# Patient Record
Sex: Male | Born: 1945 | Race: White | Hispanic: No | Marital: Married | Smoking: Former smoker
Health system: Southern US, Community
[De-identification: ages and names within clinical notes are randomized; demographics above are authoritative.]

## PROBLEM LIST (undated history)

## (undated) DIAGNOSIS — M1990 Unspecified osteoarthritis, unspecified site: Secondary | ICD-10-CM

## (undated) DIAGNOSIS — K589 Irritable bowel syndrome without diarrhea: Secondary | ICD-10-CM

## (undated) DIAGNOSIS — M419 Scoliosis, unspecified: Secondary | ICD-10-CM

## (undated) DIAGNOSIS — N400 Enlarged prostate without lower urinary tract symptoms: Secondary | ICD-10-CM

## (undated) DIAGNOSIS — M4800 Spinal stenosis, site unspecified: Secondary | ICD-10-CM

## (undated) DIAGNOSIS — I10 Essential (primary) hypertension: Secondary | ICD-10-CM

## (undated) DIAGNOSIS — C49A0 Gastrointestinal stromal tumor, unspecified site: Secondary | ICD-10-CM

## (undated) DIAGNOSIS — G709 Myoneural disorder, unspecified: Secondary | ICD-10-CM

## (undated) DIAGNOSIS — Z981 Arthrodesis status: Secondary | ICD-10-CM

## (undated) DIAGNOSIS — C49A4 Gastrointestinal stromal tumor of large intestine: Secondary | ICD-10-CM

## (undated) DIAGNOSIS — R7303 Prediabetes: Secondary | ICD-10-CM

## (undated) DIAGNOSIS — K7689 Other specified diseases of liver: Secondary | ICD-10-CM

## (undated) DIAGNOSIS — Z8371 Family history of colonic polyps: Secondary | ICD-10-CM

## (undated) DIAGNOSIS — F32A Depression, unspecified: Secondary | ICD-10-CM

## (undated) DIAGNOSIS — F329 Major depressive disorder, single episode, unspecified: Secondary | ICD-10-CM

## (undated) DIAGNOSIS — Z8739 Personal history of other diseases of the musculoskeletal system and connective tissue: Secondary | ICD-10-CM

## (undated) DIAGNOSIS — M5136 Other intervertebral disc degeneration, lumbar region: Secondary | ICD-10-CM

## (undated) DIAGNOSIS — F419 Anxiety disorder, unspecified: Secondary | ICD-10-CM

## (undated) DIAGNOSIS — Z83719 Family history of colon polyps, unspecified: Secondary | ICD-10-CM

## (undated) DIAGNOSIS — K5792 Diverticulitis of intestine, part unspecified, without perforation or abscess without bleeding: Secondary | ICD-10-CM

## (undated) DIAGNOSIS — Q613 Polycystic kidney, unspecified: Secondary | ICD-10-CM

## (undated) DIAGNOSIS — M48061 Spinal stenosis, lumbar region without neurogenic claudication: Secondary | ICD-10-CM

## (undated) DIAGNOSIS — J301 Allergic rhinitis due to pollen: Secondary | ICD-10-CM

## (undated) DIAGNOSIS — M51369 Other intervertebral disc degeneration, lumbar region without mention of lumbar back pain or lower extremity pain: Secondary | ICD-10-CM

## (undated) HISTORY — DX: Benign prostatic hyperplasia without lower urinary tract symptoms: N40.0

## (undated) HISTORY — DX: Essential (primary) hypertension: I10

## (undated) HISTORY — PX: GASTROSCOPY: WVUENDOPRO49

## (undated) HISTORY — PX: HX UPPER ENDOSCOPY: 2100001144

## (undated) HISTORY — PX: COLONOSCOPY: WVUENDOPRO10

## (undated) HISTORY — DX: Scoliosis, unspecified: M41.9

## (undated) HISTORY — DX: Unspecified osteoarthritis, unspecified site: M19.90

## (undated) HISTORY — DX: Spinal stenosis, site unspecified: M48.00

## (undated) HISTORY — DX: Irritable bowel syndrome without diarrhea: K58.9

## (undated) HISTORY — PX: HX KNEE ARTHROSCOPY: SHX127

## (undated) HISTORY — PX: TUMOR REMOVAL: SHX12

## (undated) HISTORY — DX: Anxiety disorder, unspecified: F41.9

## (undated) HISTORY — DX: Arthrodesis status: Z98.1

## (undated) HISTORY — DX: Gastrointestinal stromal tumor of large intestine: C49.A4

## (undated) HISTORY — PX: KNEE ARTHROCENTESIS: SUR44

## (undated) HISTORY — DX: Family history of colonic polyps: Z83.71

## (undated) HISTORY — DX: Myoneural disorder, unspecified: G70.9

## (undated) HISTORY — DX: Personal history of other diseases of the musculoskeletal system and connective tissue: Z87.39

## (undated) HISTORY — DX: Prediabetes: R73.03

## (undated) HISTORY — DX: Diverticulitis of intestine, part unspecified, without perforation or abscess without bleeding: K57.92

## (undated) HISTORY — DX: Polycystic kidney, unspecified: Q61.3

## (undated) HISTORY — DX: Other intervertebral disc degeneration, lumbar region without mention of lumbar back pain or lower extremity pain: M51.369

## (undated) HISTORY — DX: Depression, unspecified: F32.A

## (undated) HISTORY — DX: Family history of colon polyps, unspecified: Z83.719

## (undated) HISTORY — DX: Major depressive disorder, single episode, unspecified: F32.9

## (undated) HISTORY — DX: Gastrointestinal stromal tumor, unspecified site: C49.A0

## (undated) HISTORY — DX: Other intervertebral disc degeneration, lumbar region: M51.36

## (undated) HISTORY — DX: Allergic rhinitis due to pollen: J30.1

## (undated) HISTORY — PX: PARTIAL GASTRECTOMY: SHX2172

## (undated) HISTORY — DX: Spinal stenosis, lumbar region without neurogenic claudication: M48.061

## (undated) HISTORY — DX: Other specified diseases of liver: K76.89

---

## 2011-09-10 ENCOUNTER — Encounter: Payer: Self-pay | Admitting: Rehabilitation

## 2011-09-21 ENCOUNTER — Encounter: Payer: Self-pay | Admitting: Rehabilitation

## 2013-10-21 HISTORY — PX: SPLENECTOMY: SUR1306

## 2013-10-21 HISTORY — PX: REMOVAL OF GASTROINTESTINAL STOMATIC  TUMOR OF STOMACH: SHX6339

## 2013-10-21 HISTORY — PX: OTHER SURGICAL HISTORY: SHX169

## 2013-10-21 HISTORY — PX: PANCREAS SURGERY: SHX731

## 2013-10-26 ENCOUNTER — Encounter (INDEPENDENT_AMBULATORY_CARE_PROVIDER_SITE_OTHER): Payer: Self-pay | Admitting: GENERAL SURGERY

## 2013-10-27 ENCOUNTER — Ambulatory Visit (INDEPENDENT_AMBULATORY_CARE_PROVIDER_SITE_OTHER): Payer: Medicare Other | Admitting: GENERAL SURGERY

## 2013-10-27 ENCOUNTER — Encounter (INDEPENDENT_AMBULATORY_CARE_PROVIDER_SITE_OTHER): Payer: Self-pay | Admitting: GENERAL SURGERY

## 2013-10-27 VITALS — BP 145/90 | HR 64 | Temp 97.8°F | Ht 74.0 in | Wt 201.2 lb

## 2013-10-27 DIAGNOSIS — D3A8 Other benign neuroendocrine tumors: Secondary | ICD-10-CM

## 2013-10-27 DIAGNOSIS — K8689 Other specified diseases of pancreas: Secondary | ICD-10-CM

## 2013-10-27 DIAGNOSIS — D3A098 Benign carcinoid tumors of other sites: Secondary | ICD-10-CM

## 2013-10-27 NOTE — Patient Instructions (Signed)
Call if needed

## 2013-10-27 NOTE — Progress Notes (Signed)
[facility]  Department of Surgery    Date:   10/27/2013  Name: Steve Hopkins  Age: 68 y.o.    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 Has not lost any wt. 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    Past Medical History  Past Medical History   Diagnosis Date    Hypertension     BPH (benign prostatic hypertrophy)     Osteoarthritis     Scoliosis     Spinal stenosis     IBS (irritable bowel syndrome)     Past Surgical History   Procedure Laterality Date    Colonoscopy      Gastroscopy      Hx upper endoscopy      Hx knee arthroscopy       Right.        Current Outpatient Prescriptions   Medication Sig    acetaminophen (TYLENOL) 325 mg Oral Tablet Take 325 mg by mouth Takes 2 tablets every six hours p.r.n.    busPIRone (BUSPAR) 5 mg Oral Tablet Take 5 mg by mouth Twice daily    clonazePAM (KLONOPIN) 1 mg Oral Tablet Take 1 mg by mouth Twice daily    dicyclomine (BENTYL) 10 mg Oral Capsule Take 10 mg by mouth Twice daily    Fish Oil-DHA-EPA 1,200-144-216 mg Oral Capsule Take by mouth Once a day    losartan (COZAAR) 100 mg Oral Tablet Take 100 mg by mouth Once a day    metoprolol succinate (TOPROL-XL) 50 mg Oral Tablet Sustained Release 24 hr Take 50 mg by mouth Once a day    omeprazole (PRILOSEC) 20 mg Oral Capsule, Delayed Release(E.C.) Take 20 mg by mouth Once a day     No Known Allergies    Family History  Family History   Problem Relation Age of Onset    Lymphoma Mother     Macular Degen Father     Arthritis-osteo Father      Social History  History   Substance Use Topics    Smoking status: Current Every Day Smoker -- 12 years     Types: Pipe    Smokeless tobacco: Not on file    Alcohol Use: Not on file      Review of Systems  Other than ROS in the HPI, all other systems were negative.    Examination:  BP 145/90   Pulse 64   Temp(Src) 36.6 C (97.8 F)   Ht 1.88 m (6\' 2" )   Wt 91.264 kg  (201 lb 3.2 oz)   BMI 25.82 kg/m2  General: appears in good health  Eyes: Conjunctiva clear.  HENT:Head atraumatic and normocephalic  Neck: No JVD or thyromegaly or lymphadenopathy  Lungs: Clear to auscultation bilaterally.   Cardiovascular: regular rate and rhythm  Abdomen: Soft, non-tender, Bowel sounds normal  Genito-urinary: Deferred  Extremities: No synovitis or joint effusions  Skin: Skin warm and dry  Neurologic: Grossly normal  Lymphatics: No lymphadenopathy  Psychiatric: Normal, Anxious    Data reviewed:    Assessment and Plan  1. Pancreatic mass    2. Neuroendocrine tumor of pancreas      Will plan laparoscopic distal pancreatectomy/splenectomy, poss open. Pt agreeable.    Return if symptoms worsen or fail to improve.    Starla Link, MD

## 2013-11-13 DIAGNOSIS — S3630XA Unspecified injury of stomach, initial encounter: Secondary | ICD-10-CM

## 2013-11-13 DIAGNOSIS — C252 Malignant neoplasm of tail of pancreas: Secondary | ICD-10-CM

## 2013-11-13 DIAGNOSIS — D214 Benign neoplasm of connective and other soft tissue of abdomen: Secondary | ICD-10-CM

## 2013-11-13 HISTORY — PX: HX OTHER: 2100001105

## 2013-11-13 HISTORY — PX: HX SPLENECTOMY: SHX152

## 2013-11-18 ENCOUNTER — Encounter (INDEPENDENT_AMBULATORY_CARE_PROVIDER_SITE_OTHER): Payer: Self-pay | Admitting: GENERAL SURGERY

## 2013-11-24 ENCOUNTER — Encounter (INDEPENDENT_AMBULATORY_CARE_PROVIDER_SITE_OTHER): Payer: Self-pay | Admitting: GENERAL SURGERY

## 2013-11-24 ENCOUNTER — Ambulatory Visit (INDEPENDENT_AMBULATORY_CARE_PROVIDER_SITE_OTHER): Payer: Medicare Other | Admitting: GENERAL SURGERY

## 2013-11-24 VITALS — BP 133/84 | HR 89 | Temp 97.0°F | Ht 74.0 in | Wt 194.8 lb

## 2013-11-24 DIAGNOSIS — D3A8 Other benign neuroendocrine tumors: Secondary | ICD-10-CM

## 2013-11-24 DIAGNOSIS — Z09 Encounter for follow-up examination after completed treatment for conditions other than malignant neoplasm: Secondary | ICD-10-CM

## 2013-11-24 DIAGNOSIS — C49A2 Gastrointestinal stromal tumor of stomach: Secondary | ICD-10-CM

## 2013-11-24 NOTE — Progress Notes (Signed)
[facility] of Surgery    Date:   11/24/2013  Name: Steve Hopkins  Age: 68 y.o.    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 He is eating a regular diet without difficulty. Bowel movement are Normal.  The patient is not having any pain..    Objective:  BP 133/84    Pulse 89    Temp(Src) 36.1 C (97 F)    Ht 1.88 m (6\' 2" )    Wt 88.361 kg (194 lb 12.8 oz)    BMI 25.00 kg/m2     General:  alert, cooperative, no distress, appears stated age  Abdomen: soft, bowel sounds active, non-tender, drain in place with some fistulous appearing drainage (40cc a day)  Incision:  healing well, no drainage, no erythema    Assessment/Plan:  Doing well postoperatively.  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 Path of both tumors was benign.  Activity: RTC 1 week to remove drain if possible.        Starla Link, MD

## 2013-11-24 NOTE — Patient Instructions (Signed)
Call if needed

## 2013-11-26 ENCOUNTER — Encounter (INDEPENDENT_AMBULATORY_CARE_PROVIDER_SITE_OTHER): Payer: Self-pay | Admitting: GENERAL SURGERY

## 2013-12-02 ENCOUNTER — Ambulatory Visit (INDEPENDENT_AMBULATORY_CARE_PROVIDER_SITE_OTHER): Payer: Medicare Other | Admitting: GENERAL SURGERY

## 2013-12-02 ENCOUNTER — Encounter (INDEPENDENT_AMBULATORY_CARE_PROVIDER_SITE_OTHER): Payer: Self-pay | Admitting: GENERAL SURGERY

## 2013-12-02 VITALS — BP 131/88 | HR 77 | Temp 97.7°F | Ht 74.0 in | Wt 195.2 lb

## 2013-12-02 DIAGNOSIS — Z09 Encounter for follow-up examination after completed treatment for conditions other than malignant neoplasm: Secondary | ICD-10-CM

## 2013-12-02 DIAGNOSIS — K8689 Other specified diseases of pancreas: Secondary | ICD-10-CM

## 2013-12-02 NOTE — Progress Notes (Signed)
[facility]  Department of Surgery    Date:   12/02/2013  Name: Steve Hopkins  Age: 68 y.o.    Subjective:  Steve Hopkins presents 2 weeks following a lap distal pancreatectomy w pancreatic fistula. He is eating a regular diet without difficulty. Bowel movement are Normal.  The patient is not having any pain. 20cc a day drainage.    Objective:  BP 131/88    Pulse 77    Temp(Src) 36.5 C (97.7 F)    Ht 1.88 m (6\' 2" )    Wt 88.542 kg (195 lb 3.2 oz)    BMI 25.05 kg/m2     General:  alert, cooperative, no distress, appears stated age  Abdomen: soft, bowel sounds active, non-tender, no hernias  Incision:  healing well, no drainage, no erythema    Assessment/Plan:  Doing well postoperatively.  Postoperative course complicated by fistula which appears to be closing  Activity: FU 2 weeks.        Starla Link, MD

## 2013-12-02 NOTE — Patient Instructions (Signed)
F/U 2 weeks

## 2013-12-16 ENCOUNTER — Encounter (INDEPENDENT_AMBULATORY_CARE_PROVIDER_SITE_OTHER): Payer: Medicare Other | Admitting: GENERAL SURGERY

## 2013-12-16 DIAGNOSIS — D3A8 Other benign neuroendocrine tumors: Secondary | ICD-10-CM | POA: Insufficient documentation

## 2013-12-16 DIAGNOSIS — K8689 Other specified diseases of pancreas: Secondary | ICD-10-CM | POA: Insufficient documentation

## 2013-12-16 DIAGNOSIS — C49A0 Gastrointestinal stromal tumor, unspecified site: Secondary | ICD-10-CM | POA: Insufficient documentation

## 2014-01-06 ENCOUNTER — Encounter (INDEPENDENT_AMBULATORY_CARE_PROVIDER_SITE_OTHER): Payer: Self-pay | Admitting: GENERAL SURGERY

## 2014-01-06 ENCOUNTER — Ambulatory Visit (INDEPENDENT_AMBULATORY_CARE_PROVIDER_SITE_OTHER): Payer: Medicare Other | Admitting: GENERAL SURGERY

## 2014-01-06 VITALS — BP 144/96 | HR 76 | Temp 97.1°F | Ht 72.0 in | Wt 195.8 lb

## 2014-01-06 DIAGNOSIS — Z09 Encounter for follow-up examination after completed treatment for conditions other than malignant neoplasm: Secondary | ICD-10-CM

## 2014-01-06 DIAGNOSIS — K8689 Other specified diseases of pancreas: Secondary | ICD-10-CM

## 2014-01-06 NOTE — Patient Instructions (Signed)
Call if needed

## 2014-01-06 NOTE — Progress Notes (Signed)
[name]    Date:   01/06/2014  Name: Steve Hopkins  Age: 68 y.o.    Subjective:  Steve Hopkins presents 7 weeks following a distal pancreatectomy. He is eating a regular diet without difficulty. Bowel movement are Normal.  The patient is not having any pain. Drain output 5cc over 30 hr    Objective:  BP 144/96    Pulse 76    Temp(Src) 36.2 C (97.1 F)    Ht 1.829 m (6')    Wt 88.814 kg (195 lb 12.8 oz)    BMI 26.55 kg/m2     General:  alert, cooperative, no distress, appears stated age  Abdomen: soft, bowel sounds active, non-tender  Incision:  healing well, no drainage, no erythema    Assessment/Plan:  Doing well postoperatively.  Activity: Drain removed   Return if symptoms worsen or fail to improve.     Starla Link, MD

## 2014-03-12 ENCOUNTER — Encounter (INDEPENDENT_AMBULATORY_CARE_PROVIDER_SITE_OTHER): Payer: Self-pay | Admitting: GENERAL SURGERY

## 2014-03-12 ENCOUNTER — Ambulatory Visit (INDEPENDENT_AMBULATORY_CARE_PROVIDER_SITE_OTHER): Payer: Medicare Other | Admitting: GENERAL SURGERY

## 2014-03-12 VITALS — BP 126/79 | HR 68 | Temp 98.6°F | Ht 73.0 in | Wt 201.8 lb

## 2014-03-12 DIAGNOSIS — R1900 Intra-abdominal and pelvic swelling, mass and lump, unspecified site: Principal | ICD-10-CM

## 2014-03-12 NOTE — Patient Instructions (Signed)
Call after CT

## 2014-03-12 NOTE — Progress Notes (Signed)
[facility]  Department of Surgery    Date:   03/12/2014  Name: Steve Hopkins  Age: 67 y.o.    Chief Complaint: Hernia    Subjective:  Pt returns 4 months after lap converted to open distal pancreatectomy and wedge resection of gastric tumor. C/O abdominal bulge and fears it is an incisional hernia  Objective:  BP 126/79 mmHg   Pulse 68   Temp(Src) 37 C (98.6 F)   Ht 1.854 m (6\' 1" )   Wt 91.536 kg (201 lb 12.8 oz)   BMI 26.63 kg/m2  HEENT- PERRLA, Throat Clear, Ears clear EOM intact, sclerae clear   Neck - Supple, no thyromegaly or carotid bruits  Lungs-Clear to auscultation and percussion, no wheezes or rhonchi  Heart- RR without murmur, no rub , no gallop s1s2 no s3 or s4  Abdomen- Soft, Non tender, positive bowel sounds no rebound or guarding, some eventration of incision noted, but no fascial defect noted.  Neuro- CN 2-12 grossly intact, DTR's 2/4 throughout upper and lower extremities, gait normal. Muscle strength 5/5 throughout Upper an lower extremities  Extremities-no cyanosis clubbing or edema  Skin- Clear, no unusual or changing nevi, no rash, no acne    Data reviewed:    Assessment and Plan  1. Abdominal wall bulge      Will get CT to clarify picture. Stable at this time. May be eventration due to transection of rectus/obliques.         Starla Link, MD

## 2014-03-26 ENCOUNTER — Telehealth (INDEPENDENT_AMBULATORY_CARE_PROVIDER_SITE_OTHER): Payer: Self-pay | Admitting: GENERAL SURGERY

## 2014-07-13 ENCOUNTER — Telehealth (INDEPENDENT_AMBULATORY_CARE_PROVIDER_SITE_OTHER): Payer: Self-pay | Admitting: GENERAL SURGERY

## 2014-07-13 NOTE — Telephone Encounter (Signed)
PT CALLED SAID HE SAW DR. Jodi Mourning AND THAT HE MOST DEF HAS A HERNIA. THAT WE COULDN'T SEE A HERNIA ON A CT BECAUSE HE WAS LAYING DOWN. I CALLED DR. RICHMOND, HE SAID FOR HIM TO HAVE A HERNIA HE WOULD HAVE TO HAVE A OPENING IN A THE ABDOMINAL WALL, BUT THE CT SHOWED LOOSE MUSCLE BECAUSE OF THE NERVES THAT WAS CUT DUE TO HAVING A SURGERY IN July. I CALLED THE PT BACK HE SAID THAT HE WANTED DR. RICHMOND AND DR. HARPER TOGETHER TO TALK ABOUT THIS ABDOMEN. I ASKED THE PT IF HE WANTED TO COME IN TO SEE [place] THE PT DECLINED HE WANTS DR. RICHMOND TO CALLED DR. HARPER.

## 2014-07-28 ENCOUNTER — Encounter (INDEPENDENT_AMBULATORY_CARE_PROVIDER_SITE_OTHER): Payer: Medicare Other | Admitting: GENERAL SURGERY

## 2014-08-12 ENCOUNTER — Encounter (INDEPENDENT_AMBULATORY_CARE_PROVIDER_SITE_OTHER): Payer: Medicare Other | Admitting: GENERAL SURGERY

## 2014-08-13 ENCOUNTER — Encounter (INDEPENDENT_AMBULATORY_CARE_PROVIDER_SITE_OTHER): Payer: Medicare Other | Admitting: GENERAL SURGERY

## 2014-08-23 ENCOUNTER — Encounter (INDEPENDENT_AMBULATORY_CARE_PROVIDER_SITE_OTHER): Payer: Self-pay | Admitting: GENERAL SURGERY

## 2014-08-23 ENCOUNTER — Ambulatory Visit (INDEPENDENT_AMBULATORY_CARE_PROVIDER_SITE_OTHER): Payer: Medicare Other | Admitting: GENERAL SURGERY

## 2014-08-23 VITALS — BP 122/83 | HR 66 | Temp 97.7°F | Ht 72.0 in | Wt 214.8 lb

## 2014-08-23 DIAGNOSIS — K432 Incisional hernia without obstruction or gangrene: Principal | ICD-10-CM

## 2014-08-23 NOTE — Patient Instructions (Signed)
Call if needed

## 2014-08-23 NOTE — Progress Notes (Signed)
[facility]  Department of Surgery    Date:   08/23/2014  Name: Steve Hopkins  Age: 69 y.o.    Chief Complaint: Abdominal Pain    Subjective:  Pt presents for re-evaluation. Concerned he has developed herina in meantime.  Objective:  BP 122/83 mmHg   Pulse 66   Temp(Src) 36.5 C (97.7 F)   Ht 1.829 m (6')   Wt 97.433 kg (214 lb 12.8 oz)   BMI 29.13 kg/m2  HEENT- PERRLA, Throat Clear, Ears clear EOM intact, sclerae clear   Neck - Supple, no thyromegaly or carotid bruits  Lungs-Clear to auscultation and percussion, no wheezes or rhonchi  Heart- RR without murmur, no rub , no gallop s1s2 no s3 or s4  Abdomen- Soft, Non tender, positive bowel sounds no rebound or guarding, 3x3cm reducible defect in LUQ incision  Neuro- CN 2-12 grossly intact, DTR's 2/4 throughout upper and lower extremities, gait normal. Muscle strength 5/5 throughout Upper an lower extremities  Extremities-no cyanosis clubbing or edema  Skin- Clear, no unusual or changing nevi, no rash, no acne      Data reviewed:    Assessment and Plan  1. Incisional hernia, without obstruction or gangrene      Pt agreeable tp repair, but would like to wait a while. He will let me know when he can proceed.         Starla Link, MD

## 2014-08-27 ENCOUNTER — Encounter (INDEPENDENT_AMBULATORY_CARE_PROVIDER_SITE_OTHER): Payer: Medicare Other | Admitting: GENERAL SURGERY

## 2015-10-12 ENCOUNTER — Ambulatory Visit (HOSPITAL_COMMUNITY): Payer: Self-pay | Admitting: Internal Medicine

## 2016-10-10 ENCOUNTER — Encounter: Payer: Self-pay | Admitting: Primary Care

## 2016-10-10 ENCOUNTER — Telehealth: Payer: Self-pay

## 2016-10-10 ENCOUNTER — Ambulatory Visit (INDEPENDENT_AMBULATORY_CARE_PROVIDER_SITE_OTHER): Payer: Medicare Other | Admitting: Primary Care

## 2016-10-10 ENCOUNTER — Other Ambulatory Visit: Payer: Self-pay

## 2016-10-10 VITALS — BP 110/80 | HR 69 | Temp 97.6°F | Ht 73.0 in | Wt 200.2 lb

## 2016-10-10 DIAGNOSIS — Z86010 Personal history of colonic polyps: Secondary | ICD-10-CM

## 2016-10-10 DIAGNOSIS — F411 Generalized anxiety disorder: Secondary | ICD-10-CM

## 2016-10-10 DIAGNOSIS — G8929 Other chronic pain: Secondary | ICD-10-CM | POA: Diagnosis not present

## 2016-10-10 DIAGNOSIS — Z1211 Encounter for screening for malignant neoplasm of colon: Secondary | ICD-10-CM

## 2016-10-10 DIAGNOSIS — M5440 Lumbago with sciatica, unspecified side: Secondary | ICD-10-CM

## 2016-10-10 DIAGNOSIS — I10 Essential (primary) hypertension: Secondary | ICD-10-CM | POA: Diagnosis not present

## 2016-10-10 MED ORDER — VALSARTAN 80 MG PO TABS: 80.0000 mg | ORAL_TABLET | Freq: Every day | ORAL | 0 refills | Status: DC

## 2016-10-10 MED ORDER — DICLOFENAC SODIUM 75 MG PO TBEC: 75.0000 mg | DELAYED_RELEASE_TABLET | Freq: Two times a day (BID) | ORAL | 1 refills | Status: DC

## 2016-10-10 NOTE — Progress Notes (Signed)
Subjective:    Patient ID: Bobby Little, male    DOB: [birth year], 71 y.o.   MRN: [id number]  HPI  Mr. Bobby Little is a 71 year old male who presents today to establish care and discuss the problems mentioned below. Will obtain old records. He is overdue for colonoscopy. Last colonoscopy was in 2011 with precancerous polyps. He was to return three years later but has not done so.  1) Essential Hypertension: Diagnosed in his 67's. Currently managed on Azilsartan 40 mg. He checks his BP at home and gets readings in the 120's-130's/70's-80's. He denies chest pain, dizziness. The medication is becoming too costly and would like a cheaper alternative.   2) GAD: Long history of anxiety and panic attacks. Currently managed on Clonazepam 1 mg twice daily for the past 12 years. He doesn't feel as though he really has anxiety now, but has just been taking it because he's always taken it. Previously managed on Valium, Ativan, Lexapro, Effexor, Zoloft in the past. The Lexapro caused him to feel horrible. He does have difficulty with sleeping. He will fall asleep within 10 minutes of getting in bed but will wake up at 2 am to 4 am and is unable to fall back asleep. This has been problematic for the past 1 year. Before retiring he would get up for work at 4:30 am. He's not recently tried Melatonin.  3) Chronic Low Pain: Located to the mid lower back that has been bothersome for years. He underwent MRI in [place]. Doctors in [place] told him that he had a "slipped notch" that may require surgery. His pain will intermittently radiate to his lower extremities and will also experience numbness. He is currently taking Ibuprofen without improvement. He would like to see a spine specialist and has his most recent MRI on a disc.  Review of Systems  Eyes: Negative for visual disturbance.  Respiratory: Negative for shortness of breath.   Cardiovascular: Negative for chest pain.  Musculoskeletal: Positive for arthralgias and  back pain.  Neurological: Negative for dizziness.  Psychiatric/Behavioral: Positive for sleep disturbance. The patient is not nervous/anxious.        Past Medical History:  Diagnosis Date  . Arthritis   . Depression   . Diverticulitis   . Family history of polyps in the colon   . Hay fever/allergies   . History of spinal fusion for scoliosis   . Hypertension      Social History   Social History  . Marital status: Married    Spouse name: N/A  . Number of children: N/A  . Years of education: N/A   Occupational History  . Not on file.   Social History Main Topics  . Smoking status: Current Every Day Smoker    Types: Cigars  . Smokeless tobacco: Never Used  . Alcohol use No  . Drug use: Unknown  . Sexual activity: Not on file   Other Topics Concern  . Not on file   Social History Narrative  . No narrative on file    Past Surgical History:  Procedure Laterality Date  . KNEE ARTHROCENTESIS Right   . TUMOR REMOVAL     stomach, slpeen, and pancreas    Family History  Problem Relation Age of Onset  . Hypertension Mother   . Lymphoma Mother   . Hypertension Father   . Valvular heart disease Father   . Lymphoma Maternal Grandmother   . Stroke Maternal Grandfather   . Prostate cancer Paternal Grandfather  Allergies not on file  No current outpatient prescriptions on file prior to visit.   No current facility-administered medications on file prior to visit.     BP 110/80   Pulse 69   Temp 97.6 F (36.4 C)   Ht 6\' 1"  (1.854 m)   Wt 200 lb 4 oz (90.8 kg)   SpO2 97%   BMI 26.42 kg/m    Objective:   Physical Exam  Constitutional: He is oriented to person, place, and time. He appears well-nourished.  Neck: Neck supple.  Cardiovascular: Normal rate and regular rhythm.   Pulmonary/Chest: Effort normal and breath sounds normal. He has no wheezes. He has no rales.  Musculoskeletal: Normal range of motion.  No difficulty with gait.  Neurological: He  is alert and oriented to person, place, and time.  Skin: Skin is warm and dry.  Psychiatric: He has a normal mood and affect.          Assessment & Plan:

## 2016-10-10 NOTE — Progress Notes (Signed)
Pre visit review using our clinic review tool, if applicable. No additional management support is needed unless otherwise documented below in the visit note. 

## 2016-10-10 NOTE — Assessment & Plan Note (Signed)
Long history. Also with spinal stenosis.  Will send to spine specialist for further evaluation. Will have him take his MRI scans to his appointment. Rx for Diclofenac sent to pharmacy to use PRN.

## 2016-10-10 NOTE — Assessment & Plan Note (Signed)
Stable today. Will switch to valsartan as this is likely more affordable. He will continue to monitor blood pressure and notify if readings are too low or above goal. Will obtain records for recent BMP.

## 2016-10-10 NOTE — Patient Instructions (Addendum)
Please wean off your Clonazepam tablets.  Start taking 1/2 tablet by mouth twice daily for 1 week, then take 1/2 tablet by mouth once daily for 1 week, then stop. Please update me if you have any problems on the reduced dose and/or weaning off.  I sent a medication called valsartan 80 mg to your pharmacy for high blood pressure. You may transition to this medication once you complete your current blood pressure medication. Keep track of your blood pressure on this new medication.  Stop by the front desk and speak with either Rosaria Ferries or Robin regarding your referral to the [facility].   I sent a prescription for Diclofenac tablets to your pharmacy for pain. Take 1 tablet by mouth twice daily as needed for pain.  You will be contacted regarding your referral to GI for the colonoscopy.  Please let us know if you have not heard back within one week.   It was a pleasure to meet you today! Please don't hesitate to call me with any questions. Welcome to [facility]!

## 2016-10-10 NOTE — Telephone Encounter (Signed)
Gastroenterology Pre-Procedure Review  Request Date: 10/23/16 Requesting Physician: Dr. Vicente Males  PATIENT REVIEW QUESTIONS: The patient responded to the following health history questions as indicated:    1. Are you having any GI issues? yes (IBS) 2. Do you have a personal history of Polyps? yes (removed) 3. Do you have a family history of Colon Cancer or Polyps? no 4. Diabetes Mellitus? no 5. Joint replacements in the past 12 months?no 6. Major health problems in the past 3 months?no 7. Any artificial heart valves, MVP, or defibrillator?no    MEDICATIONS & ALLERGIES:    Patient reports the following regarding taking any anticoagulation/antiplatelet therapy:   Plavix, Coumadin, Eliquis, Xarelto, Lovenox, Pradaxa, Brilinta, or Effient? no Aspirin? no  Patient confirms/reports the following medications:  Current Outpatient Prescriptions  Medication Sig Dispense Refill  . tadalafil (CIALIS) 5 MG tablet Take 5 mg by mouth.    Marland Kitchen acetaminophen (TYLENOL) 500 MG tablet Take 500 mg by mouth.    . diclofenac (VOLTAREN) 75 MG EC tablet Take 1 tablet (75 mg total) by mouth 2 (two) times daily. 60 tablet 1  . losartan (COZAAR) 100 MG tablet Take by mouth.    . metoprolol succinate (TOPROL-XL) 50 MG 24 hr tablet Take 50 mg by mouth.    Marland Kitchen omeprazole (PRILOSEC) 20 MG capsule Take 20 mg by mouth.    . Omeprazole Magnesium 2.5 MG PACK Take by mouth.    . valsartan (DIOVAN) 80 MG tablet Take 1 tablet (80 mg total) by mouth daily. 90 tablet 0   No current facility-administered medications for this visit.     Patient confirms/reports the following allergies:  Allergies  Allergen Reactions  . Beta Adrenergic Blockers     Dizziness, low heart rate  . Codeine Nausea And Vomiting    No orders of the defined types were placed in this encounter.   AUTHORIZATION INFORMATION Primary Insurance: 1D#: Group #:  Secondary Insurance: 1D#: Group #:  SCHEDULE INFORMATION: Date: 10/23/16 Time: Location:  [facility]

## 2016-10-10 NOTE — Assessment & Plan Note (Signed)
Managed on Clonazepam BID x 12 years. Doesn't seem to struggle with anxiety overall. Will have him wean off over the next 2-3 weeks. He declines other treatment as he'd like to see how he does off of this medication. Will closely monitor.

## 2016-10-15 ENCOUNTER — Telehealth: Payer: Self-pay

## 2016-10-15 NOTE — Telephone Encounter (Signed)
Pt request meds changed from [place] to [place]. Pt will contact [facility] to have meds transferred.pt voiced understanding.

## 2016-10-16 ENCOUNTER — Telehealth: Payer: Self-pay | Admitting: Gastroenterology

## 2016-10-16 NOTE — Telephone Encounter (Signed)
Patient has to go out of town and will need to cancel his colonoscopy and will call back later to r/s.

## 2016-10-22 ENCOUNTER — Encounter: Payer: Self-pay | Admitting: Primary Care

## 2016-10-23 ENCOUNTER — Ambulatory Visit: Admission: RE | Admit: 2016-10-23 | Payer: Medicare Other | Source: Ambulatory Visit | Admitting: Gastroenterology

## 2016-10-23 ENCOUNTER — Encounter: Admission: RE | Payer: Self-pay | Source: Ambulatory Visit

## 2016-10-23 SURGERY — COLONOSCOPY WITH PROPOFOL
Anesthesia: General

## 2016-10-29 ENCOUNTER — Telehealth: Payer: Self-pay

## 2016-10-29 NOTE — Telephone Encounter (Signed)
He was not seen for a sinus infection on 10/10/16. When did his previous doctor give him amoxicillin for a sinus infection? If recently provided then would not recommend any additional antibiotics. If he was speaking about a prior experience then he will need to be evaluated in our office for his symptoms.

## 2016-10-29 NOTE — Telephone Encounter (Signed)
Patient notified as instructed by telephone and verbalized understanding. Patient stated that his previous doctor gave him an antibiotic back in January. Patient stated that he does not know if he has a sinus infection or not, but he is having vertigo and something may be going on with his ears. Appointment scheduled with you tomorrow to evaluate patient.

## 2016-10-29 NOTE — Telephone Encounter (Signed)
Pt left v/m; pt last seen 10/10/16; recently moved from [place]; doctor in [place] gave amoxicillin for sinus infection. Pt still has some sinus symptoms and pt request abx to [place]. Pt has to return to [place] for 3-4 days on 10/31/16. Pt request cb.

## 2016-10-29 NOTE — Telephone Encounter (Signed)
Noted  

## 2016-10-30 ENCOUNTER — Ambulatory Visit (INDEPENDENT_AMBULATORY_CARE_PROVIDER_SITE_OTHER): Payer: Medicare Other | Admitting: Primary Care

## 2016-10-30 ENCOUNTER — Encounter: Payer: Self-pay | Admitting: Primary Care

## 2016-10-30 VITALS — BP 124/80 | HR 57 | Temp 98.0°F | Ht 73.0 in | Wt 204.8 lb

## 2016-10-30 DIAGNOSIS — K589 Irritable bowel syndrome without diarrhea: Secondary | ICD-10-CM

## 2016-10-30 DIAGNOSIS — G4700 Insomnia, unspecified: Secondary | ICD-10-CM | POA: Diagnosis not present

## 2016-10-30 MED ORDER — DICYCLOMINE HCL 20 MG PO TABS: 20.0000 mg | ORAL_TABLET | Freq: Three times a day (TID) | ORAL | 0 refills | Status: DC

## 2016-10-30 MED ORDER — TRAZODONE HCL 50 MG PO TABS: 50.0000 mg | ORAL_TABLET | Freq: Every evening | ORAL | 0 refills | Status: DC | PRN

## 2016-10-30 NOTE — Patient Instructions (Signed)
Nasal Congestion/Sinus Pressure: Try using Flonase (fluticasone) nasal spray. Instill 1 spray in each nostril twice daily.   Try taking Zyrtec at bedtime to help with sleep and allergy symptoms.  Try Trazodone tablets at bedtime as needed for difficulty sleeping. Start with one tablet, may increase to 2 tablets if needed. Please keep me updated regarding your sleep.  You may try Meclizine 25 mg tablets as needed for vertigo. This may be purchased over the counter.  It was a pleasure to see you today!

## 2016-10-30 NOTE — Progress Notes (Signed)
Pre visit review using our clinic review tool, if applicable. No additional management support is needed unless otherwise documented below in the visit note. 

## 2016-10-30 NOTE — Assessment & Plan Note (Signed)
Having difficulty weaning off Clonazepam and is not sleeping well. Discussed the addictive nature of this medication and potential long term side effects. Will have him try Trazodone HS PRN.  He will update in several weeks if no improvement.

## 2016-10-30 NOTE — Progress Notes (Signed)
Subjective:    Patient ID: Bobby Little, male    DOB: 22-Aug-1945, 71 y.o.   MRN: [id number]  HPI  Bobby Little is a 71 year old male who presents today with multiple complaints.  1) Dizziness: He also reports headaches that are located to the left temporal and frontal region, nasal congestion.  He was driving Saturday and noticed feeling as though everything was spinning around him. This is when his other symptoms began. He denies cough, fevers, sinus. He's been taking Zyrtec every morning without much improvement. Overall he's feeling much better as his dizziness has nearly resolved. He does have a history of chronic allergic rhinitis and has used Flonase, Nasacort, etc without much improvement.   2) Insomnia: He's still struggling with sleeping and doesn't think he will be able to wean off of the Clonazepam. He is still working on weaning of the Clonazepam but had to increase his dose last night as he couldn't fall asleep. He is interested in trying something else for insomnia as he does understand the addictive nature of Clonazepam.   Review of Systems  Constitutional: Negative for chills, fatigue and fever.  HENT: Positive for sinus pressure. Negative for congestion.   Neurological: Positive for dizziness.  Psychiatric/Behavioral: Positive for sleep disturbance.       Past Medical History:  Diagnosis Date  . Arthritis   . Depression   . Diverticulitis   . Family history of polyps in the colon   . Hay fever/allergies   . History of spinal fusion for scoliosis   . Hypertension   . Polycystic kidney      Social History   Social History  . Marital status: Married    Spouse name: N/A  . Number of children: N/A  . Years of education: N/A   Occupational History  . Not on file.   Social History Main Topics  . Smoking status: Current Every Day Smoker    Types: Cigars  . Smokeless tobacco: Never Used  . Alcohol use No  . Drug use: Unknown  . Sexual activity: Not on file     Other Topics Concern  . Not on file   Social History Narrative  . No narrative on file    Past Surgical History:  Procedure Laterality Date  . KNEE ARTHROCENTESIS Right   . TUMOR REMOVAL     stomach, slpeen, and pancreas    Family History  Problem Relation Age of Onset  . Hypertension Mother   . Lymphoma Mother   . Hypertension Father   . Valvular heart disease Father   . Lymphoma Maternal Grandmother   . Stroke Maternal Grandfather   . Prostate cancer Paternal Grandfather     Allergies  Allergen Reactions  . Beta Adrenergic Blockers     Dizziness, low heart rate  . Codeine Nausea And Vomiting    Current Outpatient Prescriptions on File Prior to Visit  Medication Sig Dispense Refill  . acetaminophen (TYLENOL) 500 MG tablet Take 500 mg by mouth.    . diclofenac (VOLTAREN) 75 MG EC tablet Take 1 tablet (75 mg total) by mouth 2 (two) times daily. 60 tablet 1  . metoprolol succinate (TOPROL-XL) 50 MG 24 hr tablet Take 50 mg by mouth.    . valsartan (DIOVAN) 80 MG tablet Take 1 tablet (80 mg total) by mouth daily. 90 tablet 0   No current facility-administered medications on file prior to visit.     BP 124/80   Pulse (!) 57  Temp 98 F (36.7 C) (Oral)   Ht 6\' 1"  (1.854 m)   Wt 204 lb 12.8 oz (92.9 kg)   SpO2 98%   BMI 27.02 kg/m    Objective:   Physical Exam  Constitutional: He appears well-nourished.  HENT:  Right Ear: Tympanic membrane and ear canal normal.  Left Ear: Tympanic membrane and ear canal normal.  Nose: No mucosal edema. Right sinus exhibits no maxillary sinus tenderness and no frontal sinus tenderness. Left sinus exhibits no maxillary sinus tenderness and no frontal sinus tenderness.  Mouth/Throat: Oropharynx is clear and moist.  Eyes: Conjunctivae and EOM are normal. Pupils are equal, round, and reactive to light.  Neck: Neck supple.  Cardiovascular: Normal rate and regular rhythm.   Pulmonary/Chest: Effort normal and breath sounds  normal. He has no wheezes. He has no rales.  Neurological: No cranial nerve deficit.  Skin: Skin is warm and dry.          Assessment & Plan:  Dizziness:  Also with headache and nasal congestion. Symptoms consistent with Vertigo, likely an environmental trigger. Exam today unremarkable. Discussed to move Zyrtec to bedtime rather than during the day. Encouraged Flonase PRN for congestion. There is no bacterial involvement to HENT. Discussed use of Meclizine PRN for vertigo.  Follow up PRN.  Sheral Flow, NP

## 2016-11-05 ENCOUNTER — Encounter: Payer: Self-pay | Admitting: Primary Care

## 2016-11-05 ENCOUNTER — Telehealth: Payer: Self-pay | Admitting: Gastroenterology

## 2016-11-05 DIAGNOSIS — F411 Generalized anxiety disorder: Secondary | ICD-10-CM

## 2016-11-05 NOTE — Telephone Encounter (Signed)
PATIENT left a voice message regarding a colonoscopy date. He said that 5/3 was fine. Please call

## 2016-11-05 NOTE — Telephone Encounter (Signed)
Gave patient available dates for procedure in May.  Patient to callback with appropriate date due to son having to facilitate transportation.   Advised patient that under no circumstances can he drive himself home.

## 2016-11-05 NOTE — Telephone Encounter (Signed)
Patient left a voice message that he needs to schedule a colonoscopy. He had to cancel the last one due to work.

## 2016-11-06 ENCOUNTER — Other Ambulatory Visit: Payer: Self-pay

## 2016-11-06 DIAGNOSIS — K589 Irritable bowel syndrome without diarrhea: Secondary | ICD-10-CM

## 2016-11-06 MED ORDER — BUSPIRONE HCL 7.5 MG PO TABS
ORAL_TABLET | ORAL | 0 refills | Status: DC
Start: 2016-11-06 — End: 2016-12-13

## 2016-11-06 NOTE — Telephone Encounter (Signed)
Gastroenterology Pre-Procedure Review  Request Date: 5/3 Requesting Physician: Dr. Vicente Males  PATIENT REVIEW QUESTIONS: The patient responded to the following health history questions as indicated:    1. Are you having any GI issues? yes (IBS) 2. Do you have a personal history of Polyps? yes (polyps) 3. Do you have a family history of Colon Cancer or Polyps? no 4. Diabetes Mellitus? no 5. Joint replacements in the past 12 months?no 6. Major health problems in the past 3 months?no 7. Any artificial heart valves, MVP, or defibrillator? no    MEDICATIONS & ALLERGIES:    Patient reports the following regarding taking any anticoagulation/antiplatelet therapy:   Plavix, Coumadin, Eliquis, Xarelto, Lovenox, Pradaxa, Brilinta, or Effient? no Aspirin? no  Patient confirms/reports the following medications:  Current Outpatient Prescriptions  Medication Sig Dispense Refill  . acetaminophen (TYLENOL) 500 MG tablet Take 500 mg by mouth.    . cetirizine (ZYRTEC) 10 MG tablet Take 10 mg by mouth daily.    . diclofenac (VOLTAREN) 75 MG EC tablet Take 1 tablet (75 mg total) by mouth 2 (two) times daily. 60 tablet 1  . dicyclomine (BENTYL) 20 MG tablet Take 1 tablet (20 mg total) by mouth 4 (four) times daily -  before meals and at bedtime. 90 tablet 0  . metoprolol succinate (TOPROL-XL) 50 MG 24 hr tablet Take 50 mg by mouth.    . traZODone (DESYREL) 50 MG tablet Take 1-2 tablets (50-100 mg total) by mouth at bedtime as needed for sleep. 60 tablet 0  . valsartan (DIOVAN) 80 MG tablet Take 1 tablet (80 mg total) by mouth daily. 90 tablet 0   No current facility-administered medications for this visit.     Patient confirms/reports the following allergies:  Allergies  Allergen Reactions  . Beta Adrenergic Blockers     Dizziness, low heart rate  . Codeine Nausea And Vomiting    No orders of the defined types were placed in this encounter.   AUTHORIZATION INFORMATION Primary  Insurance: 1D#: Group #:  Secondary Insurance: 1D#: Group #:  SCHEDULE INFORMATION: Date:  5/3 Time: Location: [place]

## 2016-11-16 ENCOUNTER — Other Ambulatory Visit: Payer: Self-pay | Admitting: Primary Care

## 2016-11-16 DIAGNOSIS — I10 Essential (primary) hypertension: Secondary | ICD-10-CM

## 2016-11-19 ENCOUNTER — Other Ambulatory Visit: Payer: Self-pay | Admitting: Primary Care

## 2016-11-19 DIAGNOSIS — K589 Irritable bowel syndrome without diarrhea: Secondary | ICD-10-CM

## 2016-11-19 NOTE — Telephone Encounter (Signed)
Does he need a refill of this medication? When does he see GI? I recommend he speak with GI regarding his IBS symptoms.

## 2016-11-19 NOTE — Telephone Encounter (Signed)
Ok to refill? Electronically refill request for dicyclomine (BENTYL) 20 MG tablet Take 1 tablet (20 mg total) by mouth 4 (four) times daily  before meals and at bedtime.  Last prescribed and seen on 10/30/2016

## 2016-11-22 ENCOUNTER — Ambulatory Visit: Payer: Medicare Other | Admitting: Certified Registered Nurse Anesthetist

## 2016-11-22 ENCOUNTER — Encounter: Payer: Self-pay | Admitting: Anesthesiology

## 2016-11-22 ENCOUNTER — Ambulatory Visit
Admission: RE | Admit: 2016-11-22 | Discharge: 2016-11-22 | Disposition: A | Discharge status: Home / Self Care | Payer: Medicare Other | Source: Ambulatory Visit | Attending: Gastroenterology | Admitting: Gastroenterology

## 2016-11-22 ENCOUNTER — Encounter
Admission: RE | Disposition: A | Discharge status: Home / Self Care | Payer: Self-pay | Source: Ambulatory Visit | Attending: Gastroenterology

## 2016-11-22 ENCOUNTER — Encounter: Payer: Self-pay | Admitting: Primary Care

## 2016-11-22 DIAGNOSIS — I10 Essential (primary) hypertension: Secondary | ICD-10-CM | POA: Diagnosis not present

## 2016-11-22 DIAGNOSIS — K5730 Diverticulosis of large intestine without perforation or abscess without bleeding: Secondary | ICD-10-CM

## 2016-11-22 DIAGNOSIS — Q613 Polycystic kidney, unspecified: Secondary | ICD-10-CM | POA: Diagnosis not present

## 2016-11-22 DIAGNOSIS — Z79899 Other long term (current) drug therapy: Secondary | ICD-10-CM | POA: Insufficient documentation

## 2016-11-22 DIAGNOSIS — Z86010 Personal history of colonic polyps: Secondary | ICD-10-CM

## 2016-11-22 DIAGNOSIS — F329 Major depressive disorder, single episode, unspecified: Secondary | ICD-10-CM | POA: Insufficient documentation

## 2016-11-22 DIAGNOSIS — M1990 Unspecified osteoarthritis, unspecified site: Secondary | ICD-10-CM | POA: Diagnosis not present

## 2016-11-22 DIAGNOSIS — K589 Irritable bowel syndrome without diarrhea: Secondary | ICD-10-CM

## 2016-11-22 DIAGNOSIS — N289 Disorder of kidney and ureter, unspecified: Secondary | ICD-10-CM | POA: Insufficient documentation

## 2016-11-22 DIAGNOSIS — Z8371 Family history of colonic polyps: Secondary | ICD-10-CM | POA: Insufficient documentation

## 2016-11-22 DIAGNOSIS — Z1211 Encounter for screening for malignant neoplasm of colon: Secondary | ICD-10-CM | POA: Insufficient documentation

## 2016-11-22 DIAGNOSIS — K640 First degree hemorrhoids: Secondary | ICD-10-CM | POA: Diagnosis not present

## 2016-11-22 DIAGNOSIS — F17200 Nicotine dependence, unspecified, uncomplicated: Secondary | ICD-10-CM | POA: Insufficient documentation

## 2016-11-22 HISTORY — PX: COLONOSCOPY WITH PROPOFOL: SHX5780

## 2016-11-22 SURGERY — COLONOSCOPY WITH PROPOFOL
Anesthesia: General

## 2016-11-22 MED ORDER — PROPOFOL 500 MG/50ML IV EMUL
INTRAVENOUS | Status: AC
  Filled 2016-11-22: qty 50

## 2016-11-22 MED ORDER — SODIUM CHLORIDE 0.9 % IV SOLN
INTRAVENOUS | Status: DC
  Administered 2016-11-22: 1000 mL via INTRAVENOUS

## 2016-11-22 MED ORDER — PROPOFOL 500 MG/50ML IV EMUL
INTRAVENOUS | Status: DC | PRN
  Administered 2016-11-22: 150 ug/kg/min via INTRAVENOUS

## 2016-11-22 MED ORDER — EPHEDRINE SULFATE 50 MG/ML IJ SOLN
INTRAMUSCULAR | Status: DC | PRN
  Administered 2016-11-22: 15 mg via INTRAVENOUS

## 2016-11-22 MED ORDER — PROPOFOL 10 MG/ML IV BOLUS
INTRAVENOUS | Status: AC
Start: 2016-11-22 — End: 2016-11-22
  Filled 2016-11-22: qty 20

## 2016-11-22 MED ORDER — EPHEDRINE SULFATE 50 MG/ML IJ SOLN
INTRAMUSCULAR | Status: AC
  Filled 2016-11-22: qty 1

## 2016-11-22 MED ORDER — PROPOFOL 10 MG/ML IV BOLUS
INTRAVENOUS | Status: DC | PRN
  Administered 2016-11-22: 60 mg via INTRAVENOUS
  Administered 2016-11-22: 30 mg via INTRAVENOUS

## 2016-11-22 NOTE — Telephone Encounter (Signed)
Message left for patient to return my call.  

## 2016-11-22 NOTE — Op Note (Signed)
[facility] Gastroenterology Patient Name: Bobby Little Procedure Date: 11/22/2016 10:06 AM MRN: [id number] Account #: [id number] Date of Birth: [birth year] Admit Type: Outpatient Age: 71 Room: [facility] ENDO ROOM 4 Gender: Male Note Status: Finalized Procedure:            Colonoscopy Indications:          High risk colon cancer surveillance: Personal history                        of colonic polyps Providers:            [name] MD, MD Referring MD:         Pleas Koch (Referring MD) Medicines:            Monitored Anesthesia Care Complications:        No immediate complications. Procedure:            Pre-Anesthesia Assessment:                       - Prior to the procedure, a History and Physical was                        performed, and patient medications, allergies and                        sensitivities were reviewed. The patient's tolerance of                        previous anesthesia was reviewed.                       - The risks and benefits of the procedure and the                        sedation options and risks were discussed with the                        patient. All questions were answered and informed                        consent was obtained.                       - ASA Grade Assessment: II - A patient with mild                        systemic disease.                       After obtaining informed consent, the colonoscope was                        passed under direct vision. Throughout the procedure,                        the patient's blood pressure, pulse, and oxygen                        saturations were monitored continuously. The                        Colonoscope  was introduced through the anus and                        advanced to the the cecum, identified by the                        appendiceal orifice, IC valve and transillumination.                        The colonoscopy was performed with ease. The patient              tolerated the procedure well. The quality of the bowel                        preparation was good. Findings:      The perianal and digital rectal examinations were normal.      Multiple small-mouthed diverticula were found in the sigmoid colon.      Internal hemorrhoids were found during retroflexion. The hemorrhoids       were small and Grade I (internal hemorrhoids that do not prolapse).      No additional abnormalities were found on retroflexion.      The exam was otherwise without abnormality on direct and retroflexion       views. Impression:           - Diverticulosis in the sigmoid colon.                       - Internal hemorrhoids.                       - The examination was otherwise normal on direct and                        retroflexion views.                       - No specimens collected. Recommendation:       - Discharge patient to home (with escort).                       - Resume previous diet.                       - Continue present medications.                       - Repeat colonoscopy in 5 years for surveillance. Procedure Code(s):    --- Professional ---                       X5056, Colorectal cancer screening; colonoscopy on                        individual at high risk Diagnosis Code(s):    --- Professional ---                       Z86.010, Personal history of colonic polyps                       K64.0, First degree hemorrhoids  K57.30, Diverticulosis of large intestine without                        perforation or abscess without bleeding CPT copyright 2016 American Medical Association. All rights reserved. The codes documented in this report are preliminary and upon coder review may  be revised to meet current compliance requirements. [name], MD [name] MD, MD 11/22/2016 10:29:36 AM This report has been signed electronically. Number of Addenda: 0 Note Initiated On: 11/22/2016 10:06 AM Scope Withdrawal Time: 0 hours  10 minutes 29 seconds  Total Procedure Duration: 0 hours 13 minutes 19 seconds       [facility]

## 2016-11-22 NOTE — Transfer of Care (Signed)
Immediate Anesthesia Transfer of Care Note  Patient: Bobby Little  Procedure(s) Performed: Procedure(s): COLONOSCOPY WITH PROPOFOL (N/A)  Patient Location: PACU  Anesthesia Type:General  Level of Consciousness: drowsy and patient cooperative  Airway & Oxygen Therapy: Patient Spontanous Breathing and Patient connected to nasal cannula oxygen  Post-op Assessment: Report given to RN and Post -op Vital signs reviewed and stable  Post vital signs: Reviewed and stable  Last Vitals:  Vitals:   11/22/16 0908  BP: (!) 149/82  Pulse: 63  Resp: 18  Temp: 36.2 C    Last Pain:  Vitals:   11/22/16 0908  TempSrc: Tympanic         Complications: No apparent anesthesia complications

## 2016-11-22 NOTE — Anesthesia Post-op Follow-up Note (Cosign Needed)
Anesthesia QCDR form completed.        

## 2016-11-22 NOTE — Anesthesia Preprocedure Evaluation (Signed)
Anesthesia Evaluation  Patient identified by MRN, date of birth, ID band Patient awake    Reviewed: Allergy & Precautions, H&P , NPO status , Patient's Chart, lab work & pertinent test results, reviewed documented beta blocker date and time   History of Anesthesia Complications Negative for: history of anesthetic complications  Airway Mallampati: II  TM Distance: >3 FB Neck ROM: full    Dental  (+) Caps, Partial Lower, Dental Advidsory Given, Teeth Intact   Pulmonary neg shortness of breath, neg sleep apnea, neg COPD, neg recent URI, Current Smoker,           Cardiovascular Exercise Tolerance: Good hypertension, (-) angina(-) CAD, (-) Past MI and (-) Cardiac Stents (-) dysrhythmias (-) Valvular Problems/Murmurs     Neuro/Psych PSYCHIATRIC DISORDERS (Depression) negative neurological ROS     GI/Hepatic negative GI ROS, Neg liver ROS,   Endo/Other  negative endocrine ROS  Renal/GU Renal disease  negative genitourinary   Musculoskeletal   Abdominal   Peds  Hematology negative hematology ROS (+)   Anesthesia Other Findings Past Medical History: No date: Arthritis No date: Depression No date: Diverticulitis No date: Family history of polyps in the colon No date: Hay fever/allergies No date: History of spinal fusion for scoliosis No date: Hypertension No date: Polycystic kidney   Reproductive/Obstetrics negative OB ROS                             Anesthesia Physical Anesthesia Plan  ASA: II  Anesthesia Plan: General   Post-op Pain Management:    Induction:   Airway Management Planned:   Additional Equipment:   Intra-op Plan:   Post-operative Plan:   Informed Consent: I have reviewed the patients History and Physical, chart, labs and discussed the procedure including the risks, benefits and alternatives for the proposed anesthesia with the patient or authorized representative  who has indicated his/her understanding and acceptance.   Dental Advisory Given  Plan Discussed with: Anesthesiologist, CRNA and Surgeon  Anesthesia Plan Comments:         Anesthesia Quick Evaluation

## 2016-11-22 NOTE — H&P (Signed)
[name] MD [address] [place], [place] [address] Phone: [phone] Fax : [phone]  Primary Care Physician:  Sheral Flow, NP Primary Gastroenterologist:  Dr. [name]   Pre-Procedure History & Physical: HPI:  Bobby Little is a 71 y.o. male is here for an colonoscopy.   Past Medical History:  Diagnosis Date  . Arthritis   . Depression   . Diverticulitis   . Family history of polyps in the colon   . Hay fever/allergies   . History of spinal fusion for scoliosis   . Hypertension   . Polycystic kidney     Past Surgical History:  Procedure Laterality Date  . KNEE ARTHROCENTESIS Right   . TUMOR REMOVAL     stomach, slpeen, and pancreas    Prior to Admission medications   Medication Sig Start Date End Date Taking? Authorizing Provider  acetaminophen (TYLENOL) 500 MG tablet Take 500 mg by mouth.   Yes Historical Provider, MD  busPIRone (BUSPAR) 7.5 MG tablet Take 1 tablet by mouth twice daily for anxiety. 11/06/16  Yes Pleas Koch, NP  cetirizine (ZYRTEC) 10 MG tablet Take 10 mg by mouth daily.   Yes Historical Provider, MD  diclofenac (VOLTAREN) 75 MG EC tablet Take 1 tablet (75 mg total) by mouth 2 (two) times daily. 10/10/16  Yes Pleas Koch, NP  dicyclomine (BENTYL) 20 MG tablet Take 1 tablet (20 mg total) by mouth 4 (four) times daily -  before meals and at bedtime. 10/30/16  Yes Pleas Koch, NP  traZODone (DESYREL) 50 MG tablet Take 1-2 tablets (50-100 mg total) by mouth at bedtime as needed for sleep. 10/30/16  Yes Pleas Koch, NP  valsartan (DIOVAN) 80 MG tablet TAKE 1 TABLET BY MOUTH EVERY DAY 11/19/16  Yes Pleas Koch, NP  metoprolol succinate (TOPROL-XL) 50 MG 24 hr tablet Take 50 mg by mouth.    Historical Provider, MD    Allergies as of 11/06/2016 - Review Complete 10/30/2016  Allergen Reaction Noted  . Beta adrenergic blockers  10/10/2016  . Codeine Nausea And Vomiting     Family History  Problem  Relation Age of Onset  . Hypertension Mother   . Lymphoma Mother   . Hypertension Father   . Valvular heart disease Father   . Lymphoma Maternal Grandmother   . Stroke Maternal Grandfather   . Prostate cancer Paternal Grandfather     Social History   Social History  . Marital status: Married    Spouse name: N/A  . Number of children: N/A  . Years of education: N/A   Occupational History  . Not on file.   Social History Main Topics  . Smoking status: Current Every Day Smoker    Types: Cigars  . Smokeless tobacco: Never Used  . Alcohol use No  . Drug use: Unknown  . Sexual activity: Not on file   Other Topics Concern  . Not on file   Social History Narrative  . No narrative on file    Review of Systems: See HPI, otherwise negative ROS  Physical Exam: BP (!) 149/82   Pulse 63   Temp 97.2 F (36.2 C) (Tympanic)   Resp 18   Ht 6' (1.829 m)   Wt 200 lb (90.7 kg)   SpO2 100%   BMI 27.12 kg/m  General:   Alert,  pleasant and cooperative in NAD Head:  Normocephalic and atraumatic. Neck:  Supple; no masses or thyromegaly. Lungs:  Clear throughout to  auscultation.    Heart:  Regular rate and rhythm. Abdomen:  Soft, nontender and nondistended. Normal bowel sounds, without guarding, and without rebound.   Neurologic:  Alert and  oriented x4;  grossly normal neurologically.  Impression/Plan: Bobby Little is here for an colonoscopy to be performed for surveillance due to prior history of colon polyps   Risks, benefits, limitations, and alternatives regarding  colonoscopy have been reviewed with the patient.  Questions have been answered.  All parties agreeable.   [name], MD  11/22/2016, 9:27 AM

## 2016-11-23 ENCOUNTER — Encounter: Payer: Self-pay | Admitting: Gastroenterology

## 2016-11-23 NOTE — Telephone Encounter (Signed)
Spoken to patient and he stated that he does not need a refill. Patient stated that he is not a GI doctor but if he gets worse he will let Anda Kraft know. Patient stated that he has IBS for 20 years now and this has been helping when it gets bad.

## 2016-11-23 NOTE — Anesthesia Postprocedure Evaluation (Signed)
Anesthesia Post Note  Patient: Nicholes Hibler  Procedure(s) Performed: Procedure(s) (LRB): COLONOSCOPY WITH PROPOFOL (N/A)  Patient location during evaluation: Endoscopy Anesthesia Type: General Level of consciousness: awake and alert Pain management: pain level controlled Vital Signs Assessment: post-procedure vital signs reviewed and stable Respiratory status: spontaneous breathing, nonlabored ventilation, respiratory function stable and patient connected to nasal cannula oxygen Cardiovascular status: blood pressure returned to baseline and stable Postop Assessment: no signs of nausea or vomiting Anesthetic complications: no     Last Vitals:  Vitals:   11/22/16 1051 11/22/16 1101  BP: 125/87 130/79  Pulse: 66 62  Resp: (!) 21 14  Temp:      Last Pain:  Vitals:   11/23/16 0741  TempSrc:   PainSc: 0-No pain                 Martha Clan

## 2016-11-25 ENCOUNTER — Encounter: Payer: Self-pay | Admitting: Primary Care

## 2016-11-25 DIAGNOSIS — R1900 Intra-abdominal and pelvic swelling, mass and lump, unspecified site: Secondary | ICD-10-CM

## 2016-11-26 ENCOUNTER — Encounter: Payer: Self-pay | Admitting: Primary Care

## 2016-11-26 ENCOUNTER — Other Ambulatory Visit: Payer: Self-pay | Admitting: Primary Care

## 2016-11-26 DIAGNOSIS — G4700 Insomnia, unspecified: Secondary | ICD-10-CM

## 2016-11-26 MED ORDER — CLONAZEPAM 1 MG PO TABS: ORAL_TABLET | ORAL | 0 refills | Status: DC

## 2016-11-26 NOTE — Telephone Encounter (Signed)
Called in medication to the pharmacy as instructed. 

## 2016-11-26 NOTE — Telephone Encounter (Signed)
Please call in clonazepam 1 mg tablets. Take one half to one tablet by mouth once daily as needed for anxiety. #30, no refills

## 2016-12-05 ENCOUNTER — Ambulatory Visit
Admission: RE | Admit: 2016-12-05 | Discharge: 2016-12-05 | Disposition: A | Discharge status: Home / Self Care | Payer: Medicare Other | Source: Ambulatory Visit | Attending: Primary Care | Admitting: Primary Care

## 2016-12-05 DIAGNOSIS — I517 Cardiomegaly: Secondary | ICD-10-CM | POA: Diagnosis not present

## 2016-12-05 DIAGNOSIS — N281 Cyst of kidney, acquired: Secondary | ICD-10-CM | POA: Insufficient documentation

## 2016-12-05 DIAGNOSIS — R1900 Intra-abdominal and pelvic swelling, mass and lump, unspecified site: Secondary | ICD-10-CM | POA: Diagnosis present

## 2016-12-05 DIAGNOSIS — I700 Atherosclerosis of aorta: Secondary | ICD-10-CM | POA: Diagnosis not present

## 2016-12-05 LAB — POCT I-STAT CREATININE: CREATININE: 1.1 mg/dL (ref 0.61–1.24)

## 2016-12-05 MED ORDER — IOPAMIDOL (ISOVUE-300) INJECTION 61%
100.0000 mL | Freq: Once | INTRAVENOUS | Status: AC | PRN
  Administered 2016-12-05: 100 mL via INTRAVENOUS

## 2016-12-06 ENCOUNTER — Encounter: Payer: Self-pay | Admitting: Primary Care

## 2016-12-06 ENCOUNTER — Telehealth: Payer: Self-pay | Admitting: *Deleted

## 2016-12-06 DIAGNOSIS — I10 Essential (primary) hypertension: Secondary | ICD-10-CM

## 2016-12-06 MED ORDER — VALSARTAN 80 MG PO TABS: 120.0000 mg | ORAL_TABLET | Freq: Every day | ORAL | Status: DC

## 2016-12-06 NOTE — Telephone Encounter (Signed)
Patient left a voicemail stating that Anda Kraft put him on Valsartan 80 mg about two weeks ago. Patient stated that he was feeling bad and took his BP and it was 147/107. Patient wants to know if he should take more medication or add something else since the blood pressure reading?

## 2016-12-06 NOTE — Telephone Encounter (Signed)
Would inc valsartan to 1.5 tabs a day, update [place] re: BP in about 1 week.  Thanks.  Med list adjusted.   Should be able to tolerate as Cr was okay recently.

## 2016-12-06 NOTE — Telephone Encounter (Signed)
Patient advised.  Patient says he has tried to wean off Klonopin in the past and it has made his BP elevate and that is what is happening now.  Patient advised that increasing the Valsartan to 1.5 tabs is Dr. Josefine Class advice.

## 2016-12-06 NOTE — Telephone Encounter (Signed)
Noted, will check on patient's BP in one week.

## 2016-12-07 ENCOUNTER — Encounter: Payer: Self-pay | Admitting: Primary Care

## 2016-12-07 ENCOUNTER — Other Ambulatory Visit: Payer: Self-pay | Admitting: Primary Care

## 2016-12-11 ENCOUNTER — Encounter: Payer: Self-pay | Admitting: Primary Care

## 2016-12-12 ENCOUNTER — Encounter: Payer: Self-pay | Admitting: Primary Care

## 2016-12-12 ENCOUNTER — Ambulatory Visit: Payer: Medicare Other | Admitting: Primary Care

## 2016-12-12 ENCOUNTER — Ambulatory Visit (INDEPENDENT_AMBULATORY_CARE_PROVIDER_SITE_OTHER): Payer: Medicare Other | Admitting: Primary Care

## 2016-12-12 DIAGNOSIS — I10 Essential (primary) hypertension: Secondary | ICD-10-CM

## 2016-12-12 DIAGNOSIS — F411 Generalized anxiety disorder: Secondary | ICD-10-CM

## 2016-12-12 MED ORDER — VALSARTAN 80 MG PO TABS: 80.0000 mg | ORAL_TABLET | Freq: Two times a day (BID) | ORAL | 1 refills | Status: DC

## 2016-12-12 NOTE — Assessment & Plan Note (Signed)
Experiencing increased anxiety and/or rebound anxiety from reduction and clonazepam dose. He has been on the reduced dose for 2 weeks now, we will continue the reduced dose of 0.5 mg twice daily with a goal of eventually weaning off.  Will treat blood pressure, continue to monitor as anxiety reduces. Will continue clonazepam at current dose for the next 4 weeks, consider reducing to 0.25 mg in the morning and 0.5 mg at night.  Discussed that I will not prescribe clonazepam solely, we'll not prescribe clonazepam if it is taken scheduled. Offered psychiatry referral for further evaluation, he declines. He has failed treatment on numerous other medications the past including recently with BuSpar.

## 2016-12-12 NOTE — Progress Notes (Signed)
Subjective:    Patient ID: Bobby Little, male    DOB: 1946-03-19, 71 y.o.   MRN: [id number]  HPI  Bobby Little is a 70 year old male who presents today for follow up of hypertension. He called into our phone line on 12/06/16 with reports of hypertension with BP reading 147/107. He is currently managed on Valsartan 80 mg which was increased to 120 mg per MD on call.   His BP in the office today is 172/104. He has been weaning down off of his Klonopin for which he's taken for years. He's currently taking valsartan 80 mg now. He did take 1.5-2 tablets and BP reduced down to 80 mg after BP readings of 115/60's. He denies chest pain, shortness of breath, dizziness.  Since weaning down off On Clonazepam to 0.5 mg twice daily his anxiety and blood pressure have increased. He's currently taking 0.5 mg of Clonazepam in the morning and 0.5 mg in the evening. Recently he's had a lot of personal and financial stress which has caused an increase in anxiety. He stopped taking the Buspar as it caused him to feel "fuzzy headed". He's not had to take the Trazodone as he's sleeping better since taking Clonazepam in the evening. GAD 7 score of 6 today.   Review of Systems  Constitutional: Negative for fatigue.  Eyes: Negative for visual disturbance.  Respiratory: Negative for shortness of breath.   Cardiovascular: Negative for chest pain.  Neurological: Negative for dizziness and headaches.  Psychiatric/Behavioral: The patient is nervous/anxious.        Past Medical History:  Diagnosis Date  . Arthritis   . Depression   . Diverticulitis   . Family history of polyps in the colon   . Hay fever/allergies   . History of spinal fusion for scoliosis   . Hypertension   . Polycystic kidney      Social History   Social History  . Marital status: Married    Spouse name: N/A  . Number of children: N/A  . Years of education: N/A   Occupational History  . Not on file.   Social History Main Topics  .  Smoking status: Current Every Day Smoker    Types: Cigars  . Smokeless tobacco: Never Used  . Alcohol use No  . Drug use: Unknown  . Sexual activity: Not on file   Other Topics Concern  . Not on file   Social History Narrative  . No narrative on file    Past Surgical History:  Procedure Laterality Date  . COLONOSCOPY WITH PROPOFOL N/A 11/22/2016   Procedure: COLONOSCOPY WITH PROPOFOL;  Surgeon: [name], MD;  Location: [facility] ENDOSCOPY;  Service: Endoscopy;  Laterality: N/A;  . KNEE ARTHROCENTESIS Right   . TUMOR REMOVAL     stomach, slpeen, and pancreas    Family History  Problem Relation Age of Onset  . Hypertension Mother   . Lymphoma Mother   . Hypertension Father   . Valvular heart disease Father   . Lymphoma Maternal Grandmother   . Stroke Maternal Grandfather   . Prostate cancer Paternal Grandfather     Allergies  Allergen Reactions  . Beta Adrenergic Blockers     Dizziness, low heart rate  . Codeine Nausea And Vomiting    Current Outpatient Prescriptions on File Prior to Visit  Medication Sig Dispense Refill  . acetaminophen (TYLENOL) 500 MG tablet Take 500 mg by mouth.    . busPIRone (BUSPAR) 7.5 MG tablet Take 1 tablet  by mouth twice daily for anxiety. 60 tablet 0  . busPIRone (BUSPAR) 7.5 MG tablet TAKE 1 TABLET BY MOUTH TWICE A DAY AS NEEDED ANXIETY 60 tablet 0  . cetirizine (ZYRTEC) 10 MG tablet Take 10 mg by mouth daily.    . clonazePAM (KLONOPIN) 1 MG tablet Take one half to one tablet by mouth once daily as needed for anxiety. (Patient taking differently: Take one tablet by mouth once daily as needed for anxiety.) 30 tablet 0  . diclofenac (VOLTAREN) 75 MG EC tablet Take 1 tablet (75 mg total) by mouth 2 (two) times daily. 60 tablet 1  . dicyclomine (BENTYL) 20 MG tablet Take 1 tablet (20 mg total) by mouth 4 (four) times daily -  before meals and at bedtime. 90 tablet 0  . traZODone (DESYREL) 50 MG tablet Take 1-2 tablets (50-100 mg total) by mouth at  bedtime as needed for sleep. 60 tablet 0   No current facility-administered medications on file prior to visit.     BP (!) 172/104   Pulse 68   Temp 98 F (36.7 C) (Oral)   Ht 6\' 1"  (1.854 m)   Wt 204 lb 6.4 oz (92.7 kg)   SpO2 97%   BMI 26.97 kg/m    Objective:   Physical Exam  Constitutional: He appears well-nourished.  Neck: Neck supple.  Cardiovascular: Normal rate and regular rhythm.   Pulmonary/Chest: Effort normal and breath sounds normal.  Skin: Skin is warm and dry.  Psychiatric: He has a normal mood and affect.          Assessment & Plan:

## 2016-12-12 NOTE — Telephone Encounter (Signed)
Bobby Little, is there anyway we can get the CT read now? I'll make a call or place another order if I have to. If you think this will be back sometime this week then okay to wait, but I want the results by Friday.

## 2016-12-12 NOTE — Patient Instructions (Signed)
Continue Clonazepam 0.5 mg in the morning and 0.5 mg in the evening as needed for anxiety. Take this only as needed for anxiety. Our goal is to wean you off.  We've increased your valsartan to 80 mg twice daily.  Continue to monitor your blood pressure at home and I will message you for readings in 2-3 weeks.  It was a pleasure to see you today!

## 2016-12-12 NOTE — Assessment & Plan Note (Signed)
Above goal in the office today, will increase valsartan to 80 mg twice daily. He will monitor his blood pressure for the next couple weeks, will call for readings at that time. He will call sooner if no decrease in blood pressure.  Suspect elevations in blood pressure related to anxiety and perhaps rebound anxiety from reduction in clonazepam dose as he's taken this for years.

## 2016-12-13 ENCOUNTER — Encounter: Payer: Self-pay | Admitting: Primary Care

## 2016-12-13 ENCOUNTER — Other Ambulatory Visit: Payer: Self-pay | Admitting: Primary Care

## 2016-12-13 DIAGNOSIS — I10 Essential (primary) hypertension: Secondary | ICD-10-CM

## 2016-12-13 MED ORDER — AZILSARTAN MEDOXOMIL 40 MG PO TABS: 1.0000 | ORAL_TABLET | Freq: Every day | ORAL | 0 refills | Status: DC

## 2016-12-31 ENCOUNTER — Other Ambulatory Visit: Payer: Self-pay | Admitting: Primary Care

## 2016-12-31 ENCOUNTER — Encounter: Payer: Self-pay | Admitting: Primary Care

## 2016-12-31 DIAGNOSIS — F411 Generalized anxiety disorder: Secondary | ICD-10-CM

## 2016-12-31 MED ORDER — CLONAZEPAM 0.5 MG PO TABS: ORAL_TABLET | ORAL | 0 refills | Status: DC

## 2016-12-31 NOTE — Telephone Encounter (Signed)
Called in medication to the pharmacy as instructed. 

## 2016-12-31 NOTE — Telephone Encounter (Signed)
Please phone in Clonazepam 0.5 mg tablets. Take 1 tablet by mouth once daily as needed for anxiety. #30, no refills.

## 2016-12-31 NOTE — Telephone Encounter (Signed)
Ok to refill? Electronically refill request for clonazePAM (KLONOPIN) 1 MG tablet. Last prescribed on 11/26/2016. Last seen on 12/12/2016.

## 2017-01-01 ENCOUNTER — Other Ambulatory Visit: Payer: Self-pay | Admitting: Primary Care

## 2017-01-01 ENCOUNTER — Ambulatory Visit
Admission: RE | Admit: 2017-01-01 | Discharge: 2017-01-01 | Disposition: A | Discharge status: Home / Self Care | Payer: Self-pay | Source: Ambulatory Visit | Attending: Primary Care | Admitting: Primary Care

## 2017-01-01 DIAGNOSIS — R52 Pain, unspecified: Secondary | ICD-10-CM

## 2017-01-03 ENCOUNTER — Encounter: Payer: Self-pay | Admitting: Primary Care

## 2017-01-03 DIAGNOSIS — Q613 Polycystic kidney, unspecified: Secondary | ICD-10-CM

## 2017-01-10 ENCOUNTER — Encounter: Payer: Self-pay | Admitting: Primary Care

## 2017-01-10 DIAGNOSIS — I10 Essential (primary) hypertension: Secondary | ICD-10-CM

## 2017-01-11 MED ORDER — AZILSARTAN MEDOXOMIL 40 MG PO TABS: 1.0000 | ORAL_TABLET | Freq: Every day | ORAL | 1 refills | Status: DC

## 2017-01-25 ENCOUNTER — Encounter: Payer: Self-pay | Admitting: Primary Care

## 2017-01-26 ENCOUNTER — Encounter: Payer: Self-pay | Admitting: Primary Care

## 2017-01-26 ENCOUNTER — Ambulatory Visit: Payer: Medicare Other | Admitting: Family Medicine

## 2017-01-28 ENCOUNTER — Telehealth: Payer: Self-pay

## 2017-01-28 ENCOUNTER — Ambulatory Visit: Payer: Medicare Other | Admitting: Primary Care

## 2017-01-28 NOTE — Telephone Encounter (Signed)
PLEASE NOTE: All timestamps contained within this report are represented as [place] Standard Time. CONFIDENTIALTY NOTICE: This fax transmission is intended only for the addressee. It contains information that is legally privileged, confidential or otherwise protected from use or disclosure. If you are not the intended recipient, you are strictly prohibited from reviewing, disclosing, copying using or disseminating any of this information or taking any action in reliance on or regarding this information. If you have received this fax in error, please notify us immediately by telephone so that we can arrange for its return to [place]. Phone: [phone], Toll-Free: [phone], Fax: [phone] Page: 1 of 3 Call Id: [id number] [place] Patient Name: Bobby Little Gender: Male DOB: 1946-03-18 Age: 71 Y 11 M 11 D Return Phone Number: [phone] (Primary) City/State/Zip: [place] Client [place] Night - Client Client Site [place] Physician Alma Friendly - NP Who Is Calling Patient / Member / Family / Caregiver Call Type Triage / Clinical Relationship To Patient Self Return Phone Number [phone] (Primary) Chief Complaint Headache Reason for Call Symptomatic / Request for Dale is having sinus pressure and a headache. Nurse Assessment Nurse: Martyn Ehrich, RN, Felicia Date/Time (Eastern Time): 01/25/2017 5:59:09 PM Confirm and document reason for call. If symptomatic, describe symptoms. ---PT is having sinus pain and pressure, HA (now it is level 5) for 3-4 d and not improving. Slight fever earlier. Face is swelled. Now after Tylenol temp is NL Does the PT have any chronic conditions? (i.e. diabetes, asthma, etc.) ---No Guidelines Guideline Title Affirmed Question Sinus Pain or Congestion [1] Redness or  swelling on the cheek, forehead or around the eye AND [2] no fever Disp. Time Eilene Ghazi Time) Disposition Final User 01/25/2017 6:22:51 PM See Physician within 4 Hours (or PCP triage) Yes Martyn Ehrich, RN, Felicia Referrals GO TO FACILITY REFUSED Care Advice Given Per Guideline SEE PHYSICIAN WITHIN 4 HOURS (or PCP triage): After Care Instructions Given Call Event Type User Date / Time Description Education document email Berta Minor 12/27/5991 5:70:17 PM [place] [facility] Instructions Comments User: Daphene Calamity, RN Date/Time Eilene Ghazi Time): 01/25/2017 6:17:56 PM he went to [facility] this am but he left before being seen User: Daphene Calamity, RN Date/Time (Eastern Time): 01/25/2017 6:19:31 PM PLEASE NOTE: All timestamps contained within this report are represented as [place] Standard Time. CONFIDENTIALTY NOTICE: This fax transmission is intended only for the addressee. It contains information that is legally privileged, confidential or otherwise protected from use or disclosure. If you are not the intended recipient, you are strictly prohibited from reviewing, disclosing, copying using or disseminating any of this information or taking any action in reliance on or regarding this information. If you have received this fax in error, please notify us immediately by telephone so that we can arrange for its return to [place]. 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 They dont have any Rx. 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 Then office offered appt and he said he did e visit and office said that should take care of it then without a visit. User: Daphene Calamity, RN Date/Time Eilene Ghazi Time): 01/25/2017 6:52:54 PM suggested he use simply saline use 4 x a day and gently blow - use warm shower with door closed and walk in there for a little while - humidifier next to bed. Drink plenty of fluids if not on fluid restriction. Suggested he only use guafenesin as only ingredient - bc it thins the mucous - but dont use dextromethorphan bc it suppresses the cough User: Daphene Calamity, RN Date/Time Eilene Ghazi Time): 01/25/2017 6:54:58 PM referenced cough triage guide User: Daphene Calamity, RN Date/Time Eilene Ghazi Time): 01/25/2017 6:55:33 PM Alma Friendly, NP - PLEASE BE AWARE PT PAID FOR TELEHEALTH VISIT [facility] DID NOT RECEIVE A RESPONSE OR TREATMENT Paging DoctorName Phone DateTime Action Result/Outcome Notes Renford Dills - MD [phone] 01/25/2017 6:34:58 PM Doctor Paged Called On Call Provider - Reached Renford Dills - MD 01/25/2017 6:35:44 PM Message Result Spoke with On Call - General he said ask caller what the telehealth said they would do. If you need to you can call back - MD said he doesnt want to do something against what telehealth told him Renford Dills - MD [phone] 01/25/2017 6:44:26 PM Doctor Paged Called On Call Provider - Sherryl Manges - MD 01/25/2017 6:54:43 PM Message Result Spoke with On Call - General send a message that he signed up for telehealth and it fell through and was not evaluated - if any appts at [facility] he can sign up for that 1st  thing in the am - if no fever and not sick other wise no wheezing and not Sob and no chest pain give him - cough and cold general measures Apoloigize that he didnt get cared for with telehealth. HE is not sure if the telehealth has a few hours to respond within a certain timeframe - called pt and he denies SOB and denies CHest pain denies wheezing - gave caller MD advice and told him call back if he has fever - gave him phone no. for [facility] and told him call at 9 am for an appt. That Dr. Damita Dunnings will be glad to see him tom - call for appt at [place] NOTE: All timestamps contained within this report are represented as [place] Standard Time. CONFIDENTIALTY NOTICE: This fax transmission is intended only for the addressee. It contains information that is legally privileged, confidential or otherwise protected from use or disclosure. If you are not the intended recipient, you are strictly prohibited from reviewing, disclosing, copying using or disseminating any of this information or taking any action in reliance on or regarding this information. If you have received this fax in error, please notify us immediately by telephone so that we can arrange for its return to [place]. Phone: [phone], Toll-Free: [phone], Fax: [phone] Page: 3 of 3 Call Id: [id number]

## 2017-01-28 NOTE — Telephone Encounter (Signed)
Pt seen [facility] on 01/26/17; pt has appt with Allie Bossier NP on 01/28/17 at 10:45.

## 2017-01-28 NOTE — Telephone Encounter (Signed)
Bobby Little, see My Chart message from an "e-visit" that didn't go through. Can you contact him? Refund?

## 2017-01-28 NOTE — Telephone Encounter (Signed)
No appointment today. Patient sent my chart message and is upset with the e-visit process. My chart message forwarded to the office manager, Barbera Setters.

## 2017-01-29 ENCOUNTER — Other Ambulatory Visit: Payer: Self-pay | Admitting: Primary Care

## 2017-01-29 ENCOUNTER — Encounter: Payer: Self-pay | Admitting: Primary Care

## 2017-01-29 DIAGNOSIS — F411 Generalized anxiety disorder: Secondary | ICD-10-CM

## 2017-01-29 MED ORDER — CLONAZEPAM 0.5 MG PO TABS: ORAL_TABLET | ORAL | 0 refills | Status: DC

## 2017-01-29 NOTE — Telephone Encounter (Signed)
Please call in a refill of his clonazepam 0.5 mg. Take 1/2 to 1 tablet by mouth once daily as needed for anxiety. Use sparingly. #30, no refills.

## 2017-01-29 NOTE — Telephone Encounter (Signed)
Called in medication to the pharmacy as instructed. 

## 2017-01-29 NOTE — Telephone Encounter (Signed)
Ok to refill? Electronically refill request for clonazePAM (KLONOPIN) 0.5 MG tablet.  Last prescribed on 12/31/2016. Last seen on 12/12/2016

## 2017-02-01 NOTE — Telephone Encounter (Signed)
Opened in error

## 2017-02-01 NOTE — Telephone Encounter (Signed)
Called and spoke to patient, refund has been issued.  Apologized that his e-visit did not proceed as it should have.

## 2017-02-08 ENCOUNTER — Encounter: Payer: Self-pay | Admitting: Primary Care

## 2017-02-18 ENCOUNTER — Telehealth: Payer: Self-pay

## 2017-02-18 NOTE — Telephone Encounter (Signed)
Noted  

## 2017-02-18 NOTE — Telephone Encounter (Signed)
Apparently this was some sort of follow up survey sent to the patient.  Refer to team health note:  Patient Name: Bobby Little Gender: Male DOB: 1946/02/05 Age: 71 Y 5 D Return Phone Number: [phone] (Primary) Translation: Best Time to Call: Return Phone Number: [phone] (Primary) Import Site: Visit Id: Disposition Name: Physicians Name: Is Void: Void Reason: Date of Discharge: Outbound Escalation Type: Outbound Escalation Type 2: User: Katrinka Blazing Date/Time: 02/18/2017 11:12:51 AM Triage Guideline: Satisfaction Survey - PAH "Hello, may I please speak with Mr/Mrs.Ms first and last name?" If not that person: Is there a better time or different number where I could reach ______? If it is that person: My name is [name]. I'm a patient coordinator and I am calling on behalf of [Client name]. You recently called after hours and I would like to ask about your experience with our after hours services and to make sure you were satisfied. Do you have a moment to answer a few brief questions? [Is there a better time to call back?] -----Yes For quality and training purposes this call will be recorded. Is this ok? -----Yes Was the advice given helpful and well explained? -----No Were you satisfied with the outcome of your call? -----No Regardless of the outcome of your call were you satisfied with the treatment you received by the nurse? -----Yes Is there anything we could have done to make our service better? -----Provide better service Disposition: Close Completed CONFIDENTIALTY

## 2017-03-06 ENCOUNTER — Encounter: Payer: Self-pay | Admitting: Primary Care

## 2017-03-06 DIAGNOSIS — F411 Generalized anxiety disorder: Secondary | ICD-10-CM

## 2017-03-07 MED ORDER — CLONAZEPAM 0.5 MG PO TABS: ORAL_TABLET | ORAL | 0 refills | Status: DC

## 2017-03-07 NOTE — Telephone Encounter (Signed)
Please call in clonazepam 0.5 mg. Take 1 tablet by mouth once daily as needed for anxiety. Use sparingly. #30, no refills.

## 2017-03-07 NOTE — Telephone Encounter (Signed)
Called in medication to the pharmacy as instructed. 

## 2017-03-13 ENCOUNTER — Encounter: Payer: Self-pay | Admitting: Primary Care

## 2017-03-17 ENCOUNTER — Encounter: Payer: Self-pay | Admitting: Primary Care

## 2017-03-20 ENCOUNTER — Encounter: Payer: Self-pay | Admitting: Primary Care

## 2017-04-02 ENCOUNTER — Ambulatory Visit (INDEPENDENT_AMBULATORY_CARE_PROVIDER_SITE_OTHER): Payer: Medicare Other | Admitting: Primary Care

## 2017-04-02 ENCOUNTER — Ambulatory Visit (INDEPENDENT_AMBULATORY_CARE_PROVIDER_SITE_OTHER)
Admission: RE | Admit: 2017-04-02 | Discharge: 2017-04-02 | Disposition: A | Discharge status: Home / Self Care | Payer: Medicare Other | Source: Ambulatory Visit | Attending: Primary Care | Admitting: Primary Care

## 2017-04-02 ENCOUNTER — Encounter: Payer: Self-pay | Admitting: Primary Care

## 2017-04-02 ENCOUNTER — Other Ambulatory Visit: Payer: Self-pay | Admitting: Primary Care

## 2017-04-02 VITALS — BP 120/76 | HR 67 | Temp 97.9°F | Wt 207.8 lb

## 2017-04-02 DIAGNOSIS — M25552 Pain in left hip: Secondary | ICD-10-CM | POA: Diagnosis not present

## 2017-04-02 DIAGNOSIS — Z23 Encounter for immunization: Secondary | ICD-10-CM

## 2017-04-02 DIAGNOSIS — G4700 Insomnia, unspecified: Secondary | ICD-10-CM

## 2017-04-02 DIAGNOSIS — Z1159 Encounter for screening for other viral diseases: Secondary | ICD-10-CM | POA: Diagnosis not present
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
## 2017-04-02 NOTE — Patient Instructions (Signed)
Complete xray(s) and labs prior to leaving today. I will notify you of your results once received.  You can take Naproxen twice daily with food as needed for pain.   Continue to stretch and apply ice as needed.  It was a pleasure to see you today!

## 2017-04-02 NOTE — Addendum Note (Signed)
Addended by: Jacqualin Combes on: 04/02/2017 05:09 PM   Modules accepted: Orders

## 2017-04-03 LAB — HEPATITIS C ANTIBODY
Hepatitis C Ab: NONREACTIVE
SIGNAL TO CUT-OFF: 0.03 (ref <1.00)

## 2017-04-04 ENCOUNTER — Encounter: Payer: Self-pay | Admitting: Primary Care

## 2017-04-04 DIAGNOSIS — G4700 Insomnia, unspecified: Secondary | ICD-10-CM

## 2017-04-04 DIAGNOSIS — F411 Generalized anxiety disorder: Secondary | ICD-10-CM

## 2017-04-08 MED ORDER — MIRTAZAPINE 15 MG PO TABS: 15.0000 mg | ORAL_TABLET | Freq: Every day | ORAL | 0 refills | Status: DC

## 2017-04-10 ENCOUNTER — Encounter: Payer: Self-pay | Admitting: Primary Care

## 2017-04-17 ENCOUNTER — Encounter: Payer: Self-pay | Admitting: Primary Care

## 2017-04-18 NOTE — Telephone Encounter (Signed)
Please call in clonazepam 0.5 mg. Take 1 tablet by mouth once daily as needed for anxiety/panic attacks. #30, no refills.

## 2017-04-19 ENCOUNTER — Encounter: Payer: Self-pay | Admitting: Primary Care

## 2017-04-19 NOTE — Telephone Encounter (Signed)
Per message from patient. There is no needed to call this Rx in due patient did not realized he had a refill at the pharmacy.

## 2017-04-23 ENCOUNTER — Encounter: Payer: Self-pay | Admitting: Primary Care

## 2017-05-02 ENCOUNTER — Encounter: Payer: Self-pay | Admitting: Primary Care

## 2017-05-06 ENCOUNTER — Encounter: Payer: Self-pay | Admitting: Primary Care

## 2017-05-08 ENCOUNTER — Encounter: Payer: Self-pay | Admitting: Primary Care

## 2017-05-15 ENCOUNTER — Other Ambulatory Visit: Payer: Self-pay | Admitting: Primary Care

## 2017-05-15 DIAGNOSIS — F411 Generalized anxiety disorder: Secondary | ICD-10-CM

## 2017-05-15 NOTE — Telephone Encounter (Signed)
Ok to refill? Electronically refill request for clonazePAM (KLONOPIN) 0.5 MG tablet  Last prescribed on 03/07/2017. Last seen on 04/02/2017

## 2017-05-15 NOTE — Telephone Encounter (Signed)
Please call in Clonazepam, #30, no refills. He will be seeing psychiatry in November and will need to get any additional refills from their office as I do not support daily use of this medication for daily anxiety symptoms.

## 2017-05-16 NOTE — Telephone Encounter (Signed)
Called in medication to the pharmacy as instructed. 

## 2017-06-11 ENCOUNTER — Other Ambulatory Visit: Payer: Self-pay | Admitting: Primary Care

## 2017-06-11 DIAGNOSIS — F411 Generalized anxiety disorder: Secondary | ICD-10-CM

## 2017-06-11 NOTE — Telephone Encounter (Signed)
Ok to refill? Electronically refill request for clonazePAM (KLONOPIN) 0.5 MG tablet   Last prescribed by 05/15/2017. Last seen on 04/02/2017

## 2017-06-11 NOTE — Telephone Encounter (Signed)
Patient now following with psychiatry. Will need to get refilled from their office.

## 2017-06-12 ENCOUNTER — Encounter: Payer: Self-pay | Admitting: Primary Care

## 2017-09-03 ENCOUNTER — Encounter: Payer: Self-pay | Admitting: Oncology

## 2017-09-03 ENCOUNTER — Other Ambulatory Visit: Payer: Self-pay

## 2017-09-03 ENCOUNTER — Inpatient Hospital Stay: Payer: Medicare Other

## 2017-09-03 ENCOUNTER — Inpatient Hospital Stay: Payer: Medicare Other | Attending: Oncology | Admitting: Oncology

## 2017-09-03 VITALS — BP 109/73 | HR 88 | Temp 95.9°F | Resp 18 | Ht 73.62 in | Wt 205.2 lb

## 2017-09-03 DIAGNOSIS — D3A8 Other benign neuroendocrine tumors: Secondary | ICD-10-CM

## 2017-09-03 DIAGNOSIS — Z720 Tobacco use: Secondary | ICD-10-CM | POA: Insufficient documentation

## 2017-09-03 DIAGNOSIS — D214 Benign neoplasm of connective and other soft tissue of abdomen: Secondary | ICD-10-CM

## 2017-09-03 DIAGNOSIS — C7A8 Other malignant neuroendocrine tumors: Secondary | ICD-10-CM

## 2017-09-03 LAB — CBC WITH DIFFERENTIAL/PLATELET
BASOS ABS: 0.2 10*3/uL — AB (ref 0–0.1)
BASOS PCT: 2 %
Eosinophils Absolute: 0.3 10*3/uL (ref 0–0.7)
Eosinophils Relative: 2 %
HCT: 47.4 % (ref 40.0–52.0)
HEMOGLOBIN: 15.9 g/dL (ref 13.0–18.0)
LYMPHS PCT: 32 %
Lymphs Abs: 3.6 10*3/uL (ref 1.0–3.6)
MCH: 30.5 pg (ref 26.0–34.0)
MCHC: 33.6 g/dL (ref 32.0–36.0)
MCV: 90.6 fL (ref 80.0–100.0)
MONO ABS: 0.9 10*3/uL (ref 0.2–1.0)
MONOS PCT: 8 %
NEUTROS ABS: 6.1 10*3/uL (ref 1.4–6.5)
NEUTROS PCT: 56 %
Platelets: 257 10*3/uL (ref 150–440)
RBC: 5.23 MIL/uL (ref 4.40–5.90)
RDW: 13.4 % (ref 11.5–14.5)
WBC: 11.2 10*3/uL — ABNORMAL HIGH (ref 3.8–10.6)

## 2017-09-03 NOTE — Progress Notes (Signed)
Release of information obtained to obtain records from [facility]. Oncology Nurse Navigator Documentation  Navigator Location: [facility]Med Onc (09/03/17 1300)   )Navigator Encounter Type: Initial MedOnc (09/03/17 1300)                                                    Time Spent with Patient: 15 (09/03/17 1300)

## 2017-09-03 NOTE — Progress Notes (Signed)
Here for new evaluation

## 2017-09-05 ENCOUNTER — Encounter: Payer: Self-pay | Admitting: Oncology

## 2017-09-05 NOTE — Progress Notes (Addendum)
Hematology/Oncology Consult note [facility] Telephone:([phone] Fax:([phone]) [phone]  Patient Care Team: Kirk Ruths, MD as PCP - General (Internal Medicine)   Name of the patient: Bobby Little  [id number]  09-22-1945    Reason for referral- 1. H/o gastric GIST 2. 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  This was in [place].  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  At the time of doing pancreatic to me patient was also found to have a pedunculated gist tumor of the anterior gastric wall which was resected.  He underwent a partial gastrectomy at the same time of distal pancreatectomy on 11/13/2013.  He has been in remission since.  Last CT abdomen and pelvis with contrast in May 2018 revealed postoperative changes in the pancreatic tail but the remainder of the pancreas was unremarkable.  10 cm benign cyst in the lower right kidney was noted.  No other concerning findings were noted.  Patient reports doing well.  Denies any symptoms of abdominal pain, constipation diarrhea or blood in his stools.  Denies any symptoms of dizziness or heart palpitations.  He does have a bulge in the lateral abdominal wall since his surgery.  Appetite is good and he denies any unintentional weight loss.  He does have chronic low back pain and pain in his knees for which he sees Dr. Ouida Sills   ECOG PS- 0  Pain scale- 0   Review of systems- Review of Systems  Constitutional: Negative for chills, fever, malaise/fatigue and weight loss.  HENT: Negative for congestion, ear discharge and nosebleeds.     Eyes: Negative for blurred vision.  Respiratory: Negative for cough, hemoptysis, sputum production, shortness of breath and wheezing.   Cardiovascular: Negative for chest pain, palpitations, orthopnea and claudication.  Gastrointestinal: Negative for abdominal pain, blood in stool, constipation, diarrhea, heartburn, melena, nausea and vomiting.  Genitourinary: Negative for dysuria, flank pain, frequency, hematuria and urgency.  Musculoskeletal: Positive for back pain and joint pain. Negative for myalgias.  Skin: Negative for rash.  Neurological: Negative for dizziness, tingling, focal weakness, seizures, weakness and headaches.  Endo/Heme/Allergies: Does not bruise/bleed easily.  Psychiatric/Behavioral: Negative for depression and suicidal ideas. The patient does not have insomnia.     Allergies  Allergen Reactions  . Beta Adrenergic Blockers     Dizziness, low heart rate  . Codeine Nausea And Vomiting  . Trazodone And Nefazodone Other (See Comments)    insomnia    Patient Active Problem List   Diagnosis Date Noted  . Insomnia 10/30/2016  . Essential hypertension 10/10/2016  . Chronic bilateral low back pain with sciatica 10/10/2016  . GAD (generalized anxiety disorder) 10/10/2016  . GIST, malignant ([place]) 12/16/2013  . 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 Primary pancreatic neuroendocrine tumor 12/16/2013     Past Medical History:  Diagnosis Date  . Anxiety    x 20 y per pt  . Arthritis   . Depression   . Diverticulitis   . Family history of polyps in the colon   . Hay fever/allergies   . History of spinal fusion for scoliosis   . Hypertension   . Polycystic kidney      Past  Surgical History:  Procedure Laterality Date  . COLONOSCOPY WITH PROPOFOL N/A 11/22/2016   Procedure: COLONOSCOPY WITH PROPOFOL;  Surgeon: [name], MD;  Location: [facility] ENDOSCOPY;  Service: Endoscopy;  Laterality: N/A;  . KNEE ARTHROCENTESIS Right   . PANCREAS SURGERY  10/2013  . REMOVAL OF  GASTROINTESTINAL STOMATIC  TUMOR OF STOMACH  10/2013  . spleen removal  10/2013  . TUMOR REMOVAL     stomach, slpeen, and pancreas    Social History   Socioeconomic History  . Marital status: Married    Spouse name: Not on file  . Number of children: Not on file  . Years of education: Not on file  . Highest education level: Not on file  Social Needs  . Financial resource strain: Not on file  . Food insecurity - worry: Not on file  . Food insecurity - inability: Not on file  . Transportation needs - medical: Not on file  . Transportation needs - non-medical: Not on file  Occupational History  . Not on file  Tobacco Use  . Smoking status: Current Every Day Smoker    Years: 10.00    Types: Cigars  . Smokeless tobacco: Never Used  Substance and Sexual Activity  . Alcohol use: No  . Drug use: No  . Sexual activity: Yes  Other Topics Concern  . Not on file  Social History Narrative  . Not on file     Family History  Problem Relation Age of Onset  . Hypertension Mother   . Lymphoma Mother   . Hypertension Father   . Valvular heart disease Father   . Dementia Father   . Stroke Maternal Grandfather   . Prostate cancer Paternal Grandfather   . Coronary artery disease Sister   . Coronary artery disease Brother      Current Outpatient Medications:  .  Azilsartan Medoxomil (EDARBI) 40 MG TABS, Take 1 tablet by mouth daily., Disp: 90 tablet, Rfl: 1 .  clonazePAM (KLONOPIN) 1 MG tablet, Take 1 mg by mouth 2 (two) times daily., Disp: , Rfl:  .  diclofenac (VOLTAREN) 75 MG EC tablet, Take 1 tablet (75 mg total) by mouth 2 (two) times daily., Disp: 60 tablet, Rfl: 1 .  acetaminophen (TYLENOL) 500 MG tablet, Take 500 mg by mouth., Disp: , Rfl:  .  cetirizine (ZYRTEC) 10 MG tablet, Take 10 mg by mouth daily., Disp: , Rfl:  .  dicyclomine (BENTYL) 20 MG tablet, Take 1 tablet (20 mg total) by mouth 4 (four) times daily -  before meals and at bedtime. (Patient not taking: Reported  on 09/03/2017), Disp: 90 tablet, Rfl: 0 .  omeprazole (PRILOSEC) 20 MG capsule, Take 20 mg by mouth., Disp: , Rfl:  .  tadalafil (CIALIS) 5 MG tablet, Take 5 mg by mouth., Disp: , Rfl:    Physical exam:  Vitals:   09/03/17 1114  BP: 109/73  Pulse: 88  Resp: 18  Temp: (!) 95.9 F (35.5 C)  TempSrc: Tympanic  Weight: 205 lb 3.2 oz (93.1 kg)  Height: 6' 1.62" (1.87 m)   Physical Exam  Constitutional: He is oriented to person, place, and time and well-developed, well-nourished, and in no distress.  HENT:  Head: Normocephalic and atraumatic.  Eyes: EOM are normal. Pupils are equal, round, and reactive to light.  Neck: Normal range of motion.  Cardiovascular: Normal rate, regular rhythm and normal heart sounds.  Pulmonary/Chest: Effort normal and breath sounds normal.  Abdominal: Soft. Bowel sounds are normal.  Prior surgical scar of pancreatic surgery seen  Neurological: He is alert and oriented to person, place, and time.  Skin: Skin is warm and dry.       CMP Latest Ref Rng & Units 12/05/2016  Creatinine 0.61 - 1.24 mg/dL 1.10   CBC Latest Ref Rng & Units 09/03/2017  WBC 3.8 - 10.6 K/uL 11.2(H)  Hemoglobin 13.0 - 18.0 g/dL 15.9  Hematocrit 40.0 - 52.0 % 47.4  Platelets 150 - 440 K/uL 257     Assessment and plan- Patient is a 72 y.o. male with well-differentiated neuroendocrine tumor of the pancreas and GIST tumor of the stomach (incidentally found at the time of pancreatectomy) status post resection  Clinically patient is doing well and there is no evidence of recurrence on today's exam.  Since he is still within 5 years of his diagnoses I will repeat another CT abdomen and pelvis with contrast at this time.  Depending on the CT findings I will see him back in 6 months time with a CT scan prior.  I will also look into getting prior pathology reports since we do not have that for our review   Thank you for this kind referral and the opportunity to participate in the care of  this patient   Visit Diagnosis 1. Primary pancreatic neuroendocrine tumor   2. Benign gastrointestinal stromal tumor (GIST)     Dr. Randa Evens, MD, MPH [facility] at [facility] Pager- [phone] 09/05/2017  11:10 AM

## 2017-09-06 NOTE — Progress Notes (Signed)
Spoke with Coralyn Mark at [facility], [phone]. Medical record request faxed to [phone] with confirmation of receipt. Oncology Nurse Navigator Documentation  Navigator Location: [facility] (09/06/17 1000)   )Navigator Encounter Type: Telephone;Letter/Fax/Email (09/06/17 1000)                                                    Time Spent with Patient: 15 (09/06/17 1000)

## 2017-09-09 ENCOUNTER — Ambulatory Visit: Payer: Medicare Other | Admitting: Hematology and Oncology

## 2017-09-10 ENCOUNTER — Telehealth: Payer: Self-pay | Admitting: *Deleted

## 2017-09-10 ENCOUNTER — Ambulatory Visit
Admission: RE | Admit: 2017-09-10 | Discharge: 2017-09-10 | Disposition: A | Discharge status: Home / Self Care | Payer: Medicare Other | Source: Ambulatory Visit | Attending: Oncology | Admitting: Oncology

## 2017-09-10 DIAGNOSIS — D3A8 Other benign neuroendocrine tumors: Secondary | ICD-10-CM

## 2017-09-10 DIAGNOSIS — I700 Atherosclerosis of aorta: Secondary | ICD-10-CM | POA: Insufficient documentation

## 2017-09-10 DIAGNOSIS — I2510 Atherosclerotic heart disease of native coronary artery without angina pectoris: Secondary | ICD-10-CM | POA: Insufficient documentation

## 2017-09-10 MED ORDER — IOPAMIDOL (ISOVUE-300) INJECTION 61%
100.0000 mL | Freq: Once | INTRAVENOUS | Status: AC | PRN
  Administered 2017-09-10: 100 mL via INTRAVENOUS

## 2017-09-10 NOTE — Telephone Encounter (Signed)
Left message with patient on his voicemail that his scan was good did not see any cancer.  Asked patient to call me back so we could speak about his scan in detail and make another appointment for him for the future. will await his phone call.

## 2017-09-10 NOTE — Telephone Encounter (Signed)
-----   Message from Luella Cook, RN sent at 09/10/2017  4:13 PM EST -----   ----- Message ----- From: Sindy Guadeloupe, MD Sent: 09/10/2017   3:40 PM To: Luella Cook, RN  Ct scan shows no cancer. Repeat ct abodmen pelvis in 6 months. Bmp and I will see him after that. Thanks, Astrid Divine

## 2017-09-11 ENCOUNTER — Telehealth: Payer: Self-pay | Admitting: *Deleted

## 2017-09-11 NOTE — Telephone Encounter (Signed)
-----   Message from Wilburn Cornelia sent at 09/11/2017  8:38 AM EST ----- Regarding: CT results Contact: [phone] Pt called this AM just confirming his results are OK-He stated he thinks you tried to call him.

## 2017-09-11 NOTE — Telephone Encounter (Signed)
I called pt back and got voicemail and left message that I did get a message from [place] and he knows his scan is normal. I also said that we would like to scan him again in 6 months with labs prior to scan. And wanted to know if that is ok. Will await call back

## 2017-09-25 ENCOUNTER — Other Ambulatory Visit: Payer: Self-pay | Admitting: *Deleted

## 2017-09-25 DIAGNOSIS — D3A8 Other benign neuroendocrine tumors: Secondary | ICD-10-CM

## 2017-09-30 ENCOUNTER — Telehealth: Payer: Self-pay | Admitting: *Deleted

## 2017-09-30 NOTE — Telephone Encounter (Signed)
I have contacted patient and left messages about his ct scan result and the need for a another scan in  6 months and before he sees Dr. Janese Banks. [place] and did call me back after I left first message.  When he called he got voicemail and left me a message that he got my message and his scan was good.  But he never said yes or mentioned the future scan that he needs.  I have mailed a letter explaining it all and the scan that I have ordered for him for 9/23 and hopefully he will call me back and let me know if he can do scan or not.

## 2017-10-21 ENCOUNTER — Encounter: Payer: Self-pay | Admitting: Primary Care

## 2017-10-23 ENCOUNTER — Other Ambulatory Visit: Payer: Self-pay | Admitting: Primary Care

## 2017-10-23 DIAGNOSIS — I10 Essential (primary) hypertension: Secondary | ICD-10-CM

## 2017-12-23 ENCOUNTER — Encounter: Payer: Self-pay | Admitting: Urology

## 2017-12-23 ENCOUNTER — Ambulatory Visit (INDEPENDENT_AMBULATORY_CARE_PROVIDER_SITE_OTHER): Payer: Medicare Other | Admitting: Urology

## 2017-12-23 VITALS — BP 134/83 | HR 66 | Ht 73.62 in | Wt 214.2 lb

## 2017-12-23 DIAGNOSIS — N5201 Erectile dysfunction due to arterial insufficiency: Secondary | ICD-10-CM | POA: Diagnosis not present

## 2017-12-23 MED ORDER — TADALAFIL 20 MG PO TABS: ORAL_TABLET | ORAL | 0 refills | Status: DC

## 2017-12-23 NOTE — Progress Notes (Signed)
12/23/2017 2:29 PM   Bobby Little 02-Jan-1946 [id number]  Referring provider: Kirk Ruths, MD [place] [facility] [place], [place] [address]  Chief Complaint  Patient presents with  . Erectile Dysfunction    HPI: 72 year old male presents for erectile dysfunction.  He recently moved to the area from [place].  He was seen a urologist for ED and states he has been on sildenafil, Levitra and Cialis.  He felt the Levitra and Cialis works best but has become cost prohibitive.  He tried generic sildenafil but states it bottomed out his blood pressure.  He is not on oral nitrates.  He has no bothersome lower urinary tract symptoms.  He had a PSA January 2019 which was 0.57.   PMH: Past Medical History:  Diagnosis Date  . Anxiety    x 20 y per pt  . Arthritis   . Depression   . Diverticulitis   . Family history of polyps in the colon   . Hay fever/allergies   . History of spinal fusion for scoliosis   . Hypertension   . Polycystic kidney     Surgical History: Past Surgical History:  Procedure Laterality Date  . COLONOSCOPY WITH PROPOFOL N/A 11/22/2016   Procedure: COLONOSCOPY WITH PROPOFOL;  Surgeon: [name], MD;  Location: [facility] ENDOSCOPY;  Service: Endoscopy;  Laterality: N/A;  . KNEE ARTHROCENTESIS Right   . PANCREAS SURGERY  10/2013  . REMOVAL OF GASTROINTESTINAL STOMATIC  TUMOR OF STOMACH  10/2013  . spleen removal  10/2013  . TUMOR REMOVAL     stomach, slpeen, and pancreas    Home Medications:  Allergies as of 12/23/2017      Reactions   Beta Adrenergic Blockers    Dizziness, low heart rate   Codeine Nausea And Vomiting   Trazodone And Nefazodone Other (See Comments)   insomnia      Medication List        Accurate as of 12/23/17  2:29 PM. Always use your most recent med list.          acetaminophen 500 MG tablet Commonly known as:  TYLENOL Take 500 mg by mouth.   aspirin EC 81 MG tablet Take by mouth.     Azilsartan Medoxomil 40 MG Tabs Commonly known as:  EDARBI Take 1 tablet by mouth daily.   cetirizine 10 MG tablet Commonly known as:  ZYRTEC Take 10 mg by mouth daily.   clonazePAM 1 MG tablet Commonly known as:  KLONOPIN Take 1 mg by mouth 2 (two) times daily.   diclofenac 75 MG EC tablet Commonly known as:  VOLTAREN Take 1 tablet (75 mg total) by mouth 2 (two) times daily.   dicyclomine 20 MG tablet Commonly known as:  BENTYL Take 1 tablet (20 mg total) by mouth 4 (four) times daily -  before meals and at bedtime.   losartan 100 MG tablet Commonly known as:  COZAAR Take by mouth.   metoprolol succinate 50 MG 24 hr tablet Commonly known as:  TOPROL-XL Take by mouth.   omeprazole 20 MG capsule Commonly known as:  PRILOSEC Take 20 mg by mouth.       Allergies:  Allergies  Allergen Reactions  . Beta Adrenergic Blockers     Dizziness, low heart rate  . Codeine Nausea And Vomiting  . Trazodone And Nefazodone Other (See Comments)    insomnia    Family History: Family History  Problem Relation Age of Onset  .  Hypertension Mother   . Lymphoma Mother   . Hypertension Father   . Valvular heart disease Father   . Dementia Father   . Stroke Maternal Grandfather   . Prostate cancer Paternal Grandfather   . Coronary artery disease Sister   . Coronary artery disease Brother     Social History:  reports that he has been smoking cigars.  He has smoked for the past 10.00 years. He has never used smokeless tobacco. He reports that he does not drink alcohol or use drugs.  ROS: UROLOGY Frequent Urination?: Yes Hard to postpone urination?: No Burning/pain with urination?: No Get up at night to urinate?: No Leakage of urine?: No Urine stream starts and stops?: No Trouble starting stream?: No Do you have to strain to urinate?: No Blood in urine?: No Urinary tract infection?: No Sexually transmitted disease?: No Injury to kidneys or bladder?: No Painful  intercourse?: No Weak stream?: No Erection problems?: Yes Penile pain?: No  Gastrointestinal Nausea?: No Vomiting?: No Indigestion/heartburn?: Yes Diarrhea?: No Constipation?: Yes  Constitutional Fever: No Night sweats?: No Weight loss?: No Fatigue?: Yes  Skin Skin rash/lesions?: Yes Itching?: No  Eyes Blurred vision?: No Double vision?: No  Ears/Nose/Throat Sore throat?: No Sinus problems?: Yes  Hematologic/Lymphatic Swollen glands?: No Easy bruising?: Yes  Cardiovascular Leg swelling?: No Chest pain?: No  Respiratory Cough?: Yes Shortness of breath?: No  Endocrine Excessive thirst?: No  Musculoskeletal Back pain?: Yes Joint pain?: Yes  Neurological Headaches?: No Dizziness?: No  Psychologic Depression?: No Anxiety?: Yes  Physical Exam: BP 134/83 (BP Location: Left Arm, Patient Position: Sitting, Cuff Size: Large)   Pulse 66   Ht 6' 1.62" (1.87 m)   Wt 214 lb 3.2 oz (97.2 kg)   SpO2 99%   BMI 27.79 kg/m   Constitutional:  Alert and oriented, No acute distress. HEENT: [place] AT, moist mucus membranes.  Trachea midline, no masses. Cardiovascular: No clubbing, cyanosis, or edema. Respiratory: Normal respiratory effort, no increased work of breathing. GI: Abdomen is soft, nontender, nondistended, no abdominal masses GU: No CVA tenderness Lymph: No cervical or inguinal lymphadenopathy. Skin: No rashes, bruises or suspicious lesions. Neurologic: Grossly intact, no focal deficits, moving all 4 extremities. Psychiatric: Normal mood and affect.   Assessment & Plan:   72 year old male with erectile dysfunction.  I did send an Rx for generic tadalafil to his pharmacy and he will check on the pricing.  We will also check on pricing of compounded tadalafil and let him know.  We did discuss the availability of intracavernosal injections however he is not interested in pursuing at present.  Follow-up annually or as needed.   Return in about 1 year  (around 12/24/2018) for Recheck.  Abbie Sons, [place] [address] [place], [place] [address] [phone]

## 2018-01-06 ENCOUNTER — Telehealth: Payer: Self-pay

## 2018-01-06 NOTE — Telephone Encounter (Signed)
-----   Message from Abbie Sons, MD sent at 01/06/2018  7:16 AM EDT ----- Please let patient know the best price I found on Cialis was generic Cialis from a mail order pharmacy in [place] which is $15 per tablet.  I can send a prescription electronically and they will ship to him if he desires.

## 2018-02-21 IMAGING — CT CT ABD-PELV W/ CM
1 of 3 series · 13 of 32 positions shown, 18 images · IV contrast (APPLIED)
Comparison: None.  Prior outside CTs have not been obtained.

ADDENDUM:
Comparison CT from [facility] [REDACTED] dated 03/24/2014 have been
obtained.

The large right renal cyst has minimally increased in size, now
measuring 10.5 cm in greatest diameter, previously 9.9 cm.
No other changes noted.
CLINICAL DATA: 70-year-old male with mid abdominal bulge and pain
since 0053 following removal of benign tumor in the pancreatic tail.
EXAM:
CT ABDOMEN AND PELVIS WITH CONTRAST
TECHNIQUE: Multidetector CT imaging of the abdomen and pelvis was performed
using the standard protocol following bolus administration of
intravenous contrast.
CONTRAST:  100mL KNPEPQ-MTT IOPAMIDOL (KNPEPQ-MTT) INJECTION 61%

[Series 2: axial st · axial · 0.80mm/px · z∈[-1057,-627]mm · 13 of 98 slices shown, 18 images]
[im 6/98  soft-tissue]
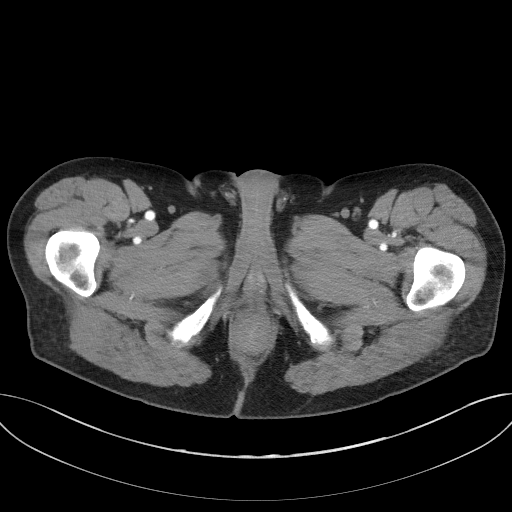
[im 6/98  bone]
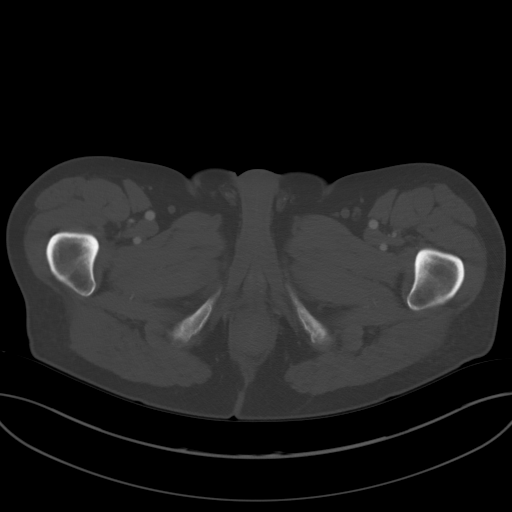
[im 17/98  soft-tissue]
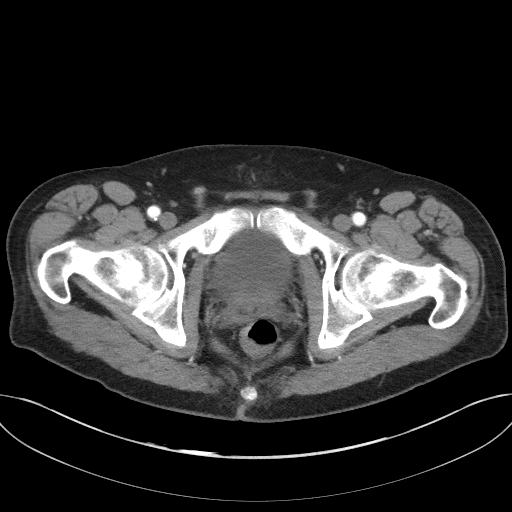
[im 22/98  soft-tissue]
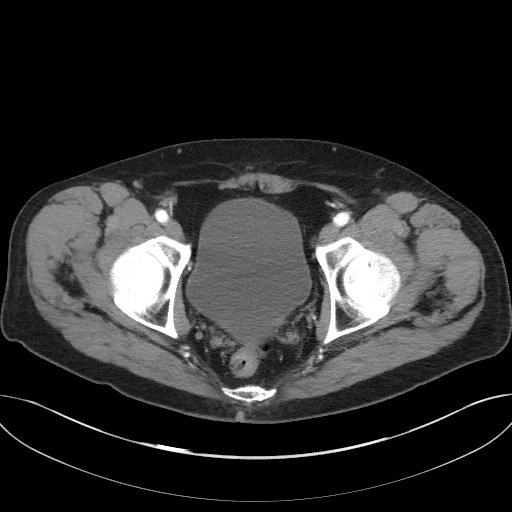
[im 27/98  soft-tissue]
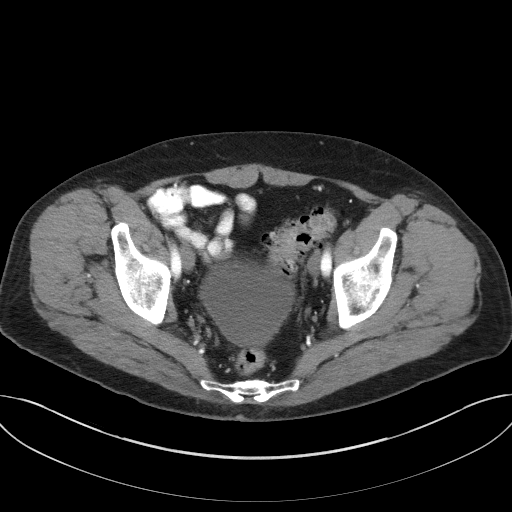
[im 38/98  soft-tissue]
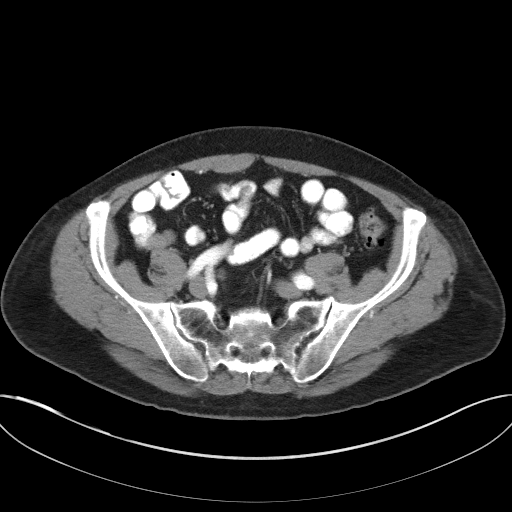
[im 44/98  soft-tissue]
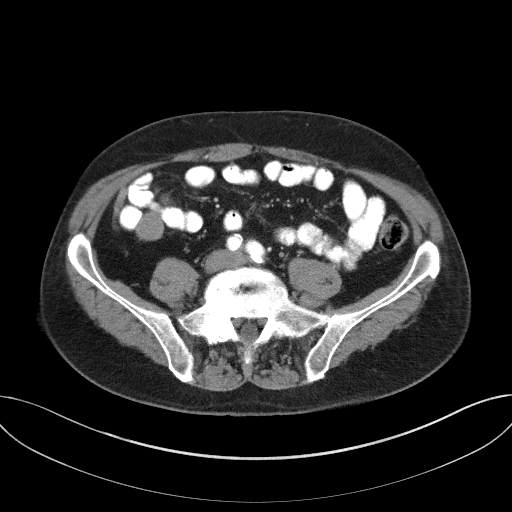
[im 54/98  soft-tissue]
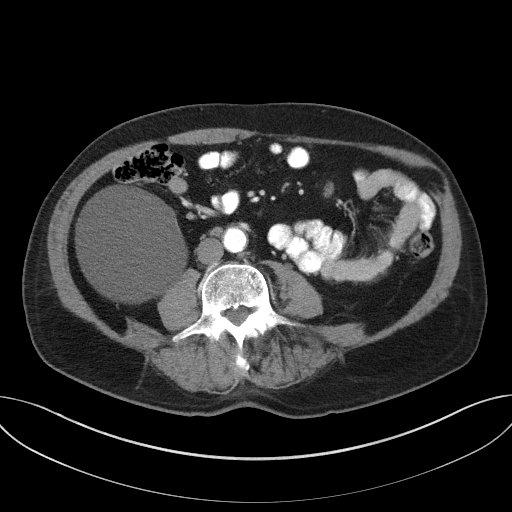
[im 60/98  soft-tissue]
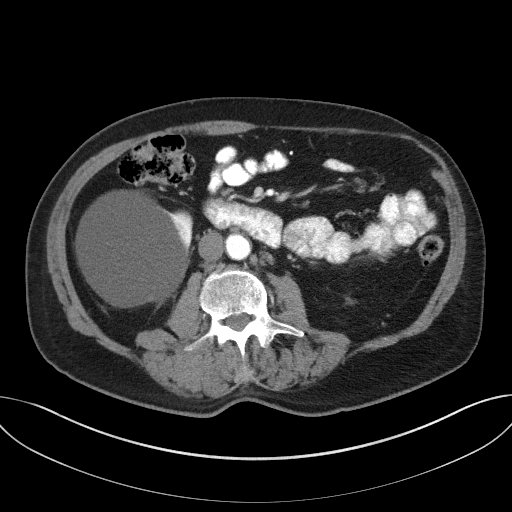
[im 71/98  soft-tissue]
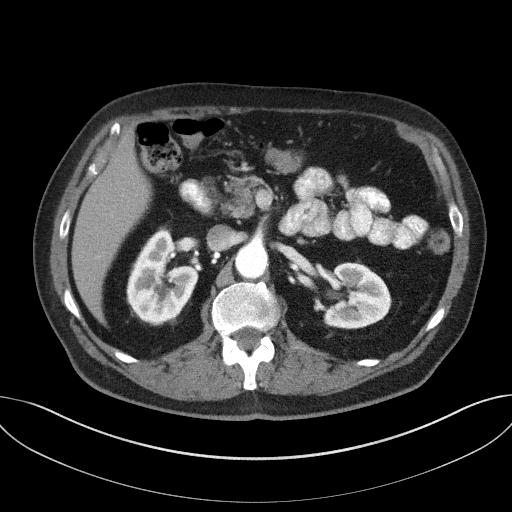
[im 71/98  bone]
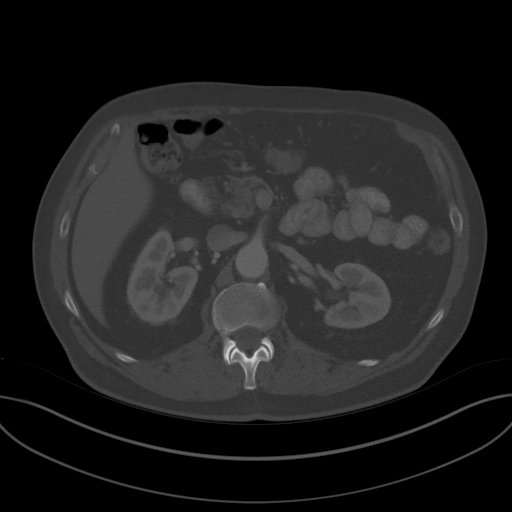
[im 76/98  soft-tissue]
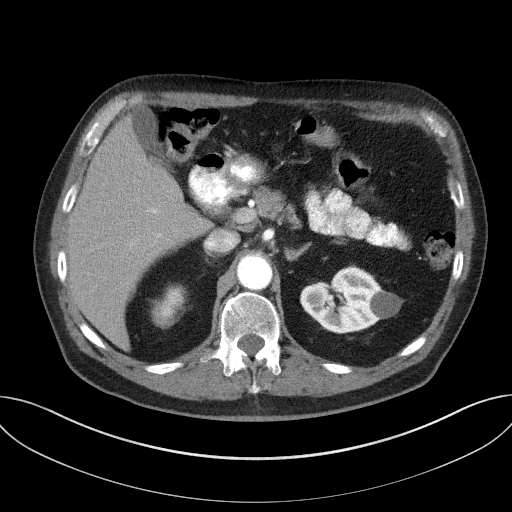
[im 76/98  lung]
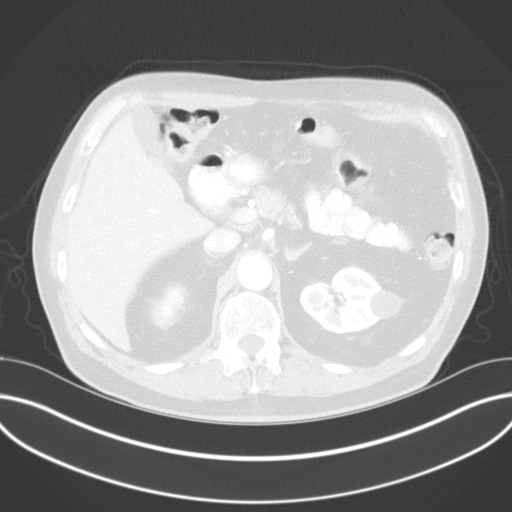
[im 81/98  soft-tissue]
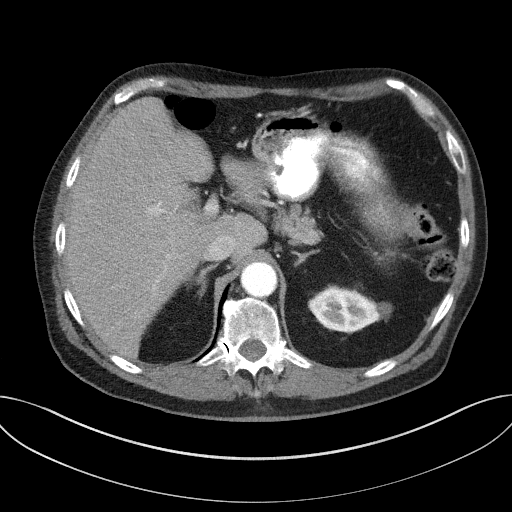
[im 81/98  lung]
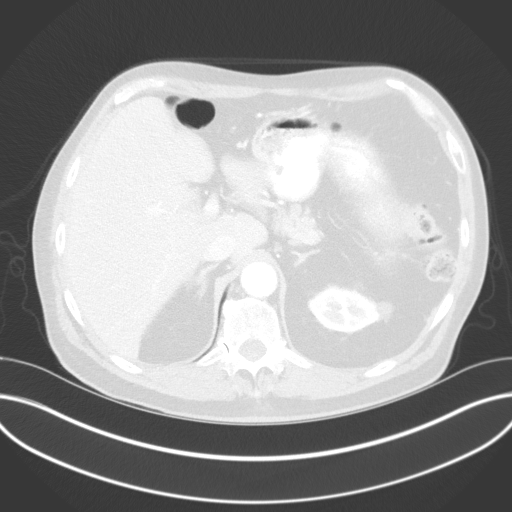
[im 87/98  lung]
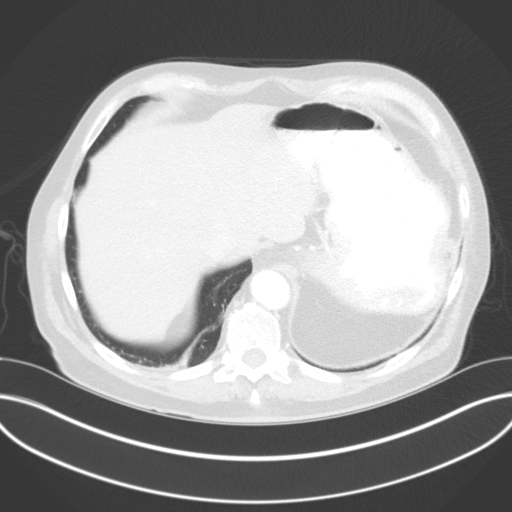
[im 92/98  soft-tissue]
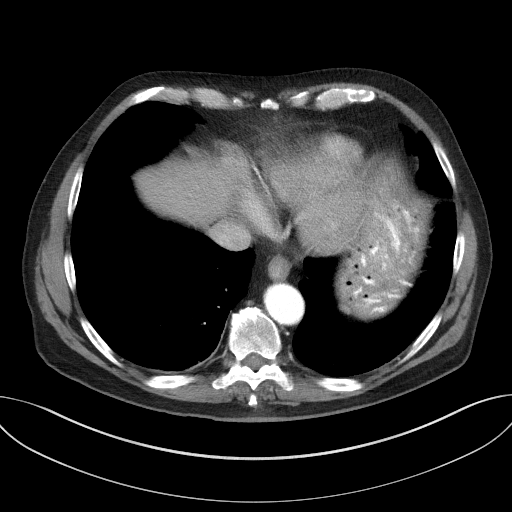
[im 92/98  lung]
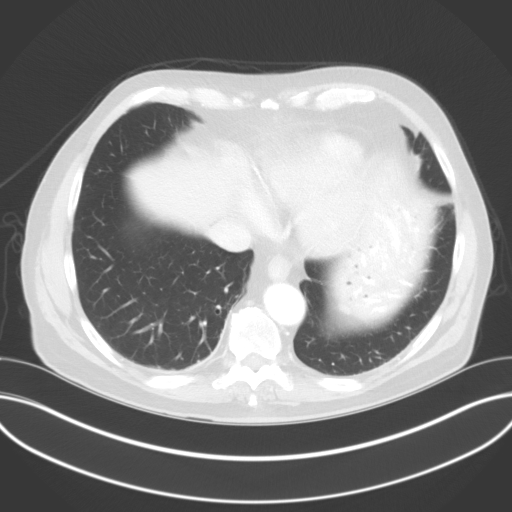

[13 of 32 positions shown; findings below may reference images not displayed]

FINDINGS: Lower chest: Cardiomegaly noted.  No acute abnormality.

Hepatobiliary: An anterior hepatic cyst is present. The liver and
gallbladder are otherwise unremarkable.

Pancreas: Postoperative changes along the pancreatic tail noted. The
remainder of the pancreas is unremarkable.

Spleen: Patient is status post splenectomy.

Adrenals/Urinary Tract: A 9 x 10.5 cm benign cyst extending off of
the lower right kidney is noted. Other smaller left renal cysts are
noted. There is no evidence of hydronephrosis or solid renal mass.
The adrenal glands and bladder are unremarkable.

Stomach/Bowel: Stomach is within normal limits. Appendix appears
normal. No evidence of bowel wall thickening, distention, or
inflammatory changes. Colonic diverticulosis noted without evidence
of diverticulitis.

Vascular/Lymphatic: Abdominal aortic atherosclerotic calcifications
noted without aneurysm. No enlarged lymph nodes identified.

Reproductive: Mild prostate enlargement noted.

Other: No abdominal wall hernia, ascites, abscess or
pneumoperitoneum. There is thinning of the left anterior abdominal
wall compatible with postoperative changes.

Musculoskeletal: No acute or suspicious abnormalities. Mild to
moderate degenerative disc disease in the lower lumbar spine noted.
IMPRESSION: 9 x 10.5 cm benign cyst extending off of the lower right kidney.
Correlate with location of patient's bulge.

No acute abnormalities or other abnormalities to suggest a cause for
this patient's symptoms.

Cardiomegaly.

Abdominal aortic atherosclerosis.

## 2018-03-26 ENCOUNTER — Telehealth: Payer: Self-pay | Admitting: *Deleted

## 2018-03-26 NOTE — Telephone Encounter (Signed)
Pt called to say that he came home early and got my message as well as the appt. Schedule and on 9/23 he has a MD appt so he would like me to change the appt for lab and ct scan to another day. He is acceptable about 9/20 for labs and scan so I had it changed to 9/20 9:15 for labs at cancer center and 10:15 arrival at [place] location for the scan. He was instructed to pick up prep at least one day ahead of time. And NPO for 4 hours prior to scan. I will also mail him the schedule. He is agreeable to above and had not additional questions

## 2018-04-11 ENCOUNTER — Inpatient Hospital Stay: Payer: Medicare Other | Attending: Oncology

## 2018-04-11 ENCOUNTER — Ambulatory Visit
Admission: RE | Admit: 2018-04-11 | Discharge: 2018-04-11 | Disposition: A | Discharge status: Home / Self Care | Payer: Medicare Other | Source: Ambulatory Visit | Attending: Oncology | Admitting: Oncology

## 2018-04-11 DIAGNOSIS — Z85028 Personal history of other malignant neoplasm of stomach: Secondary | ICD-10-CM | POA: Insufficient documentation

## 2018-04-11 DIAGNOSIS — Z90411 Acquired partial absence of pancreas: Secondary | ICD-10-CM | POA: Insufficient documentation

## 2018-04-11 DIAGNOSIS — Z8589 Personal history of malignant neoplasm of other organs and systems: Secondary | ICD-10-CM | POA: Insufficient documentation

## 2018-04-11 DIAGNOSIS — D3A8 Other benign neuroendocrine tumors: Secondary | ICD-10-CM

## 2018-04-11 DIAGNOSIS — Z9081 Acquired absence of spleen: Secondary | ICD-10-CM | POA: Insufficient documentation

## 2018-04-11 LAB — BASIC METABOLIC PANEL
Anion gap: 9 (ref 5–15)
BUN: 20 mg/dL (ref 8–23)
CALCIUM: 9 mg/dL (ref 8.9–10.3)
CO2: 25 mmol/L (ref 22–32)
CREATININE: 1.02 mg/dL (ref 0.61–1.24)
Chloride: 106 mmol/L (ref 98–111)
GFR calc Af Amer: >60 mL/min (ref >60)
GFR calc non Af Amer: >60 mL/min (ref >60)
GLUCOSE: 107 mg/dL — AB (ref 70–99)
Potassium: 4.7 mmol/L (ref 3.5–5.1)
Sodium: 140 mmol/L (ref 135–145)

## 2018-04-11 MED ORDER — IOPAMIDOL (ISOVUE-300) INJECTION 61%
100.0000 mL | Freq: Once | INTRAVENOUS | Status: AC | PRN
  Administered 2018-04-11: 100 mL via INTRAVENOUS

## 2018-04-14 ENCOUNTER — Other Ambulatory Visit: Payer: Medicare Other

## 2018-04-14 ENCOUNTER — Ambulatory Visit: Payer: Medicare Other

## 2018-04-15 ENCOUNTER — Encounter: Payer: Self-pay | Admitting: Oncology

## 2018-04-15 ENCOUNTER — Inpatient Hospital Stay (HOSPITAL_BASED_OUTPATIENT_CLINIC_OR_DEPARTMENT_OTHER): Payer: Medicare Other | Admitting: Oncology

## 2018-04-15 VITALS — BP 128/83 | HR 72 | Temp 97.9°F | Resp 18 | Ht 73.62 in | Wt 209.3 lb

## 2018-04-15 DIAGNOSIS — Z85028 Personal history of other malignant neoplasm of stomach: Secondary | ICD-10-CM | POA: Diagnosis not present

## 2018-04-15 DIAGNOSIS — M545 Low back pain: Secondary | ICD-10-CM | POA: Diagnosis not present

## 2018-04-15 DIAGNOSIS — Z8589 Personal history of malignant neoplasm of other organs and systems: Secondary | ICD-10-CM

## 2018-04-15 DIAGNOSIS — Z85048 Personal history of other malignant neoplasm of rectum, rectosigmoid junction, and anus: Secondary | ICD-10-CM

## 2018-04-15 DIAGNOSIS — G8929 Other chronic pain: Secondary | ICD-10-CM | POA: Diagnosis not present

## 2018-04-15 DIAGNOSIS — Z720 Tobacco use: Secondary | ICD-10-CM

## 2018-04-15 DIAGNOSIS — D3A8 Other benign neuroendocrine tumors: Secondary | ICD-10-CM

## 2018-04-15 NOTE — Progress Notes (Signed)
No new changes noted today 

## 2018-04-17 NOTE — Progress Notes (Signed)
Hematology/Oncology Consult note [facility]  Telephone:([phone] [phone]  Patient Care Team: Kirk Ruths, MD as PCP - General (Internal Medicine)   Name of the patient: Bobby Little  [id number]  04-02-1946   Date of visit: 04/17/18  Diagnosis- 1. H/o gastric GIST 2. 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  This was in [place].  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  At the time of doing pancreatic to me patient was also found to have a pedunculated gist tumor of the anterior gastric wall which was resected.  He underwent a partial gastrectomy at the same time of distal pancreatectomy on 11/13/2013.  He has been in remission since.  Last CT abdomen and pelvis with contrast in May 2018 revealed postoperative changes in the pancreatic tail but the remainder of the pancreas was unremarkable.  10 cm benign cyst in the lower right kidney was noted.  No other concerning findings were noted.   Interval history-his appetite is good and he denies any unintentional weight loss.  Denies any abdominal pain or diarrhea.  He does have chronic back pain which waxes and wanes and is currently bothering him.  He is also concerned about left abdominal wall swelling which she has had since the time of his surgery  ECOG PS- 1 Pain scale- 0 Opioid associated constipation- no  Review of systems- Review of Systems  Constitutional: Positive for malaise/fatigue. Negative for chills, fever and weight loss.  HENT: Negative for congestion, ear discharge and nosebleeds.     Eyes: Negative for blurred vision.  Respiratory: Negative for cough, hemoptysis, sputum production, shortness of breath and wheezing.   Cardiovascular: Negative for chest pain, palpitations, orthopnea and claudication.  Gastrointestinal: Negative for abdominal pain, blood in stool, constipation, diarrhea, heartburn, melena, nausea and vomiting.  Genitourinary: Negative for dysuria, flank pain, frequency, hematuria and urgency.  Musculoskeletal: Positive for back pain. Negative for joint pain and myalgias.  Skin: Negative for rash.  Neurological: Negative for dizziness, tingling, focal weakness, seizures, weakness and headaches.  Endo/Heme/Allergies: Does not bruise/bleed easily.  Psychiatric/Behavioral: Negative for depression and suicidal ideas. The patient does not have insomnia.       Allergies  Allergen Reactions  . Beta Adrenergic Blockers     Dizziness, low heart rate  . Codeine Nausea And Vomiting  . Trazodone And Nefazodone Other (See Comments)    insomnia     Past Medical History:  Diagnosis Date  . Anxiety    x 20 y per pt  . Arthritis   . Depression   . Diverticulitis   . Family history of polyps in the colon   . Hay fever/allergies   . History of spinal fusion for scoliosis   . Hypertension   . Polycystic kidney      Past Surgical History:  Procedure Laterality Date  . COLONOSCOPY WITH PROPOFOL N/A 11/22/2016   Procedure: COLONOSCOPY WITH PROPOFOL;  Surgeon: [name], MD;  Location: [facility] ENDOSCOPY;  Service: Endoscopy;  Laterality: N/A;  . KNEE ARTHROCENTESIS Right   . PANCREAS SURGERY  10/2013  . REMOVAL OF GASTROINTESTINAL STOMATIC  TUMOR OF STOMACH  10/2013  .  spleen removal  10/2013  . TUMOR REMOVAL     stomach, slpeen, and pancreas    Social History   Socioeconomic History  . Marital status: Married    Spouse name: Not on file  . Number of children: Not on file  . Years of education: Not on file  . Highest education level: Not on file   Occupational History  . Not on file  Social Needs  . Financial resource strain: Not on file  . Food insecurity:    Worry: Not on file    Inability: Not on file  . Transportation needs:    Medical: Not on file    Non-medical: Not on file  Tobacco Use  . Smoking status: Current Some Day Smoker    Years: 10.00    Types: Cigars  . Smokeless tobacco: Never Used  Substance and Sexual Activity  . Alcohol use: No  . Drug use: No  . Sexual activity: Yes  Lifestyle  . Physical activity:    Days per week: Not on file    Minutes per session: Not on file  . Stress: Not on file  Relationships  . Social connections:    Talks on phone: Not on file    Gets together: Not on file    Attends religious service: Not on file    Active member of club or organization: Not on file    Attends meetings of clubs or organizations: Not on file    Relationship status: Not on file  . Intimate partner violence:    Fear of current or ex partner: Not on file    Emotionally abused: Not on file    Physically abused: Not on file    Forced sexual activity: Not on file  Other Topics Concern  . Not on file  Social History Narrative  . Not on file    Family History  Problem Relation Age of Onset  . Hypertension Mother   . Lymphoma Mother   . Hypertension Father   . Valvular heart disease Father   . Dementia Father   . Stroke Maternal Grandfather   . Prostate cancer Paternal Grandfather   . Coronary artery disease Sister   . Coronary artery disease Brother      Current Outpatient Medications:  .  acetaminophen (TYLENOL) 500 MG tablet, Take 500 mg by mouth., Disp: , Rfl:  .  aspirin EC 81 MG tablet, Take by mouth., Disp: , Rfl:  .  Azilsartan Medoxomil (EDARBI) 40 MG TABS, Take 1 tablet by mouth daily., Disp: 90 tablet, Rfl: 1 .  cetirizine (ZYRTEC) 10 MG tablet, Take 10 mg by mouth daily., Disp: , Rfl:  .  clonazePAM (KLONOPIN) 1 MG tablet, Take 1 mg by mouth 2 (two) times daily., Disp: , Rfl:   .  diclofenac (VOLTAREN) 75 MG EC tablet, Take 1 tablet (75 mg total) by mouth 2 (two) times daily., Disp: 60 tablet, Rfl: 1 .  dicyclomine (BENTYL) 20 MG tablet, Take 1 tablet (20 mg total) by mouth 4 (four) times daily -  before meals and at bedtime., Disp: 90 tablet, Rfl: 0 .  losartan (COZAAR) 100 MG tablet, Take by mouth., Disp: , Rfl:  .  metoprolol succinate (TOPROL-XL) 50 MG 24 hr tablet, Take by mouth., Disp: , Rfl:  .  omeprazole (PRILOSEC) 20 MG capsule, Take 20 mg by mouth., Disp: , Rfl:  .  tadalafil (ADCIRCA/CIALIS) 20 MG tablet, 1 tablet 30 minutes prior to intercourse not to exceed 1  daily (Patient not taking: Reported on 04/15/2018), Disp: 10 tablet, Rfl: 0  Physical exam:  Vitals:   04/15/18 1431  BP: 128/83  Pulse: 72  Resp: 18  Temp: 97.9 F (36.6 C)  TempSrc: Tympanic  Weight: 209 lb 4.8 oz (94.9 kg)  Height: 6' 1.62" (1.87 m)   Physical Exam  Constitutional: He is oriented to person, place, and time. He appears well-developed and well-nourished.  He ambulates with a cane  HENT:  Head: Normocephalic and atraumatic.  Eyes: Pupils are equal, round, and reactive to light. EOM are normal.  Neck: Normal range of motion.  Cardiovascular: Normal rate, regular rhythm and normal heart sounds.  Pulmonary/Chest: Effort normal and breath sounds normal.  Abdominal: Soft. Bowel sounds are normal.  Left abdominal wall swelling likely due to defect in transverse abdominis muscle  Neurological: He is alert and oriented to person, place, and time.  Skin: Skin is warm and dry.     CMP Latest Ref Rng & Units 04/11/2018  Glucose 70 - 99 mg/dL 107(H)  BUN 8 - 23 mg/dL 20  Creatinine 0.61 - 1.24 mg/dL 1.02  Sodium 135 - 145 mmol/L 140  Potassium 3.5 - 5.1 mmol/L 4.7  Chloride 98 - 111 mmol/L 106  CO2 22 - 32 mmol/L 25  Calcium 8.9 - 10.3 mg/dL 9.0   CBC Latest Ref Rng & Units 09/03/2017  WBC 3.8 - 10.6 K/uL 11.2(H)  Hemoglobin 13.0 - 18.0 g/dL 15.9  Hematocrit 40.0 - 52.0  % 47.4  Platelets 150 - 440 K/uL 257    No images are attached to the encounter.  Ct Abdomen Pelvis W Contrast  Result Date: 04/11/2018 CLINICAL DATA:  Follow-up distal pancreatectomy for neuroendocrine tumor EXAM: CT ABDOMEN AND PELVIS WITH CONTRAST TECHNIQUE: Multidetector CT imaging of the abdomen and pelvis was performed using the standard protocol following bolus administration of intravenous contrast. CONTRAST:  12mL ISOVUE-300 IOPAMIDOL (ISOVUE-300) INJECTION 61% COMPARISON:  None. FINDINGS: Lower chest: Lung bases are clear. Hepatobiliary: 2.0 cm cyst in segment 4A (series 2/image 16), previously 1.6 cm. 1.8 cm cyst in segment 2 (series 2/image 9), unchanged. Gallbladder is unremarkable. No intrahepatic or extrahepatic ductal dilatation. Pancreas: Status post distal pancreatectomy. No pancreatic mass atrophy. Spleen: Surgically absent. Adrenals/Urinary Tract: Adrenal glands within normal limits. Bilateral renal cysts, measuring up to 11.2 cm in the right lower pole (series 2/image 42), unchanged. No hydronephrosis. Bladder is within normal limits. Stomach/Bowel: Stomach is within normal limits. No evidence of bowel obstruction. Normal appendix (series 2/image 62). Sigmoid diverticulosis, without evidence of diverticulitis. Vascular/Lymphatic: No evidence of abdominal aortic aneurysm. Atherosclerotic calcifications of the abdominal aorta and branch vessels. No suspicious abdominopelvic lymphadenopathy. Reproductive: Prostate is unremarkable. Other: No abdominopelvic ascites. Musculoskeletal: Degenerative changes of the visualized thoracolumbar spine, most prominent at L5-S1. IMPRESSION: Status post distal pancreatectomy and splenectomy. No evidence of recurrent or metastatic disease. Electronically Signed   By: Julian Hy M.D.   On: 04/11/2018 12:44     Assessment and plan- Patient is a 73 y.o. male with well-differentiated neuroendocrine tumor of the pancreas and GIST tumor of the stomach  (incidentally found at the time of pancreatectomy) status post resection. He is here for routine surveillance for the same  I have reviewed CT abdomen images independently.  Currently there is no evidence of recurrence of his malignancy.  Also his gist tumor of the stomach was incidentally found and resected at the time of pancreatic to me and does not require any follow-up at this time  given that it was a small tumor.  Patient will continue getting surveillance scans at this time but I will get that once a year and I will see him back in 1 year with scans prior    Visit Diagnosis 1. Primary pancreatic neuroendocrine tumor      Dr. Randa Evens, MD, MPH [facility] [phone] 04/17/2018 9:44 AM

## 2018-05-01 ENCOUNTER — Other Ambulatory Visit: Payer: Self-pay | Admitting: Neurosurgery

## 2018-05-01 DIAGNOSIS — M51369 Other intervertebral disc degeneration, lumbar region without mention of lumbar back pain or lower extremity pain: Secondary | ICD-10-CM

## 2018-05-01 DIAGNOSIS — M5136 Other intervertebral disc degeneration, lumbar region: Secondary | ICD-10-CM

## 2018-05-16 ENCOUNTER — Ambulatory Visit
Admission: RE | Admit: 2018-05-16 | Discharge: 2018-05-16 | Disposition: A | Discharge status: Home / Self Care | Payer: Medicare Other | Source: Ambulatory Visit | Attending: Neurosurgery | Admitting: Neurosurgery

## 2018-05-16 DIAGNOSIS — M5136 Other intervertebral disc degeneration, lumbar region: Secondary | ICD-10-CM | POA: Diagnosis present

## 2018-05-16 DIAGNOSIS — M8938 Hypertrophy of bone, other site: Secondary | ICD-10-CM | POA: Diagnosis not present

## 2018-05-16 DIAGNOSIS — M48061 Spinal stenosis, lumbar region without neurogenic claudication: Secondary | ICD-10-CM | POA: Diagnosis not present

## 2018-08-14 ENCOUNTER — Other Ambulatory Visit: Payer: Self-pay

## 2018-08-14 ENCOUNTER — Ambulatory Visit: Payer: Medicare Other | Attending: Family Medicine

## 2018-08-14 DIAGNOSIS — R262 Difficulty in walking, not elsewhere classified: Secondary | ICD-10-CM | POA: Insufficient documentation

## 2018-08-14 DIAGNOSIS — G8929 Other chronic pain: Secondary | ICD-10-CM | POA: Insufficient documentation

## 2018-08-14 DIAGNOSIS — M545 Low back pain: Secondary | ICD-10-CM | POA: Insufficient documentation

## 2018-08-14 NOTE — Therapy (Signed)
[facility] [address], [place], [address] Phone: [phone]   Fax:  [phone]  Physical Therapy Evaluation  Patient Details  Name: Bobby Little MRN: [id number] Date of Birth: 10-May-1946 Referring Provider (PT): Allene Dillon, NP   Encounter Date: 08/14/2018  PT End of Session - 08/14/18 0933    Visit Number  1    Number of Visits  13    Date for PT Re-Evaluation  09/25/18    Authorization Type  1    Authorization Time Period  of 10 progress report    PT Start Time  0934    PT Stop Time  1035    PT Time Calculation (min)  61 min    Activity Tolerance  Patient tolerated treatment well    Behavior During Therapy  [facility] for tasks assessed/performed       Past Medical History:  Diagnosis Date  . Anxiety    x 20 y per pt  . Arthritis   . Depression   . Diverticulitis   . Family history of polyps in the colon   . Hay fever/allergies   . History of spinal fusion for scoliosis   . Hypertension   . Polycystic kidney     Past Surgical History:  Procedure Laterality Date  . COLONOSCOPY WITH PROPOFOL N/A 11/22/2016   Procedure: COLONOSCOPY WITH PROPOFOL;  Surgeon: [name], MD;  Location: [facility] ENDOSCOPY;  Service: Endoscopy;  Laterality: N/A;  . KNEE ARTHROCENTESIS Right   . PANCREAS SURGERY  10/2013  . REMOVAL OF GASTROINTESTINAL STOMATIC  TUMOR OF STOMACH  10/2013  . spleen removal  10/2013  . TUMOR REMOVAL     stomach, slpeen, and pancreas    There were no vitals filed for this visit.   Subjective Assessment - 08/14/18 0939    Subjective  LBP (low back to mid thoracic spine and B hips L > R): 7/10 currently (pt sitting on a chair), 10/10 at most for the past 3 months, 3/10 at best for the past 3 months    Pertinent History  LBP. Had PT years ago for back pain. Has a bulge on his L side due to an operation to remove the tail of his pancreas. Also had part of his stomach as well as his spleen  removed. The surgery was 4 years ago. Currently has to wear a brace to protect the bulge.   Immune system is compromised due to his slpeen removal.  Has a 20 $ copay for PT.  Back pain began about 15 years ago. Had x-ray which revealed that his spine was curved and has spinal stenosis. Neurosurgeon did not want to operate on his back.  Had shots in his back November 2019 which made it worse.  Feels like his back pain is progressively worsening since 2 years ago.  The PT years ago for his back helped.  No loss of bowel or bladder control.  Has numbness R ankle but feels like it might be due to his menisectomy. Uses a knee brace for his R knee and the ankle numbness goes away.  Denies saddle anesthesia.  Was deer hunting Thanksgiving 1980 and his deer stand broke and broke his L wrist, L ribs, and injured his L hip.  Uses a SPC occasionally such as at his trip to [place] last August 2019.    Back pain does not radiate down his LE.     Patient Stated Goals  Be able  to have a normal life where he can work and do things outside more comfortably.    Currently in Pain?  Yes    Pain Score  7     Pain Location  Back    Pain Orientation  Left;Right;Lower;Mid    Pain Descriptors / Indicators  Sore    Pain Type  Chronic pain    Pain Onset  More than a month ago    Pain Frequency  Constant    Aggravating Factors   walking or standing for about 15-20 minutes, stair negotiation (going down stairs > going up stairs), using his push mower and riding mower.     Pain Relieving Factors  hydocodone, diclofenac, tylenol, sitting in his recliner with a heating pad on his back. Prone position         [facility] PT Assessment - 08/14/18 0936      Assessment   Medical Diagnosis  DDD lumbar, lumbar radiculitis, lumbar stenosis with neurogenic claudication, osteoarthritis of spine with radiculopathy, lumbar region    Referring Provider (PT)  Allene Dillon, NP    Onset Date/Surgical Date  08/06/18   Date PT referral signed.  Chronic condition   Prior Therapy  Had PT for his back years ago with good results.       Precautions   Precaution Comments  retrolisthesis on L3/4 and L5/S1 (MRI on 05/16/18); hx of CA      Restrictions   Other Position/Activity Restrictions  no known weight bearing restrictions      Balance Screen   Has the patient fallen in the past 6 months  No    Has the patient had a decrease in activity level because of a fear of falling?   No    Is the patient reluctant to leave their home because of a fear of falling?   No      Prior Function   Vocation  Retired    Leisure  travel      Observation/Other Assessments   Observations  Long sit test suggests anterior nutation of L innominate.       Posture/Postural Control   Posture Comments  slight L lateral shift, slight L low back rotation, slight  R low back side bend from L5 to L4, L low back side bend from L3 to L1. B protracted shoulder, protracted neck,  R genu varus      AROM   Lumbar Flexion  WFL    Lumbar Extension  WFL    Lumbar - Right Side [facility] with low back and L posterior lateral trunk pain    Lumbar - Left Side Bend  limited with L low back pain    Lumbar - Right Rotation  WFL   performed in sitting   Lumbar - Left Rotation  WFL   performed in sitting     Strength   Right Hip Flexion  4/5    Right Hip Extension  5/5   seated manually resisted   Right Hip ABduction  5/5   seated clamshell   Left Hip Flexion  4/5    Left Hip Extension  5/5   seated manually resisted   Left Hip ABduction  5/5   seated clamshell   Right Knee Flexion  5/5    Right Knee Extension  5/5    Left Knee Flexion  5/5    Left Knee Extension  5/5      Palpation   Palpation comment  Slight TTP L posterior  hip. Muscle tension lumbar paraspinal muscles R > L.       Ambulation/Gait   Gait Comments  antalgic, decreased stance L LE initially, then decreased stance R LE                Objective measurements completed on  examination: See above findings.         Pt states his BP is controlled most of the time.  Blood pressure: L arm sitting, mechanically taken: 141/95, 62 BPM   Therapeutic exercise  Seated L hip extension isometrics 10x5 seconds for 3 sets  Decreased back pain.   Reviewed and given as part of his HEP. Pt demonstrated and verbalized understanding. Handout provided.   seated R trunk rotation 10x5 seconds for 2 sets   Improved exercise technique, movement at target joints, use of target muscles after mod verbal, visual, tactile cues.    Patient is a 73 year old male who came to physical therapy secondary to chronic low back pain. He also presents with altered posture and gait pattern, positive special test suggesting lumbopelvic involvement, reproduction of symptoms with L > R trunk side bending, lumbar paraspinal muscle tension, and difficulty performing functional tasks such as ambulating or standing for long periods due to back pain. Patient will benefit from skilled physical therapy services to address the aforementioned deficits.                   PT Education - 08/14/18 1438    Education Details  ther-ex, HEP, plan of care    Person(s) Educated  Patient    Methods  Explanation;Tactile cues;Demonstration;Verbal cues;Handout    Comprehension  Verbalized understanding;Returned demonstration       PT Short Term Goals - 08/14/18 1423      PT SHORT TERM GOAL #1   Title  Patient will be independent with his HEP to decrease pain, improve strength and function.     Baseline  Pt has started his HEP (08/14/2018)    Time  3    Period  Weeks    Status  New    Target Date  09/04/18        PT Long Term Goals - 08/14/18 1424      PT LONG TERM GOAL #1   Title  Patient will have a decrease in back pain to 5/10 or less at worst to promote ability to ambulate longer distances as well as improve ability to perform standing tasks longer.     Baseline  10/10 back pain at  worst for the past 3 months (08/14/2018)    Time  6    Period  Weeks    Status  New    Target Date  09/25/18      PT LONG TERM GOAL #2   Title  Pt will report improved standing and/or walking tolerance to 25 minutes or more to promote mobility, function, and ability to travel.     Baseline  15-20 minute standing tolerance (08/14/2018)    Time  6    Period  Weeks    Status  New    Target Date  09/25/18      PT LONG TERM GOAL #3   Title  Patient will be able to ascend and descend 4 regular steps with B UE assist at least 3 times with minimal to no complain of back pain to promote ability to go from one level to another in a building or home.     Baseline  Increased back pain with stair negotiation (going down stairs greater than going up stairs) 08/14/2018.     Time  6    Period  Weeks    Status  New    Target Date  09/25/18             Plan - 08/14/18 1419    Clinical Impression Statement  Patient is a 73 year old male who came to physical therapy secondary to chronic low back pain. He also presents with altered posture and gait pattern, positive special test suggesting lumbopelvic involvement, reproduction of symptoms with L > R trunk side bending, lumbar paraspinal muscle tension, and difficulty performing functional tasks such as ambulating or standing for long periods due to back pain. Patient will benefit from skilled physical therapy services to address the aforementioned deficits.     History and Personal Factors relevant to plan of care:  Chronicity of condition, difficulty walking or standing for long periods, complex medical history, bulge on L trunk area which may affect trunk strengthening.     Clinical Presentation  Evolving    Clinical Presentation due to:  Back pain is gradually getting worse base on subjective reports.     Clinical Decision Making  Moderate    Rehab Potential  Fair    Clinical Impairments Affecting Rehab Potential  (-) age, chronicity of condition,  complex medical history, bulge on L trunk; (+) motivated    PT Frequency  2x / week    PT Duration  6 weeks    PT Treatment/Interventions  Therapeutic exercise;Neuromuscular re-education;Therapeutic activities;Gait training;Functional mobility training;Balance training;Patient/family education;Manual techniques;Dry needling;Aquatic Therapy;Electrical Stimulation;Iontophoresis 4mg /ml Dexamethasone    PT Next Visit Plan  thoracic extension, scapular and glute strengthening, posture, manual techniques, modalities PRN    Consulted and Agree with Plan of Care  Patient       Patient will benefit from skilled therapeutic intervention in order to improve the following deficits and impairments:  Pain, Postural dysfunction, Improper body mechanics, Difficulty walking  Visit Diagnosis: Chronic bilateral low back pain, unspecified whether sciatica present - Plan: PT plan of care cert/re-cert  Difficulty in walking, not elsewhere classified - Plan: PT plan of care cert/re-cert     Problem List Patient Active Problem List   Diagnosis Date Noted  . Insomnia 10/30/2016  . Essential hypertension 10/10/2016  . Chronic bilateral low back pain with sciatica 10/10/2016  . GAD (generalized anxiety disorder) 10/10/2016  . GIST, malignant ([place]) 12/16/2013  . 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 Primary pancreatic neuroendocrine tumor 12/16/2013   Joneen Boers PT, DPT   08/14/2018, 3:09 PM  [facility] [address], [place], [address] Phone: [phone]   Fax:  [phone]  Name: Bobby Little MRN: [id number] Date of Birth: Dec 16, 1945

## 2018-08-14 NOTE — Patient Instructions (Signed)
  Seated hip extension isometrics   Sitting on a chair,    Squeeze your rear end muscles together and press your L thigh down on the pillow.     Hold for 5 seconds    Repeat 10 times   Perform 3 sets daily.      This is a corrective exercise. Once you no longer have symptoms, you can stop.

## 2018-08-19 ENCOUNTER — Ambulatory Visit: Payer: Medicare Other

## 2018-08-27 ENCOUNTER — Ambulatory Visit: Payer: Medicare Other

## 2018-12-26 ENCOUNTER — Ambulatory Visit: Payer: Medicare Other | Admitting: Urology

## 2019-02-10 ENCOUNTER — Ambulatory Visit: Payer: Medicare Other | Admitting: Urology

## 2019-04-11 ENCOUNTER — Encounter: Payer: Self-pay | Admitting: Oncology

## 2019-04-13 ENCOUNTER — Other Ambulatory Visit: Payer: Self-pay | Admitting: Oncology

## 2019-04-13 DIAGNOSIS — Z8509 Personal history of malignant neoplasm of other digestive organs: Secondary | ICD-10-CM

## 2019-04-13 NOTE — Telephone Encounter (Signed)
Called pt and he had got the scan r/s to 10/30, just needed to change the appt for Rao-moved it to 11/2 at 2:45. Pt agreeable to this

## 2019-04-14 ENCOUNTER — Ambulatory Visit: Payer: Medicare Other

## 2019-04-14 ENCOUNTER — Ambulatory Visit: Payer: Medicare Other | Admitting: Urology

## 2019-04-14 ENCOUNTER — Ambulatory Visit: Admission: RE | Admit: 2019-04-14 | Payer: PRIVATE HEALTH INSURANCE | Source: Ambulatory Visit

## 2019-04-15 ENCOUNTER — Ambulatory Visit: Payer: Medicare Other | Admitting: Urology

## 2019-04-16 ENCOUNTER — Ambulatory Visit: Payer: Medicare Other | Admitting: Oncology

## 2019-05-22 ENCOUNTER — Other Ambulatory Visit: Payer: PRIVATE HEALTH INSURANCE

## 2019-05-25 ENCOUNTER — Ambulatory Visit: Payer: PRIVATE HEALTH INSURANCE | Admitting: Oncology

## 2019-05-25 ENCOUNTER — Ambulatory Visit: Payer: Medicare Other | Admitting: Urology

## 2019-09-07 ENCOUNTER — Other Ambulatory Visit: Payer: Self-pay

## 2019-09-07 ENCOUNTER — Ambulatory Visit: Payer: Medicare Other | Attending: Internal Medicine

## 2019-09-07 DIAGNOSIS — Z23 Encounter for immunization: Secondary | ICD-10-CM | POA: Insufficient documentation

## 2019-09-07 NOTE — Progress Notes (Signed)
   Covid-19 Vaccination Clinic  Name:  Bobby Little    MRN: [id number] DOB: [birth year]  09/07/2019  [name] was observed post Covid-19 immunization for 15 minutes without incidence. He was provided with Vaccine Information Sheet and instruction to access the V-Safe system.   [name] was instructed to call 911 with any severe reactions post vaccine: Marland Kitchen Difficulty breathing  . Swelling of your face and throat  . A fast heartbeat  . A bad rash all over your body  . Dizziness and weakness    Immunizations Administered    Name Date Dose VIS Date Route   Pfizer COVID-19 Vaccine 09/07/2019  9:42 AM 0.3 mL 07/03/2019 Intramuscular   Manufacturer: [place]   Lot: [id number]   [place]: [id number]

## 2019-10-06 ENCOUNTER — Ambulatory Visit: Payer: Medicare Other | Attending: Internal Medicine

## 2019-10-06 DIAGNOSIS — Z23 Encounter for immunization: Secondary | ICD-10-CM

## 2019-10-06 NOTE — Progress Notes (Signed)
   Covid-19 Vaccination Clinic  Name:  Bobby Little    MRN: [id number] DOB: [birth year]  10/06/2019  Bobby Little was observed post Covid-19 immunization for 15 minutes without incident. He was provided with Vaccine Information Sheet and instruction to access the V-Safe system.   Bobby Little was instructed to call 911 with any severe reactions post vaccine: Marland Kitchen Difficulty breathing  . Swelling of face and throat  . A fast heartbeat  . A bad rash all over body  . Dizziness and weakness   Immunizations Administered    Name Date Dose VIS Date Route   Pfizer COVID-19 Vaccine 10/06/2019  9:55 AM 0.3 mL 07/03/2019 Intramuscular   Manufacturer: [place]   Lot: [id number]   [place]: [id number]

## 2020-02-08 ENCOUNTER — Other Ambulatory Visit: Payer: Self-pay

## 2020-02-08 ENCOUNTER — Ambulatory Visit (INDEPENDENT_AMBULATORY_CARE_PROVIDER_SITE_OTHER): Payer: Medicare Other | Admitting: Gastroenterology

## 2020-02-08 VITALS — BP 122/78 | HR 80 | Temp 98.0°F | Ht 73.0 in | Wt 217.0 lb

## 2020-02-08 DIAGNOSIS — R1032 Left lower quadrant pain: Secondary | ICD-10-CM | POA: Diagnosis not present

## 2020-02-08 DIAGNOSIS — C49A2 Gastrointestinal stromal tumor of stomach: Secondary | ICD-10-CM | POA: Diagnosis not present

## 2020-02-08 NOTE — Progress Notes (Signed)
Bobby Bellows MD, MRCP(U.K) [address]  [place]  [place], [place] [address]  Main: [phone]  Fax: [phone]   Gastroenterology Consultation  Referring Provider:     Kirk Ruths, MD Primary Care Physician:  Kirk Ruths, MD Primary Gastroenterologist:  Dr. Jonathon Little  Reason for Consultation:     GIST        HPI:   Bobby Little is a 74 y.o. y/o male referred for consultation & management  by Dr. Ouida Sills, Ocie Cornfield, MD. 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 since then had been in remission. He was last seen by Dr Janese Banks in Oncology back in 03/2018 and at that time plan was to get surveillance scans once a year and see him back in 2020 . Scans were cancelled by the patient  .  I performed his colonoscopy back in 2018 which was normal.  The main reason he says today he has come to see me as pain in the left lower quadrant.  On and off.  Worse prior to bowel movement.  This is just below the scar he has from prior surgery.  Complains of fatigue.  No other complaint.   Past Medical History:  Diagnosis Date  . Anxiety    x 20 y per pt  . Arthritis   . Depression   . Diverticulitis   . Family history of polyps in the colon   . Hay fever/allergies   . History of spinal fusion for scoliosis   . Hypertension   . Polycystic kidney     Past Surgical History:  Procedure Laterality Date  . COLONOSCOPY WITH PROPOFOL N/A 11/22/2016   Procedure: COLONOSCOPY WITH PROPOFOL;  Surgeon: Bobby Bellows, MD;  Location: [facility] ENDOSCOPY;  Service: Endoscopy;  Laterality: N/A;  . KNEE ARTHROCENTESIS Right   . PANCREAS SURGERY  10/2013  . REMOVAL OF GASTROINTESTINAL STOMATIC  TUMOR OF STOMACH  10/2013  . spleen removal  10/2013  . TUMOR REMOVAL     stomach, slpeen, and pancreas    Prior to  Admission medications   Medication Sig Start Date End Date Taking? Authorizing Provider  acetaminophen (TYLENOL) 500 MG tablet Take 500 mg by mouth.    [provider]  aspirin EC 81 MG tablet Take by mouth.    [provider]  Azilsartan Medoxomil (EDARBI) 40 MG TABS Take 1 tablet by mouth daily. 01/11/17   Pleas Koch, NP  cetirizine (ZYRTEC) 10 MG tablet Take 10 mg by mouth daily.    [provider]  clonazePAM (KLONOPIN) 1 MG tablet Take 1 mg by mouth 2 (two) times daily.    [provider]  diclofenac (VOLTAREN) 75 MG EC tablet Take 1 tablet (75 mg total) by mouth 2 (two) times daily. 10/10/16   Pleas Koch, NP  dicyclomine (BENTYL) 20 MG tablet Take 1 tablet (20 mg total) by mouth 4 (four) times daily -  before meals and at bedtime. 10/30/16   Pleas Koch, NP  HYDROcodone-acetaminophen (NORCO/VICODIN) 5-325 MG tablet Take 1 tablet by mouth.    [provider]  losartan (COZAAR) 100 MG tablet Take by mouth.    [provider]  metoprolol succinate (TOPROL-XL) 50 MG 24 hr tablet Take by mouth.    [provider]  omeprazole (PRILOSEC) 20 MG capsule Take 20  mg by mouth.    [provider]  tadalafil (ADCIRCA/CIALIS) 20 MG tablet 1 tablet 30 minutes prior to intercourse not to exceed 1 daily 12/23/17   Stoioff, Ronda Fairly, MD    Family History  Problem Relation Age of Onset  . Hypertension Mother   . Lymphoma Mother   . Hypertension Father   . Valvular heart disease Father   . Dementia Father   . Stroke Maternal Grandfather   . Prostate cancer Paternal Grandfather   . Coronary artery disease Sister   . Coronary artery disease Brother      Social History   Tobacco Use  . Smoking status: Current Some Day Smoker    Years: 10.00    Types: Cigars  . Smokeless tobacco: Never Used  Vaping Use  . Vaping Use: Never used  Substance Use Topics  . Alcohol use: No  . Drug use: No    Allergies as of  02/08/2020 - Review Complete 04/15/2018  Allergen Reaction Noted  . Beta adrenergic blockers  10/10/2016  . Codeine Nausea And Vomiting   . Trazodone and nefazodone Other (See Comments) 04/04/2017    Review of Systems:    All systems reviewed and negative except where noted in HPI.   Physical Exam:  There were no vitals taken for this visit. No LMP for male patient. Psych:  Alert and cooperative. Normal mood and affect. General:   Alert,  Well-developed, well-nourished, pleasant and cooperative in NAD Head:  Normocephalic and atraumatic. Eyes:  Sclera clear, no icterus.   Conjunctiva pink. Ears:  Normal auditory acuity. Lungs:  Respirations even and unlabored.  Clear throughout to auscultation.   No wheezes, crackles, or rhonchi. No acute distress. Heart:  Regular rate and rhythm; no murmurs, clicks, rubs, or gallops. Abdomen: Scars noted in the left side of the abdomen from prior surgery.  No hernias noted normal bowel sounds.  No bruits.  Soft, non-tender and non-distended without masses, hepatosplenomegaly or hernias noted.  No guarding or rebound tenderness.    Neurologic:  Alert and oriented x3;  grossly normal neurologically. Psych:  Alert and cooperative. Normal mood and affect.  Imaging Studies: No results found.  Assessment and Plan:   Bobby Little is a 74 y.o. y/o male has a history of gastric and distal pancreatic GIST tumor S/p resection of distal pancreas, spleen and partial gastrectomy  Back in 2015.  Main complaint is discomfort in the left lower part of his abdomen.  His history is suggestive of possible pain related to scar tissue from prior surgeries.  No other red flag signs.  I have personally discussed the issue of surveillance with Dr. Janese Banks at the cancer center and she will arrange for follow-up appointment.  Plan 1.  CT scan of the abdomen and pelvis with contrast to check for recurrence of GIST tumor. 2.  Trial of IBgard.  If fails can try Bentyl or  amitriptyline.  Next visit can also consider adding some MiraLAX in case he suffers from some constipation due to scar tissue and changing of the anorectal angle in the left lower quadrant.  Follow up in 4 to 6 weeks telephone visit  Dr Bobby Bellows MD,MRCP(U.K)

## 2020-02-09 ENCOUNTER — Other Ambulatory Visit: Payer: Self-pay

## 2020-02-09 DIAGNOSIS — C49A2 Gastrointestinal stromal tumor of stomach: Secondary | ICD-10-CM

## 2020-02-09 DIAGNOSIS — R1032 Left lower quadrant pain: Secondary | ICD-10-CM

## 2020-02-17 ENCOUNTER — Ambulatory Visit
Admission: RE | Admit: 2020-02-17 | Discharge: 2020-02-17 | Disposition: A | Discharge status: Home / Self Care | Payer: Medicare Other | Source: Ambulatory Visit | Attending: Gastroenterology | Admitting: Gastroenterology

## 2020-02-17 ENCOUNTER — Other Ambulatory Visit: Payer: Self-pay

## 2020-02-17 DIAGNOSIS — C49A2 Gastrointestinal stromal tumor of stomach: Secondary | ICD-10-CM

## 2020-02-17 DIAGNOSIS — R1032 Left lower quadrant pain: Secondary | ICD-10-CM | POA: Diagnosis present

## 2020-02-17 LAB — POCT I-STAT CREATININE: Creatinine, Ser: 1 mg/dL (ref 0.61–1.24)

## 2020-02-17 MED ORDER — IOHEXOL 300 MG/ML  SOLN
100.0000 mL | Freq: Once | INTRAMUSCULAR | Status: AC | PRN
  Administered 2020-02-17: 100 mL via INTRAVENOUS

## 2020-02-29 ENCOUNTER — Telehealth: Payer: Self-pay

## 2020-02-29 NOTE — Telephone Encounter (Signed)
-----   Message from [name], MD sent at 02/29/2020 10:51 AM EDT ----- No recurrence of tumor on CT scan

## 2020-03-10 ENCOUNTER — Encounter: Payer: Self-pay | Admitting: Oncology

## 2020-03-10 ENCOUNTER — Inpatient Hospital Stay: Payer: Medicare Other | Attending: Oncology | Admitting: Oncology

## 2020-03-10 ENCOUNTER — Other Ambulatory Visit: Payer: Self-pay

## 2020-03-10 VITALS — BP 134/81 | HR 63 | Temp 98.1°F | Resp 16 | Ht 73.0 in | Wt 219.0 lb

## 2020-03-10 DIAGNOSIS — F17210 Nicotine dependence, cigarettes, uncomplicated: Secondary | ICD-10-CM | POA: Insufficient documentation

## 2020-03-10 DIAGNOSIS — D3A8 Other benign neuroendocrine tumors: Secondary | ICD-10-CM | POA: Diagnosis not present

## 2020-03-10 DIAGNOSIS — Z8507 Personal history of malignant neoplasm of pancreas: Secondary | ICD-10-CM | POA: Diagnosis present

## 2020-03-10 DIAGNOSIS — R1032 Left lower quadrant pain: Secondary | ICD-10-CM | POA: Insufficient documentation

## 2020-03-10 DIAGNOSIS — Z8509 Personal history of malignant neoplasm of other digestive organs: Secondary | ICD-10-CM | POA: Insufficient documentation

## 2020-03-10 NOTE — Progress Notes (Signed)
Pt has arthritis pain and nothing has helped in the line of getting inj. Tried voltaren , mobic and currently take tylenol and ibupofen for some relief. He eats good and and has BM but sometimes he leans in and puts pressure to get BM out. He has trouble sleeping and uses clonazepam.

## 2020-03-10 NOTE — Progress Notes (Signed)
Hematology/Oncology Consult note [facility]  [phone] [phone]  Patient Care Team: Kirk Ruths, MD as PCP - General (Internal Medicine)   Name of the patient: Bobby Little  [id number]  09-07-1945   Date of visit: 03/10/20  Diagnosis-  1. H/o gastric GIST 2. 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 This was in [place]. 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 At the time of doing pancreatic to me patient was also found to have a pedunculated gist tumor of the anterior gastric wall which was resected. He underwent a partial gastrectomy at the same time ofdistal pancreatectomy on 11/13/2013.  With regards to his wrist final pathology showed a 2.5 cm tumor with no lymphovascular or perineural invasion.  Margins were negative.  Splenectomy specimen showed no malignancy.  Partial pancreatectomy showed well-differentiated pancreatic neuroendocrine neoplasm 1.4 x 1.3 x 1.1 cm.  No lymphatic or perineural invasion.He has been in remission since. Last CT abdomen and pelvis with contrast in May 2018 revealed postoperative changes in the pancreatic tail but the remainder of the pancreas was unremarkable. 10 cm benign cyst in the lower right kidney was noted. No other concerning findings were noted.    Interval history-patient reports pain in his left lower quadrant in his abdomen.  States that sometimes he has to press on that area to have a good bowel movement.  He had a recent CT scan which did not show any acute pathology  except Sigmoid diverticulosis.  ECOG PS- 1 Pain scale- 3   Review of systems- Review of Systems  Constitutional: Positive for malaise/fatigue. Negative for chills, fever and weight loss.  HENT: Negative for congestion, ear discharge and nosebleeds.   Eyes: Negative for blurred vision.  Respiratory: Negative for cough, hemoptysis, sputum production, shortness of breath and wheezing.   Cardiovascular: Negative for chest pain, palpitations, orthopnea and claudication.  Gastrointestinal: Positive for abdominal pain. Negative for blood in stool, constipation, diarrhea, heartburn, melena, nausea and vomiting.  Genitourinary: Negative for dysuria, flank pain, frequency, hematuria and urgency.  Musculoskeletal: Negative for back pain, joint pain and myalgias.  Skin: Negative for rash.  Neurological: Negative for dizziness, tingling, focal weakness, seizures, weakness and headaches.  Endo/Heme/Allergies: Does not bruise/bleed easily.  Psychiatric/Behavioral: Negative for depression and suicidal ideas. The patient does not have insomnia.       Allergies  Allergen Reactions  . Beta Adrenergic Blockers     Dizziness, low heart rate  . Codeine Nausea And Vomiting  . Trazodone And Nefazodone Other (See Comments)    insomnia     Past Medical History:  Diagnosis Date  . Anxiety    x 20 y per pt  . Arthritis   . Depression   . Diverticulitis   . Family history of polyps in the colon   . Gastrointestinal stromal tumor (GIST) ([place])   . GIST (gastrointestinal stroma tumor), malignant, colon ([place])   . GIST (gastrointestinal stroma tumor), malignant, colon ([place])   . Hay fever/allergies   . History of spinal fusion for scoliosis   . Hypertension   . Polycystic kidney      Past Surgical History:  Procedure Laterality Date  . COLONOSCOPY WITH PROPOFOL N/A 11/22/2016   Procedure: COLONOSCOPY WITH PROPOFOL;  Surgeon: [name], MD;  Location: [facility] ENDOSCOPY;  Service: Endoscopy;  Laterality:  N/A;  . KNEE ARTHROCENTESIS Right   . PANCREAS SURGERY  10/2013  . REMOVAL OF GASTROINTESTINAL STOMATIC  TUMOR OF STOMACH  10/2013  . spleen removal  10/2013  . TUMOR REMOVAL     stomach, slpeen, and pancreas    Social History   Socioeconomic History  . Marital status: Married    Spouse name: Not on file  . Number of children: Not on file  . Years of education: Not on file  . Highest education level: Not on file  Occupational History  . Not on file  Tobacco Use  . Smoking status: Current Some Day Smoker    Years: 10.00    Types: Cigars  . Smokeless tobacco: Never Used  . Tobacco comment: 1 cigar 2-3 times year  Vaping Use  . Vaping Use: Never used  Substance and Sexual Activity  . Alcohol use: No  . Drug use: No  . Sexual activity: Yes  Other Topics Concern  . Not on file  Social History Narrative  . Not on file   Social Determinants of Health   Financial Resource Strain:   . Difficulty of Paying Living Expenses: Not on file  Food Insecurity:   . Worried About Charity fundraiser in the Last Year: Not on file  . Ran Out of Food in the Last Year: Not on file  Transportation Needs:   . Lack of Transportation (Medical): Not on file  . Lack of Transportation (Non-Medical): Not on file  Physical Activity:   . Days of Exercise per Week: Not on file  . Minutes of Exercise per Session: Not on file  Stress:   . Feeling of Stress : Not on file  Social Connections:   . Frequency of Communication with Friends and Family: Not on file  . Frequency of Social Gatherings with Friends and Family: Not on file  . Attends Religious Services: Not on file  . Active Member of Clubs or Organizations: Not on file  . Attends Archivist Meetings: Not on file  . Marital Status: Not on file  Intimate Partner Violence:   . Fear of Current or Ex-Partner: Not on file  . Emotionally Abused: Not on file  . Physically Abused: Not on file  . Sexually Abused: Not on file     Family History  Problem Relation Age of Onset  . Hypertension Mother   . Lymphoma Mother   . Hypertension Father   . Valvular heart disease Father   . Dementia Father   . Stroke Maternal Grandfather   . Prostate cancer Paternal Grandfather   . Coronary artery disease Sister   . Coronary artery disease Brother      Current Outpatient Medications:  .  acetaminophen (TYLENOL) 500 MG tablet, Take 500 mg by mouth., Disp: , Rfl:  .  atorvastatin (LIPITOR) 20 MG tablet, Take 20 mg by mouth daily., Disp: , Rfl:  .  clonazePAM (KLONOPIN) 1 MG tablet, Take 1 mg by mouth 2 (two) times daily., Disp: , Rfl:  .  olmesartan (BENICAR) 40 MG tablet, , Disp: , Rfl:  .  Azilsartan Medoxomil (EDARBI) 40 MG TABS, Take 1 tablet by mouth daily., Disp: 90 tablet, Rfl: 1  Physical exam:  Vitals:   03/10/20 1022  BP: 134/81  Pulse: 63  Resp: 16  Temp: 98.1 F (36.7 C)  TempSrc: Oral  Weight: 219 lb (99.3 kg)  Height: 6\' 1"  (1.854 m)   Physical Exam Constitutional:      General: He is not in acute distress. Cardiovascular:     Rate and Rhythm: Normal rate and regular rhythm.     Heart sounds: Normal heart sounds.  Pulmonary:     Effort: Pulmonary effort is normal.     Breath sounds: Normal breath sounds.  Abdominal:     General: Bowel sounds are normal.     Palpations: Abdomen is soft.     Comments: No tenderness to palpation  Skin:    General: Skin is warm and dry.  Neurological:     Mental Status: He is alert and oriented to person, place, and time.      CMP Latest Ref Rng & Units 02/17/2020  Glucose 70 - 99 mg/dL -  BUN 8 - 23 mg/dL -  Creatinine 0.61 - 1.24 mg/dL 1.00  Sodium 135 - 145 mmol/L -  Potassium 3.5 - 5.1 mmol/L -  Chloride 98 - 111 mmol/L -  CO2 22 - 32 mmol/L -  Calcium 8.9 - 10.3 mg/dL -   CBC Latest Ref Rng & Units 09/03/2017  WBC 3.8 - 10.6 K/uL 11.2(H)  Hemoglobin 13.0 - 18.0 g/dL 15.9  Hematocrit 40 - 52 % 47.4  Platelets 150 - 440 K/uL 257    No  images are attached to the encounter.  CT Abdomen Pelvis W Contrast  Result Date: 02/17/2020 CLINICAL DATA:  74 year old male with left lower quadrant abdominal pain. Status post prior distal pancreatectomy for neuroendocrine tumor. EXAM: CT ABDOMEN AND PELVIS WITH CONTRAST TECHNIQUE: Multidetector CT imaging of the abdomen and pelvis was performed using the standard protocol following bolus administration of intravenous contrast. CONTRAST:  163mL OMNIPAQUE IOHEXOL 300 MG/ML  SOLN COMPARISON:  CT abdomen pelvis dated 04/11/2018. FINDINGS: Lower chest: Minimal bibasilar linear atelectasis/scarring. The visualized lung bases are otherwise clear. Coronary vascular calcifications noted. There is no intra-abdominal free air or free fluid. Hepatobiliary: Stable hepatic cyst measuring up to 2 cm. No intrahepatic biliary ductal dilatation. No calcified gallstone or pericholecystic fluid. Pancreas: Postsurgical changes of distal pancreatectomy. The head and body of the pancreas appear unremarkable. Spleen: Splenectomy. Adrenals/Urinary Tract: The adrenal glands are unremarkable. There is no hydronephrosis on either side. There is symmetric enhancement and excretion of contrast by both kidneys. There is an 11 cm right renal inferior pole cyst. Several additional smaller cysts noted in the left kidney. Multiple subcentimeter hypodense lesions are too small to characterize. The visualized ureters and urinary bladder appear unremarkable. Stomach/Bowel: There is sigmoid diverticulosis without active inflammatory changes. There is no bowel obstruction or active inflammation. The appendix is normal. Vascular/Lymphatic: Moderate aortoiliac atherosclerotic disease. The IVC is unremarkable. No portal venous gas. There is no adenopathy. Reproductive: The prostate and seminal vesicles are grossly unremarkable. Other: None Musculoskeletal: Degenerative changes of the spine. No acute osseous pathology. IMPRESSION: 1. No acute  intra-abdominal or pelvic pathology. 2. Sigmoid diverticulosis. No bowel obstruction. Normal appendix. 3. Postsurgical changes of distal pancreatectomy and splenectomy. No evidence of recurrent or metastatic disease. 4. Aortic Atherosclerosis (ICD10-I70.0). Electronically Signed   By: Anner Crete M.D.   On: 02/17/2020 23:46     Assessment and plan- Patient is a 74 y.o. male with history of GIST and pancreatic neuroendocrine tumor s/p resection here for routine follow-up  1.  It has been more than 5 years since his diagnosis of  GIST and pancreatic neuroendocrine tumor which does not require any surveillance imaging or lab work at this time.  His most recent CT scan on 02/17/2020 reveal any acute pathology.  He does not require any oncology follow-up at this time  2.  With regards to his chronic left lower quadrant abdominal pain, patient is asking if surgical opinion is warranted.  CT scan did not show any hernia which would require surgical intervention.  I will defer this to Dr. Vicente Males and Dr. Ouida Sills   Visit Diagnosis 1. Primary pancreatic neuroendocrine tumor   2. History of gastrointestinal stromal tumor (GIST)      Dr. Randa Evens, MD, MPH [facility] [phone] 03/10/2020 3:37 PM

## 2020-03-22 ENCOUNTER — Telehealth (INDEPENDENT_AMBULATORY_CARE_PROVIDER_SITE_OTHER): Payer: Medicare Other | Admitting: Gastroenterology

## 2020-03-22 DIAGNOSIS — R1032 Left lower quadrant pain: Secondary | ICD-10-CM

## 2020-03-22 MED ORDER — DICYCLOMINE HCL 10 MG PO CAPS: 10.0000 mg | ORAL_CAPSULE | Freq: Three times a day (TID) | ORAL | 0 refills | Status: DC

## 2020-03-22 NOTE — Progress Notes (Signed)
Bobby Little , MD [address]  [place]  [place], [place] [address]  Main: [phone]  Fax: [phone]   Primary Care Physician: Bobby Ruths, MD  Virtual Visit via Telephone Note  I connected with patient on 03/22/20 at  3:30 PM EDT by telephone and verified that I am speaking with the correct person using two identifiers.   I discussed the limitations, risks, security and privacy concerns of performing an evaluation and management service by telephone and the availability of in person appointments. I also discussed with the patient that there may be a patient responsible charge related to this service. The patient expressed understanding and agreed to proceed.  Location of Patient: Home Location of Provider: Home Persons involved: Patient and provider only   History of Present Illness:   Abdominal pain follow-up  HPI: Bobby Little is a 74 y.o. male    Summary of history :  Initially referred and seen in July 2021 for GIST tumor. 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 since then had been in remission. He was last seen by Bobby Little in Oncology back in 03/11/2020.   I performed his colonoscopy back in 2018 which was normal.  Was seen by me in July 2021 for left lower quadrant pain   Interval history   02/08/2020-03/22/2020  01/28/2020: CT scan of the abdomen and pelvis with contrast shows sigmoid diverticulosis no recurrence of tumor.  No significant relief with IBgard.  In the past he has taken dicyclomine which has worked but did not take it recently due to drug interaction.  He states that he has stopped all other drugs which can interact with dicyclomine and is willing to try it again.  Still having hard stools at times.  Current Outpatient Medications  Medication Sig Dispense  Refill   acetaminophen (TYLENOL) 500 MG tablet Take 500 mg by mouth.     atorvastatin (LIPITOR) 20 MG tablet Take 20 mg by mouth daily.     Azilsartan Medoxomil (EDARBI) 40 MG TABS Take 1 tablet by mouth daily. 90 tablet 1   clonazePAM (KLONOPIN) 1 MG tablet Take 1 mg by mouth 2 (two) times daily.     olmesartan (BENICAR) 40 MG tablet      No current facility-administered medications for this visit.    Allergies as of 03/22/2020 - Review Complete 03/10/2020  Allergen Reaction Noted   Beta adrenergic blockers  10/10/2016   Codeine Nausea And Vomiting    Trazodone and nefazodone Other (See Comments) 04/04/2017    Review of Systems:    All systems reviewed and negative except where noted in HPI.   Observations/Objective:  Labs: CMP     Component Value Date/Time   NA 140 04/11/2018 0912   K 4.7 04/11/2018 0912   CL 106 04/11/2018 0912   CO2 25 04/11/2018 0912   GLUCOSE 107 (H) 04/11/2018 0912   BUN 20 04/11/2018 0912   CREATININE 1.00 02/17/2020 1407   CALCIUM 9.0 04/11/2018 0912   GFRNONAA >60 04/11/2018 0912   GFRAA >60 04/11/2018 0912   Lab Results  Component Value Date   WBC 11.2 (H) 09/03/2017   HGB 15.9 09/03/2017   HCT 47.4 09/03/2017   MCV 90.6 09/03/2017   PLT 257 09/03/2017    Imaging Studies: No results found.  Assessment and Plan:   Bobby Little  is a 74 y.o. y/o male has a history of gastric and distal pancreatic GIST tumor S/p resection of distal pancreas, spleen and partial gastrectomy  Back in 2015.  Main complaint is discomfort in the left lower part of his abdomen.  His history is suggestive of possible pain related to scar tissue from prior surgeries.  No other red flag signs.  IBgard did not help.  Commenced on dicyclomine 10 mg 4 times daily as needed.  If it does not work suggested him to commence on MiraLAX 1 capful daily.  Follow-up telephone visit in October.     I discussed the assessment and treatment plan with the patient. The  patient was provided an opportunity to ask questions and all were answered. The patient agreed with the plan and demonstrated an understanding of the instructions.   The patient was advised to call back or seek an in-person evaluation if the symptoms worsen or if the condition fails to improve as anticipated.  I provided 12 minutes of non-face-to-face time during this encounter.  Bobby Bobby Bellows MD,MRCP [facility]) Gastroenterology/Hepatology Pager: [phone]   Speech recognition software was used to dictate this note.

## 2020-03-24 ENCOUNTER — Encounter: Payer: Self-pay | Admitting: Family Medicine

## 2020-03-24 ENCOUNTER — Other Ambulatory Visit: Payer: Self-pay

## 2020-03-24 ENCOUNTER — Ambulatory Visit (INDEPENDENT_AMBULATORY_CARE_PROVIDER_SITE_OTHER): Payer: Medicare Other | Admitting: Family Medicine

## 2020-03-24 VITALS — BP 134/86 | HR 57 | Temp 97.8°F | Wt 218.0 lb

## 2020-03-24 DIAGNOSIS — M5440 Lumbago with sciatica, unspecified side: Secondary | ICD-10-CM

## 2020-03-24 DIAGNOSIS — I10 Essential (primary) hypertension: Secondary | ICD-10-CM | POA: Diagnosis not present

## 2020-03-24 DIAGNOSIS — G8929 Other chronic pain: Secondary | ICD-10-CM

## 2020-03-24 DIAGNOSIS — F411 Generalized anxiety disorder: Secondary | ICD-10-CM

## 2020-03-24 DIAGNOSIS — R1032 Left lower quadrant pain: Secondary | ICD-10-CM

## 2020-03-24 DIAGNOSIS — G4700 Insomnia, unspecified: Secondary | ICD-10-CM

## 2020-03-24 DIAGNOSIS — C49A2 Gastrointestinal stromal tumor of stomach: Secondary | ICD-10-CM

## 2020-03-24 DIAGNOSIS — M48061 Spinal stenosis, lumbar region without neurogenic claudication: Secondary | ICD-10-CM | POA: Diagnosis not present

## 2020-03-24 DIAGNOSIS — M48062 Spinal stenosis, lumbar region with neurogenic claudication: Secondary | ICD-10-CM | POA: Insufficient documentation

## 2020-03-24 NOTE — Assessment & Plan Note (Signed)
Followed by [facility].   Pt states he has been stable on clonazepam 1mg  BID for several years.  [name] will continue to refill his medicines.

## 2020-03-24 NOTE — Patient Instructions (Signed)

## 2020-03-24 NOTE — Assessment & Plan Note (Signed)
Chronic and well controlled.  Followed by [facility].

## 2020-03-24 NOTE — Assessment & Plan Note (Signed)
Well controlled Continue current medications Reviewed metabolic panel Will repeat labs at follow up  F/u in 2 months

## 2020-03-24 NOTE — Progress Notes (Signed)
I,Laura E Walsh,acting as a scribe for [name], MD.,have documented all relevant documentation on the behalf of [name], MD,as directed by  [name], MD while in the presence of [name], MD.  New patient visit   Patient: Bobby Little   DOB: 04/30/1946   74 y.o. Male  MRN: [id number] Visit Date: 03/24/2020  Today's healthcare provider: [name], MD   Chief Complaint  Patient presents with  . Abdominal Pain  . Establish Care   Subjective    Bobby Little is a 74 y.o. male who presents today as a new patient to establish care.   Abdominal Pain This is a chronic problem. The current episode started more than 1 month ago. The pain is located in the LLQ. The abdominal pain does not radiate. Associated symptoms include constipation. Pertinent negatives include no diarrhea, fever, frequency, hematuria, nausea, vomiting or weight loss.    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  Since then has been in remission.  He is being followed by Dr. Janese Banks in Oncology most recent visit 03/11/2020.  Colonoscopy normal in 2018 by Dr. Vicente Males.    Spinal stenosis of Lumbar region Long history of chronic back pain.  Pt reports he has been to pain clinic in the past with no relief in symptoms.  Pt did not tolerate hydrocodone in the past.  (caused vomiting).  Pt is taking Diclofenac 75mg  injection.  He states he has that from [place].  He takes one injection every other day five time.  He states it has help with the pain.   Depression and Anxiety  Followed by Dr Kasandra Knudsen [facility].  Pt takes clonazepam 1mg  two time a day.  Pt states he has been stable on that dose for 20 years.   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  Will need a  Pneumovax-23 03/2022.  Pt is not sure if he has had meningitis vaccines.     Past Medical History:  Diagnosis Date  . Anxiety    x 20 y per pt  . Arthritis   . Degenerative disc disease, lumbar   . Depression   . Diverticulitis   . Family history of polyps in the colon   . Gastrointestinal stromal tumor (GIST) ([place])   . GIST (gastrointestinal stroma tumor), malignant, colon ([place])   . GIST (gastrointestinal stroma tumor), malignant, colon ([place])   . Hay fever/allergies   . Hepatic cyst   . History of spinal fusion for scoliosis   . Hypertension   . Polycystic kidney   . Prediabetes   . Spinal stenosis of lumbar region    Past Surgical History:  Procedure Laterality Date  . COLONOSCOPY WITH PROPOFOL N/A 11/22/2016   Procedure: COLONOSCOPY WITH PROPOFOL;  Surgeon: [name], MD;  Location: [facility] ENDOSCOPY;  Service: Endoscopy;  Laterality: N/A;  . KNEE ARTHROCENTESIS Right   . PANCREAS SURGERY  10/2013  . PARTIAL GASTRECTOMY    . REMOVAL OF GASTROINTESTINAL STOMATIC  TUMOR OF STOMACH  10/2013  . SPLENECTOMY  10/2013  . TUMOR REMOVAL     stomach, slpeen, and pancreas   Family Status  Relation Name Status  . Mother  Deceased  . Father  Deceased  . MGM  Deceased  . MGF  Deceased  . PGF  (Not Specified)  . Sister  Deceased  . Brother  Alive  . Neg Hx  (Not Specified)   Family History  Problem Relation Age of Onset  . Hypertension Mother   . Lymphoma Mother   . Hypertension Father   . Valvular heart disease Father   . Dementia Father   . Stroke Maternal Grandfather   . Prostate cancer Paternal Grandfather   . Coronary artery disease Sister   . Coronary artery disease Brother   . Colon cancer Neg Hx    Social History   Socioeconomic History  . Marital status: Married    Spouse name: Not on file  . Number of children: 2  . Years of education: Not on file  . Highest education level: Not on file  Occupational History  . Occupation: retired  Tobacco Use  .  Smoking status: Current Some Day Smoker    Years: 10.00    Types: Cigars  . Smokeless tobacco: Never Used  . Tobacco comment: 1 cigar 3-4 times year  Vaping Use  . Vaping Use: Never used  Substance and Sexual Activity  . Alcohol use: No  . Drug use: No  . Sexual activity: Yes    Partners: Female  Other Topics Concern  . Not on file  Social History Narrative  . Not on file   Social Determinants of Health   Financial Resource Strain:   . Difficulty of Paying Living Expenses: Not on file  Food Insecurity:   . Worried About Charity fundraiser in the Last Year: Not on file  . Ran Out of Food in the Last Year: Not on file  Transportation Needs:   . Lack of Transportation (Medical): Not on file  . Lack of Transportation (Non-Medical): Not on file  Physical Activity:   . Days of Exercise per Week: Not on file  . Minutes of Exercise per Session: Not on file  Stress:   . Feeling of Stress : Not on file  Social Connections:   . Frequency of Communication with Friends and Family: Not on file  . Frequency of Social Gatherings with Friends and Family: Not on file  . Attends Religious Services: Not on file  . Active Member of Clubs or Organizations: Not on file  . Attends Archivist Meetings: Not on file  . Marital Status: Not on file   Outpatient Medications Prior to Visit  Medication Sig  . acetaminophen (TYLENOL) 500 MG tablet Take 500 mg by mouth.  Marland Kitchen atorvastatin (LIPITOR) 40 MG tablet Take 20 mg by mouth daily.  . clonazePAM (KLONOPIN) 1 MG tablet Take 1 mg by mouth 2 (two) times daily.  . Multiple Vitamins-Minerals (PRESERVISION/LUTEIN PO) Take by mouth.  . olmesartan (BENICAR) 40 MG tablet   . [DISCONTINUED] atorvastatin (LIPITOR) 20 MG tablet Take 20 mg by mouth daily.  Marland Kitchen dicyclomine (BENTYL) 10 MG capsule Take 1 capsule (10 mg total) by mouth 4 (four) times daily -  before meals and at bedtime. (Patient not taking: Reported on 03/24/2020)  . [DISCONTINUED]  Azilsartan Medoxomil (EDARBI) 40 MG TABS Take 1 tablet by mouth daily.   No facility-administered medications prior to visit.   Allergies  Allergen Reactions  . Beta Adrenergic Blockers     Dizziness, low heart rate  . Codeine Nausea And Vomiting  . Trazodone And Nefazodone Other (See Comments)    insomnia    Immunization History  Administered Date(s) Administered  . Influenza,inj,Quad PF,6+ Mos 04/02/2017  . PFIZER SARS-COV-2 Vaccination 09/07/2019, 10/06/2019  . Pneumococcal  Conjugate-13 11/13/2013  . Pneumococcal Polysaccharide-23 04/02/2017  . Td 05/02/1999  . Tdap 05/28/2018    Health Maintenance  Topic Date Due  . INFLUENZA VACCINE  02/21/2020  . COLONOSCOPY  11/22/2021  . TETANUS/TDAP  05/28/2028  . COVID-19 Vaccine  Completed  . Hepatitis C Screening  Completed  . PNA vac Low Risk Adult  Completed    Patient Care Team: Virginia Crews, MD as PCP - General (Family Medicine)  Review of Systems  Constitutional: Positive for fatigue. Negative for activity change, appetite change, chills, diaphoresis, fever, unexpected weight change and weight loss.  Gastrointestinal: Positive for abdominal pain and constipation. Negative for abdominal distention, anal bleeding, blood in stool, diarrhea, nausea, rectal pain and vomiting.  Genitourinary: Negative for frequency and hematuria.      Objective    BP 134/86 (BP Location: Left Arm, Patient Position: Sitting, Cuff Size: Large)   Pulse (!) 57   Temp 97.8 F (36.6 C) (Oral)   Wt 218 lb (98.9 kg)   SpO2 96%   BMI 28.76 kg/m    Physical Exam Vitals reviewed.  Constitutional:      General: He is not in acute distress.    Appearance: Normal appearance. He is well-developed. He is not diaphoretic.  HENT:     Head: Normocephalic and atraumatic.  Cardiovascular:     Rate and Rhythm: Normal rate and regular rhythm.     Pulses: Normal pulses.     Heart sounds: Normal heart sounds. No murmur heard.   Pulmonary:       Effort: Pulmonary effort is normal. No respiratory distress.     Breath sounds: Normal breath sounds. No wheezing.  Abdominal:     General: Abdomen is flat. A surgical scar is present. Bowel sounds are normal. There is no distension.     Palpations: Abdomen is soft.     Tenderness: There is no abdominal tenderness. There is no guarding or rebound.  Musculoskeletal:     Cervical back: Normal range of motion and neck supple. No tenderness.     Right lower leg: No edema.     Left lower leg: No edema.  Lymphadenopathy:     Cervical: No cervical adenopathy.  Skin:    General: Skin is warm and dry.  Neurological:     Mental Status: He is alert and oriented to person, place, and time. Mental status is at baseline.  Psychiatric:        Mood and Affect: Mood normal.        Behavior: Behavior normal.    Depression Screen PHQ 2/9 Scores 03/25/2020  PHQ - 2 Score 0  PHQ- 9 Score 2   No results found for any visits on 03/24/20.  Assessment & Plan      Problem List Items Addressed This Visit      Cardiovascular and Mediastinum   Essential hypertension - Primary    Well controlled Continue current medications Reviewed metabolic panel Will repeat labs at follow up  F/u in 2 months       Relevant Medications   atorvastatin (LIPITOR) 40 MG tablet     Digestive   GIST, malignant ([place])    Followed by Dr. Janese Banks with oncology and Dr. Vicente Males with GI Stable and asymptomatic currently        Nervous and Auditory   Chronic bilateral low back pain with sciatica    Longstanding history Also a spinal stenosis Has been evaluated by spine specialist ESI not helpful Not on  any controlled substances Getting IM diclofenac from [place] which is controlling his pain Discussed risks of chronic NSAIDs        Other   GAD (generalized anxiety disorder)    Followed by [facility].   Pt states he has been stable on clonazepam 1mg  BID for several years.  [facility] will continue to refill his medicines.        Insomnia    Chronic and well controlled.  Followed by [facility].        Spinal stenosis of lumbar region    Longstanding issue Epidural steroid injections have not been helpful in the past He has been evaluated by spine specialist previously Continue to monitor Consider [facility] pain management in the future      Left lower quadrant abdominal pain    Ongoing problem for several months No change in stools or fever to suggest diverticulitis Abdominal exam is benign Reviewed Dr. Georgeann Oppenheim last note Reviewed last colonoscopy, which was normal in 2018 Advised patient that I agree with Dr. Vicente Males that he should try Bentyl for colonic spasms that may be related to his history of IBS         Total time spent on today's visit was greater than 45 minutes, including both face-to-face time and nonface-to-face time personally spent on review of chart (labs and imaging), discussing labs and goals, discussing further work-up, treatment options, referrals to specialist if needed, reviewing outside records of pertinent, answering patient's questions, and coordinating care.    Return in about 2 months (around 05/24/2020) for Follow up on Chronic issues and vaccines.Lajuana Matte, MD, have reviewed all documentation for this visit. The documentation on 03/25/20 for the exam, diagnosis, procedures, and orders are all accurate and complete.   Sumayyah Custodio, Dionne Bucy, MD, MPH [facility]

## 2020-03-25 ENCOUNTER — Ambulatory Visit: Payer: Self-pay

## 2020-03-25 DIAGNOSIS — R1032 Left lower quadrant pain: Secondary | ICD-10-CM | POA: Insufficient documentation

## 2020-03-25 NOTE — Telephone Encounter (Signed)
Advised patient as below. Patient reports that he will continue to monitor his BP and F/U with Dr. Jacinto Reap in Oct. He reports that he is feeling better already since he has been off of the Bentyl.

## 2020-03-25 NOTE — Assessment & Plan Note (Signed)
Ongoing problem for several months No change in stools or fever to suggest diverticulitis Abdominal exam is benign Reviewed Dr. Georgeann Oppenheim last note Reviewed last colonoscopy, which was normal in 2018 Advised patient that I agree with Dr. Vicente Males that he should try Bentyl for colonic spasms that may be related to his history of IBS

## 2020-03-25 NOTE — Telephone Encounter (Signed)
Patient called and says last night he was feeling a little dizzy, pressure in his head. He says he checked his BP at midnight and the machine read 185/162, which he says he didn't believe, so he checked it again and it was ranging as low as 90/48 to as high as 142/69. He says he was checking it about every 5-15 minutes. He says he did not have any other symptoms, just felt nervous about the way he was feeling. He says he didn't sleep good last night. He says that he started taking Dicyclomine yesterday and he took 2 pills in 6 hours and wonders if this is what dropped his BP and is making him feel foggy in the head. He says he read side effects and it said not to stand too fast because it could drop the BP. He says yesterday he mowed grass, washed his car under the carport and wonders if he got overheated. He says his wife has him in the house today and told him to drink plenty water. He says today his foggy head is not as bad. He says the main reason he called was to ask about the Dicyclomine if that could be making him feel that way and fluctuate his BP. I advised BP fluctuates throughout the day and asked if the only low reading was 90/48, he says that was last night and yes, the only low reading. He says right before he called it was BP150/77, P 67. He says he feels better now that he talked to someone, he says he guess he just needs to relax and not worry so much. I advised I will be sending this information to Dr. B and someone may call back with recommendation. I advised if he starts to feel that way again and his BP is up to go to the ED. He says he doesn't have CP, SOB, but he will go if he needs to.  Reason for Disposition . Dizziness caused by recent heat exposure  Answer Assessment - Initial Assessment Questions 1. DESCRIPTION: "Describe your dizziness."     Feels foggy in the head since last night 2. LIGHTHEADED: "Do you feel lightheaded?" (e.g., somewhat faint, woozy, weak upon standing)      Yes, a little faint last night 3. VERTIGO: "Do you feel like either you or the room is spinning or tilting?" (i.e. vertigo)      No 4. SEVERITY: "How bad is it?"  "Do you feel like you are going to faint?" "Can you stand and walk?"   - MILD: Feels slightly dizzy, but walking normally.   - MODERATE: Feels very unsteady when walking, but not falling; interferes with normal activities (e.g., school, work) .   - SEVERE: Unable to walk without falling, or requires assistance to walk without falling; feels like passing out now.      Mild 5. ONSET:  "When did the dizziness begin?"     Last night 6. AGGRAVATING FACTORS: "Does anything make it worse?" (e.g., standing, change in head position)     No 7. HEART RATE: "Can you tell me your heart rate?" "How many beats in 15 seconds?"  (Note: not all patients can do this)       67 a little while ago 8. CAUSE: "What do you think is causing the dizziness?"      I don't know, wondering if the dicyclomine is causing it 9. RECURRENT SYMPTOM: "Have you had dizziness before?" If Yes, ask: "When was the last time?" "  What happened that time?"     Yes many years ago 10. OTHER SYMPTOMS: "Do you have any other symptoms?" (e.g., fever, chest pain, vomiting, diarrhea, bleeding)       No 11. PREGNANCY: "Is there any chance you are pregnant?" "When was your last menstrual period?"       N/A  Protocols used: DIZZINESS [facility]

## 2020-03-25 NOTE — Telephone Encounter (Signed)
Bentyl can cause dizziness as a side effect. I do agree that the 185/162 was probably not accurate. If that was the only low reading he got and he otherwise felt fine I think he is OK to hold the bentyl for a couple of days, push fluids and see how he feels. His bp was perfect in office yesterday. He's welcome to schedule next week if he would like.

## 2020-03-25 NOTE — Assessment & Plan Note (Signed)
Longstanding issue Epidural steroid injections have not been helpful in the past He has been evaluated by spine specialist previously Continue to monitor Consider [facility] pain management in the future

## 2020-03-25 NOTE — Assessment & Plan Note (Signed)
Longstanding history Also a spinal stenosis Has been evaluated by spine specialist ESI not helpful Not on any controlled substances Getting IM diclofenac from [place] which is controlling his pain Discussed risks of chronic NSAIDs

## 2020-03-25 NOTE — Assessment & Plan Note (Signed)
Followed by Dr. Janese Banks with oncology and Dr. Vicente Males with GI Stable and asymptomatic currently

## 2020-05-20 ENCOUNTER — Ambulatory Visit (INDEPENDENT_AMBULATORY_CARE_PROVIDER_SITE_OTHER): Payer: Medicare Other | Admitting: Family Medicine

## 2020-05-20 ENCOUNTER — Encounter: Payer: Self-pay | Admitting: Family Medicine

## 2020-05-20 ENCOUNTER — Other Ambulatory Visit: Payer: Self-pay

## 2020-05-20 VITALS — BP 106/71 | HR 67 | Temp 98.6°F | Wt 212.0 lb

## 2020-05-20 DIAGNOSIS — G8929 Other chronic pain: Secondary | ICD-10-CM

## 2020-05-20 DIAGNOSIS — Q8901 Asplenia (congenital): Secondary | ICD-10-CM | POA: Diagnosis not present

## 2020-05-20 DIAGNOSIS — E1169 Type 2 diabetes mellitus with other specified complication: Secondary | ICD-10-CM

## 2020-05-20 DIAGNOSIS — R7303 Prediabetes: Secondary | ICD-10-CM

## 2020-05-20 DIAGNOSIS — M48061 Spinal stenosis, lumbar region without neurogenic claudication: Secondary | ICD-10-CM

## 2020-05-20 DIAGNOSIS — Z125 Encounter for screening for malignant neoplasm of prostate: Secondary | ICD-10-CM

## 2020-05-20 DIAGNOSIS — M5440 Lumbago with sciatica, unspecified side: Secondary | ICD-10-CM

## 2020-05-20 DIAGNOSIS — Z23 Encounter for immunization: Secondary | ICD-10-CM | POA: Diagnosis not present

## 2020-05-20 DIAGNOSIS — I10 Essential (primary) hypertension: Secondary | ICD-10-CM

## 2020-05-20 DIAGNOSIS — E663 Overweight: Secondary | ICD-10-CM | POA: Insufficient documentation

## 2020-05-20 DIAGNOSIS — E785 Hyperlipidemia, unspecified: Secondary | ICD-10-CM

## 2020-05-20 DIAGNOSIS — C49A2 Gastrointestinal stromal tumor of stomach: Secondary | ICD-10-CM

## 2020-05-20 MED ORDER — OLMESARTAN MEDOXOMIL 40 MG PO TABS: 40.0000 mg | ORAL_TABLET | Freq: Every day | ORAL | 2 refills | Status: DC

## 2020-05-20 MED ORDER — ATORVASTATIN CALCIUM 40 MG PO TABS: 20.0000 mg | ORAL_TABLET | Freq: Every day | ORAL | 2 refills | Status: DC

## 2020-05-20 NOTE — Assessment & Plan Note (Signed)
Well controlled Continue current medications Recheck metabolic panel F/u in 6 months  

## 2020-05-20 NOTE — Progress Notes (Signed)
Established patient visit   Patient: Bobby Little   DOB: 01/23/1946   74 y.o. Male  MRN: [id number] Visit Date: 05/20/2020  Today's healthcare provider: [name], MD   Chief Complaint  Patient presents with  . Abdominal Pain  . Back Pain   Subjective    Abdominal Pain This is a chronic problem. The current episode started more than 1 month ago. The problem has been unchanged. Pertinent negatives include no arthralgias, headaches or myalgias.  Back Pain This is a chronic problem. The problem is unchanged. The pain is present in the lumbar spine. Associated symptoms include abdominal pain and leg pain. Pertinent negatives include no bladder incontinence, bowel incontinence, headaches or tingling.     Constipation better with miralax Thinks pain is related to OA in hip/back Bentyl didn't agree with him  He had initial loading with meningitis vaccines and needs to repeat q5 yrs  Patient Active Problem List   Diagnosis Date Noted  . Prediabetes 05/20/2020  . Overweight 05/20/2020  . Left lower quadrant abdominal pain 03/25/2020  . Spinal stenosis of lumbar region   . Insomnia 10/30/2016  . Essential hypertension 10/10/2016  . Chronic bilateral low back pain with sciatica 10/10/2016  . GAD (generalized anxiety disorder) 10/10/2016  . GIST, malignant ([place]) 12/16/2013  . 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 Anxiety    x 20 y per pt  . Arthritis   . Degenerative disc disease, lumbar   . Depression   . Diverticulitis   . Family history of polyps in the colon   . Gastrointestinal stromal tumor (GIST) ([place])   . GIST (gastrointestinal stroma tumor), malignant, colon ([place])   . GIST (gastrointestinal stroma tumor), malignant, colon ([place])   . Hay fever/allergies   . Hepatic cyst   . History of spinal fusion for scoliosis   . Hypertension   . Polycystic kidney   .  Prediabetes   . Spinal stenosis of lumbar region    Social History   Tobacco Use  . Smoking status: Current Some Day Smoker    Years: 10.00    Types: Cigars  . Smokeless tobacco: Never Used  . Tobacco comment: 1 cigar 3-4 times year  Vaping Use  . Vaping Use: Never used  Substance Use Topics  . Alcohol use: No  . Drug use: No   Allergies  Allergen Reactions  . Beta Adrenergic Blockers     Dizziness, low heart rate  . Codeine Nausea And Vomiting  . Trazodone And Nefazodone Other (See Comments)    insomnia     Medications: Outpatient Medications Prior to Visit  Medication Sig  . acetaminophen (TYLENOL) 500 MG tablet Take 500 mg by mouth.  . clonazePAM (KLONOPIN) 1 MG tablet Take 1 mg by mouth 2 (two) times daily.  . Multiple Vitamins-Minerals (PRESERVISION/LUTEIN PO) Take by mouth.  . [DISCONTINUED] atorvastatin (LIPITOR) 40 MG tablet Take 20 mg by mouth daily.  . [DISCONTINUED] olmesartan (BENICAR) 40 MG tablet   . [DISCONTINUED] dicyclomine (BENTYL) 10 MG capsule Take 1 capsule (10 mg total) by mouth 4 (four) times daily -  before meals and at bedtime. (Patient not taking: Reported on 03/24/2020)   No facility-administered medications prior to visit.    Review of Systems  Constitutional: Negative.   Gastrointestinal: Positive for abdominal pain. Negative for bowel incontinence.  Genitourinary: Negative for bladder incontinence.  Musculoskeletal: Positive for back pain  and gait problem. Negative for arthralgias, joint swelling, myalgias, neck pain and neck stiffness.  Neurological: Negative for dizziness, tingling, light-headedness and headaches.      Objective    BP 106/71 (BP Location: Right Arm, Patient Position: Sitting, Cuff Size: Large)   Pulse 67   Temp 98.6 F (37 C) (Oral)   Wt 212 lb (96.2 kg)   BMI 27.97 kg/m    Physical Exam Vitals reviewed.  Constitutional:      General: He is not in acute distress.    Appearance: Normal appearance. He is not  diaphoretic.  HENT:     Head: Normocephalic and atraumatic.  Eyes:     General: No scleral icterus.    Conjunctiva/sclera: Conjunctivae normal.  Cardiovascular:     Rate and Rhythm: Normal rate and regular rhythm.     Pulses: Normal pulses.     Heart sounds: Normal heart sounds. No murmur heard.   Pulmonary:     Effort: Pulmonary effort is normal. No respiratory distress.     Breath sounds: Normal breath sounds. No wheezing or rhonchi.  Abdominal:     General: There is no distension.     Palpations: Abdomen is soft.     Tenderness: There is no abdominal tenderness.  Musculoskeletal:     Cervical back: Neck supple.     Right lower leg: No edema.     Left lower leg: No edema.  Lymphadenopathy:     Cervical: No cervical adenopathy.  Skin:    General: Skin is warm and dry.     Capillary Refill: Capillary refill takes less than 2 seconds.     Findings: No rash.  Neurological:     Mental Status: He is alert and oriented to person, place, and time.     Cranial Nerves: No cranial nerve deficit.  Psychiatric:        Mood and Affect: Mood normal.        Behavior: Behavior normal.       No results found for any visits on 05/20/20.  Assessment & Plan     Problem List Items Addressed This Visit      Cardiovascular and Mediastinum   Essential hypertension - Primary    Well controlled Continue current medications Recheck metabolic panel F/u in 6 months       Relevant Medications   atorvastatin (LIPITOR) 40 MG tablet   olmesartan (BENICAR) 40 MG tablet   Other Relevant Orders   Lipid panel   Comprehensive metabolic panel     Digestive   GIST, malignant ([place])    Follow with oncology and GI Continue MiraLAX for constipation        Nervous and Auditory   Chronic bilateral low back pain with sciatica    Longstanding history Also has spinal stenosis Reports he has been evaluated by spine specialist previously and ESI was not helpful Not on any controlled  substances Gets IM diclofenac from [place] which she states controls his pain decently He is interested in looking into interventional pain management, so referral was placed today Recheck metabolic panel given use of chronic NSAIDs      Relevant Orders   Ambulatory referral to Pain Clinic     Other   Spinal stenosis of lumbar region    As above, referral to interventional pain management specialist      Relevant Orders   Ambulatory referral to Pain Clinic   Prediabetes    Encourage low-carb diet Recheck A1c  Relevant Orders   Hemoglobin A1c   Overweight    Discussed importance of healthy weight management Discussed diet and exercise       Relevant Orders   Lipid panel    Other Visit Diagnoses    Screening for prostate cancer       Relevant Orders   PSA Total (Reflex To Free)   Hyperlipidemia associated with type 2 diabetes mellitus (HCC)       Relevant Medications   atorvastatin (LIPITOR) 40 MG tablet   olmesartan (BENICAR) 40 MG tablet   Need for influenza vaccination       Relevant Orders   Flu Vaccine QUAD High Dose(Fluad) (Completed)   Asplenia       Relevant Orders   Flu Vaccine QUAD High Dose(Fluad) (Completed)   Meningococcal B, OMV (Completed)   MENINGOCOCCAL MCV4O (Completed)   Need for meningococcal vaccination       Relevant Orders   Meningococcal B, OMV (Completed)   MENINGOCOCCAL MCV4O (Completed)       Return in about 6 months (around 11/18/2020) for chronic disease f/u, AWV.      I, [name], MD, have reviewed all documentation for this visit. The documentation on 05/20/20 for the exam, diagnosis, procedures, and orders are all accurate and complete.   Daisia Slomski, Dionne Bucy, MD, MPH [facility]

## 2020-05-20 NOTE — Assessment & Plan Note (Signed)
Follow with oncology and GI Continue MiraLAX for constipation

## 2020-05-20 NOTE — Assessment & Plan Note (Signed)
As above, referral to interventional pain management specialist

## 2020-05-20 NOTE — Assessment & Plan Note (Signed)
Longstanding history Also has spinal stenosis Reports he has been evaluated by spine specialist previously and ESI was not helpful Not on any controlled substances Gets IM diclofenac from [place] which she states controls his pain decently He is interested in looking into interventional pain management, so referral was placed today Recheck metabolic panel given use of chronic NSAIDs

## 2020-05-20 NOTE — Assessment & Plan Note (Signed)
Discussed importance of healthy weight management Discussed diet and exercise  

## 2020-05-20 NOTE — Assessment & Plan Note (Signed)
Encourage low carb diet Recheck A1c 

## 2020-05-21 LAB — COMPREHENSIVE METABOLIC PANEL
ALT: 23 IU/L (ref 0–44)
AST: 22 IU/L (ref 0–40)
Albumin/Globulin Ratio: 1.6 (ref 1.2–2.2)
Albumin: 4.6 g/dL (ref 3.7–4.7)
Alkaline Phosphatase: 124 IU/L — ABNORMAL HIGH (ref 44–121)
BUN/Creatinine Ratio: 16 (ref 10–24)
BUN: 16 mg/dL (ref 8–27)
Bilirubin Total: 0.7 mg/dL (ref 0.0–1.2)
CO2: 20 mmol/L (ref 20–29)
Calcium: 9.5 mg/dL (ref 8.6–10.2)
Chloride: 103 mmol/L (ref 96–106)
Creatinine, Ser: 1 mg/dL (ref 0.76–1.27)
GFR calc Af Amer: 85 mL/min/{1.73_m2} (ref >59)
GFR calc non Af Amer: 74 mL/min/{1.73_m2} (ref >59)
Globulin, Total: 2.8 g/dL (ref 1.5–4.5)
Glucose: 102 mg/dL — ABNORMAL HIGH (ref 65–99)
Potassium: 4.6 mmol/L (ref 3.5–5.2)
Sodium: 137 mmol/L (ref 134–144)
Total Protein: 7.4 g/dL (ref 6.0–8.5)

## 2020-05-21 LAB — PSA TOTAL (REFLEX TO FREE): Prostate Specific Ag, Serum: 0.5 ng/mL (ref 0.0–4.0)

## 2020-05-21 LAB — LIPID PANEL
Chol/HDL Ratio: 3.4 ratio (ref 0.0–5.0)
Cholesterol, Total: 162 mg/dL (ref 100–199)
HDL: 47 mg/dL (ref >39)
LDL Chol Calc (NIH): 94 mg/dL (ref 0–99)
Triglycerides: 116 mg/dL (ref 0–149)
VLDL Cholesterol Cal: 21 mg/dL (ref 5–40)

## 2020-05-21 LAB — HEMOGLOBIN A1C
Est. average glucose Bld gHb Est-mCnc: 128 mg/dL
Hgb A1c MFr Bld: 6.1 % — ABNORMAL HIGH (ref 4.8–5.6)

## 2020-05-23 ENCOUNTER — Other Ambulatory Visit: Payer: Self-pay | Admitting: Nephrology

## 2020-05-23 ENCOUNTER — Other Ambulatory Visit (HOSPITAL_COMMUNITY): Payer: Self-pay | Admitting: Nephrology

## 2020-05-23 ENCOUNTER — Telehealth: Payer: Self-pay

## 2020-05-23 DIAGNOSIS — N281 Cyst of kidney, acquired: Secondary | ICD-10-CM

## 2020-05-23 NOTE — Telephone Encounter (Signed)
-----   Message from Virginia Crews, MD sent at 05/23/2020 10:15 AM EDT ----- Normal/stable labs

## 2020-05-23 NOTE — Telephone Encounter (Signed)
Written by Virginia Crews, MD on 05/23/2020 10:15 AM EDT Seen by patient Bobby Little on 05/23/2020 10:16 AM

## 2020-05-25 ENCOUNTER — Encounter: Payer: Self-pay | Admitting: Family Medicine

## 2020-06-11 ENCOUNTER — Encounter: Payer: Self-pay | Admitting: Family Medicine

## 2020-07-13 ENCOUNTER — Encounter: Payer: Self-pay | Admitting: Student in an Organized Health Care Education/Training Program

## 2020-07-13 ENCOUNTER — Other Ambulatory Visit: Payer: Self-pay

## 2020-07-13 ENCOUNTER — Ambulatory Visit
Payer: Medicare Other | Attending: Student in an Organized Health Care Education/Training Program | Admitting: Student in an Organized Health Care Education/Training Program

## 2020-07-13 VITALS — BP 138/93 | HR 73 | Temp 98.1°F | Resp 18 | Ht 72.0 in | Wt 212.0 lb

## 2020-07-13 DIAGNOSIS — M5416 Radiculopathy, lumbar region: Secondary | ICD-10-CM | POA: Diagnosis present

## 2020-07-13 DIAGNOSIS — M533 Sacrococcygeal disorders, not elsewhere classified: Secondary | ICD-10-CM | POA: Diagnosis not present

## 2020-07-13 DIAGNOSIS — M25551 Pain in right hip: Secondary | ICD-10-CM | POA: Diagnosis present

## 2020-07-13 DIAGNOSIS — G8929 Other chronic pain: Secondary | ICD-10-CM | POA: Diagnosis present

## 2020-07-13 DIAGNOSIS — G894 Chronic pain syndrome: Secondary | ICD-10-CM

## 2020-07-13 DIAGNOSIS — M48062 Spinal stenosis, lumbar region with neurogenic claudication: Secondary | ICD-10-CM | POA: Diagnosis present

## 2020-07-13 DIAGNOSIS — M25552 Pain in left hip: Secondary | ICD-10-CM | POA: Diagnosis present

## 2020-07-13 MED ORDER — GABAPENTIN 300 MG PO CAPS: ORAL_CAPSULE | ORAL | 0 refills | Status: DC

## 2020-07-13 NOTE — Progress Notes (Signed)
Safety precautions to be maintained throughout the outpatient stay will include: orient to surroundings, keep bed in low position, maintain call bell within reach at all times, provide assistance with transfer out of bed and ambulation.  

## 2020-07-13 NOTE — Progress Notes (Signed)
Patient: Bobby Little  Service Category: E/M  Provider: Gillis Santa, MD  DOB: 01-Aug-1945  DOS: 07/13/2020  Referring Provider: Virginia Crews, MD  MRN: [id number]  Setting: Ambulatory outpatient  PCP: Virginia Crews, MD  Type: New Patient  Specialty: Interventional Pain Management    Location: Office  Delivery: Face-to-face     Primary Reason(s) for Visit: Encounter for initial evaluation of one or more chronic problems (new to examiner) potentially causing chronic pain, and posing a threat to normal musculoskeletal function. (Level of risk: High) CC: Back Pain (low)  HPI  Bobby Little is a 74 y.o. year old, male patient, who comes for the first time to our practice referred by Virginia Crews, MD for our initial evaluation of his chronic pain. He has Essential hypertension; Chronic bilateral low back pain with sciatica; GAD (generalized anxiety disorder); GIST, malignant ([place]); Pancreatic fistula; Primary pancreatic neuroendocrine tumor; Insomnia; Spinal stenosis, lumbar region, with neurogenic claudication; Left lower quadrant abdominal pain; Prediabetes; Overweight; Chronic radicular lumbar pain; Bilateral hip pain; Chronic SI joint pain; and Chronic pain syndrome on their problem list. Today he comes in for evaluation of his Back Pain (low)  Pain Assessment: Location: Lower Back Radiating: lower abdomen and both hips Onset: More than a month ago Duration: Chronic pain Quality: Constant,Throbbing Severity: 10-Worst pain ever/10 (subjective, self-reported pain score)  Effect on ADL:   Timing: Constant Modifying factors: rest, walking,heat, topicals BP: (!) 138/93   HR: 73  Onset and Duration: Gradual and Date of onset: 2009 Cause of pain: Unknown Severity: Getting worse, NAS-11 at its worse: 10/10, NAS-11 at its best: 8/10, NAS-11 now: 10/10 and NAS-11 on the average: 10/10 Timing: Not influenced by the time of the day, During activity or exercise and  standing Aggravating Factors: Bowel movements, Prolonged standing, Walking and Working Alleviating Factors: Sleeping Associated Problems: Constipation, Erectile dysfunction, Fatigue, Numbness and Tingling Quality of Pain: Agonizing, Horrible and Nagging Previous Examinations or Tests: CT scan, MRI scan, X-rays, Neurological evaluation and Neurosurgical evaluation Previous Treatments: Epidural steroid injections, Physical Therapy and TENS  Bobby Little is a very pleasant 74 year old male who presents with a chief complaint of low back and buttock pain that radiates into his groin as well as weakness in his legs that gets worse with walking and exertion.  He has a history of multilevel lumbar spinal stenosis with neurogenic claudication.  He has a history of chronic lumbar radicular pain as well as lumbar degenerative disc disease.  He has tried physical therapy in the past with limited benefit.  He states that when he wakes up in the morning he has to bend forward to ambulate around his house to help reduce his low back pain.  He states that he has had a lumbar epidural steroid injection in the past with limited benefit.  Of note he had a bilateral L4, L5 transforaminal epidural steroid injection with Dr. Sharlet Salina, physical medicine and rehab on 04/08/2019 with limited benefit.  He has tried diclofenac, Mobic, ibuprofen, acetaminophen in the past.  Is also tried hydrocodone which resulted in nausea.  He is not interested in opioid therapy.  He denies having tried gabapentin or Lyrica in the past.  Patient is on Klonopin for anxiety and has been on this for greater than 10 years.  Patient has a history of gastrointestinal stromal tumor that is being followed by Dr. Janese Banks with oncology.    Meds   Current Outpatient Medications:    acetaminophen (TYLENOL) 500 MG tablet, Take 500  mg by mouth., Disp: , Rfl:    atorvastatin (LIPITOR) 40 MG tablet, Take 0.5 tablets (20 mg total) by mouth daily., Disp: 90 tablet, Rfl:  2   clonazePAM (KLONOPIN) 1 MG tablet, Take 1 mg by mouth 2 (two) times daily., Disp: , Rfl:    olmesartan (BENICAR) 40 MG tablet, Take 1 tablet (40 mg total) by mouth daily., Disp: 90 tablet, Rfl: 2   gabapentin (NEURONTIN) 300 MG capsule, Take 1 capsule (300 mg total) by mouth at bedtime for 20 days, THEN 1 capsule (300 mg total) 2 (two) times daily., Disp: 100 capsule, Rfl: 0  Imaging Review   Lumbosacral Imaging: Lumbar MR wo contrast: Results for orders placed during the hospital encounter of 05/16/18  MR LUMBAR SPINE WO CONTRAST  Narrative CLINICAL DATA:  74 year old male with chronic lumbar back pain. Right leg numbness.  EXAM: MRI LUMBAR SPINE WITHOUT CONTRAST  TECHNIQUE: Multiplanar, multisequence MR imaging of the lumbar spine was performed. No intravenous contrast was administered.  COMPARISON:  CT Abdomen and Pelvis 04/11/2018.  FINDINGS: Segmentation:  Normal on the comparison.  Alignment: Mild dextroconvex lumbar spine curvature. Mild straightening of lordosis. Mild retrolisthesis of L3 on L4. Subtle retrolisthesis of L5 on S1.  Vertebrae: Chronic degenerative endplate changes P6-P9 through L5-S1, most pronounced at the former. Background bone marrow signal is normal. No marrow edema or evidence of acute osseous abnormality. Intact visible sacrum and SI joints.  Conus medullaris and cauda equina: Conus extends to the T12 level and appears normal.  Paraspinal and other soft tissues: Large right simple appearing renal cysts redemonstrated. Otherwise negative visible abdominal viscera. Negative visualized posterior paraspinal soft tissues.  Disc levels:  T12-L1:  Negative.  L1-L2:  Negative.  L2-L3: Mild far lateral disc bulging and facet hypertrophy. No stenosis.  L3-L4: Mild retrolisthesis with disc space loss. Vacuum disc here on the comparison. Left eccentric circumferential disc bulge and endplate spurring with mild to moderate facet and  ligament flavum hypertrophy which is greater on the left. Moderate to severe left L3 foraminal stenosis. Mild to moderate left lateral recess stenosis (L4 nerve level). No significant spinal stenosis. Borderline to mild right foraminal stenosis.  L4-L5: Disc space loss. Vacuum disc here on the comparison. Circumferential disc bulge and endplate spurring. Moderate facet and ligament flavum hypertrophy. Mild spinal stenosis. Moderate left and mild right lateral recess stenosis (descending L5 nerve levels). Moderate right greater than left L4 foraminal stenosis.  L5-S1: Disc space loss with vacuum disc on the comparison. Right eccentric circumferential disc bulge and endplate spurring with mild to moderate facet hypertrophy greater on the right. No spinal stenosis. Borderline to mild right lateral recess stenosis (S1 nerve level). Moderate to severe right and mild left L5 foraminal stenosis.  IMPRESSION: 1.  No acute osseous abnormality in the lumbar spine. 2. Advanced disc and endplate degeneration J0-D3 through L5-S1 with vacuum disc. Moderate superimposed posterior element hypertrophy. 3. Subsequent mild spinal stenosis at L4-L5, moderate lateral recess stenosis at the left L4 and L5 nerve levels, moderate bilateral L4 and moderate to severe right L5 neural foraminal stenosis.   Electronically Signed By: Genevie Ann M.D. On: 05/16/2018 19:04 Narrative CLINICAL DATA:  Initial evaluation for acute left hip and buttock pain since recent fall 1 week to go.  EXAM: DG HIP (WITH OR WITHOUT PELVIS) 2-3V LEFT  COMPARISON:  None.  FINDINGS: No acute fracture dislocation. Femoral heads in normal alignment within the acetabula. Femoral head heights preserved. Bony pelvis intact. Degenerative osteoarthritic changes  present about the hip. Degenerative changes also noted within the lower lumbar spine.  No appreciable soft tissue injury.  IMPRESSION: 1. No acute osseous abnormality about  the left hip. 2. Mild degenerative osteoarthritic changes about the hips bilaterally.   Electronically Signed By: Jeannine Boga M.D. On: 04/02/2017 15:48   Complexity Note: Imaging results reviewed. Results shared with Mr. Brinkley, using Layman's terms.                         ROS  Cardiovascular: No reported cardiovascular signs or symptoms such as High blood pressure, coronary artery disease, abnormal heart rate or rhythm, heart attack, blood thinner therapy or heart weakness and/or failure Pulmonary or Respiratory: No reported pulmonary signs or symptoms such as wheezing and difficulty taking a deep full breath (Asthma), difficulty blowing air out (Emphysema), coughing up mucus (Bronchitis), persistent dry cough, or temporary stoppage of breathing during sleep Neurological: No reported neurological signs or symptoms such as seizures, abnormal skin sensations, urinary and/or fecal incontinence, being born with an abnormal open spine and/or a tethered spinal cord Psychological-Psychiatric: Anxiousness Gastrointestinal: Reflux or heatburn Genitourinary: No reported renal or genitourinary signs or symptoms such as difficulty voiding or producing urine, peeing blood, non-functioning kidney, kidney stones, difficulty emptying the bladder, difficulty controlling the flow of urine, or chronic kidney disease Hematological: No reported hematological signs or symptoms such as prolonged bleeding, low or poor functioning platelets, bruising or bleeding easily, hereditary bleeding problems, low energy levels due to low hemoglobin or being anemic Endocrine: No reported endocrine signs or symptoms such as high or low blood sugar, rapid heart rate due to high thyroid levels, obesity or weight gain due to slow thyroid or thyroid disease Rheumatologic: No reported rheumatological signs and symptoms such as fatigue, joint pain, tenderness, swelling, redness, heat, stiffness, decreased range of motion,  with or without associated rash Musculoskeletal: Negative for myasthenia gravis, muscular dystrophy, multiple sclerosis or malignant hyperthermia Work History: Retired  Allergies  Mr. Lyster is allergic to beta adrenergic blockers, codeine, and trazodone and nefazodone.  Laboratory Chemistry Profile   Renal Lab Results  Component Value Date   BUN 16 05/20/2020   CREATININE 1.00 05/20/2020   BCR 16 05/20/2020   GFRAA 85 05/20/2020   GFRNONAA 74 05/20/2020     Electrolytes Lab Results  Component Value Date   NA 137 05/20/2020   K 4.6 05/20/2020   CL 103 05/20/2020   CALCIUM 9.5 05/20/2020     Hepatic Lab Results  Component Value Date   AST 22 05/20/2020   ALT 23 05/20/2020   ALBUMIN 4.6 05/20/2020   ALKPHOS 124 (H) 05/20/2020     ID No results found for: LYMEIGGIGMAB, HIV, SARSCOV2NAA, STAPHAUREUS, MRSAPCR, HCVAB, PREGTESTUR, RMSFIGG, QFVRPH1IGG, QFVRPH2IGG, LYMEIGGIGMAB   Bone No results found for: VD25OH, DD220UR4YHC, WC3762GB1, DV7616WV3, 25OHVITD1, 25OHVITD2, 25OHVITD3, TESTOFREE, TESTOSTERONE   Endocrine Lab Results  Component Value Date   GLUCOSE 102 (H) 05/20/2020   HGBA1C 6.1 (H) 05/20/2020     Neuropathy Lab Results  Component Value Date   HGBA1C 6.1 (H) 05/20/2020     CNS No results found for: COLORCSF, APPEARCSF, RBCCOUNTCSF, WBCCSF, POLYSCSF, LYMPHSCSF, EOSCSF, PROTEINCSF, GLUCCSF, JCVIRUS, CSFOLI, IGGCSF, LABACHR, ACETBL, LABACHR, ACETBL   Inflammation (CRP: Acute   ESR: Chronic) No results found for: CRP, ESRSEDRATE, LATICACIDVEN   Rheumatology No results found for: RF, ANA, LABURIC, URICUR, LYMEIGGIGMAB, LYMEABIGMQN, HLAB27   Coagulation Lab Results  Component Value Date   PLT 257  09/03/2017     Cardiovascular Lab Results  Component Value Date   HGB 15.9 09/03/2017   HCT 47.4 09/03/2017     Screening No results found for: SARSCOV2NAA, COVIDSOURCE, STAPHAUREUS, MRSAPCR, HCVAB, HIV, PREGTESTUR   Cancer No results found for: CEA,  CA125, LABCA2   Allergens No results found for: ALMOND, APPLE, ASPARAGUS, AVOCADO, BANANA, BARLEY, BASIL, BAYLEAF, GREENBEAN, LIMABEAN, WHITEBEAN, BEEFIGE, REDBEET, BLUEBERRY, BROCCOLI, CABBAGE, MELON, CARROT, CASEIN, CASHEWNUT, CAULIFLOWER, CELERY     Note: Lab results reviewed.  [place]  Drug: Mr. Janik  reports no history of drug use. Alcohol:  reports no history of alcohol use. Tobacco:  reports that he has been smoking cigars. He has smoked for the past 10.00 years. He has never used smokeless tobacco. 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 Family: family history includes Coronary artery disease in his brother and sister; Dementia in his father; Hypertension in his father and mother; Lymphoma in his mother; Prostate cancer in his paternal grandfather; Stroke in his maternal grandfather; Valvular heart disease in his father.  Past Surgical History:  Procedure Laterality Date   COLONOSCOPY WITH PROPOFOL N/A 11/22/2016   Procedure: COLONOSCOPY WITH PROPOFOL;  Surgeon: [name], MD;  Location: [facility] ENDOSCOPY;  Service: Endoscopy;  Laterality: N/A;   KNEE ARTHROCENTESIS Right    PANCREAS SURGERY  10/2013   PARTIAL GASTRECTOMY     REMOVAL OF GASTROINTESTINAL STOMATIC  TUMOR OF STOMACH  10/2013   SPLENECTOMY  10/2013   TUMOR REMOVAL     stomach, slpeen, and pancreas   Active Ambulatory Problems    Diagnosis Date Noted   Essential hypertension 10/10/2016   Chronic bilateral low back pain with sciatica 10/10/2016   GAD (generalized anxiety  disorder) 10/10/2016   GIST, malignant ([place]) 12/16/2013   Pancreatic fistula 12/16/2013   Primary pancreatic neuroendocrine tumor 12/16/2013   Insomnia 10/30/2016   Spinal stenosis, lumbar region, with neurogenic claudication    Left lower quadrant abdominal pain 03/25/2020   Prediabetes 05/20/2020   Overweight 05/20/2020   Chronic radicular lumbar pain 07/13/2020   Bilateral hip pain 07/13/2020   Chronic SI joint pain 07/13/2020   Chronic pain syndrome 07/13/2020   Resolved Ambulatory Problems    Diagnosis Date Noted   No Resolved Ambulatory Problems   Past Medical History:  Diagnosis Date   Anxiety    Arthritis    Degenerative disc disease, lumbar    Depression    Diverticulitis    Family history of polyps in the colon    Gastrointestinal stromal tumor (GIST) ([place])    GIST (gastrointestinal stroma tumor), malignant, 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 In no apparent acute distress Vitals:   07/13/20 1306  BP: (!) 138/93  Pulse: 73  Resp: 18  Temp: 98.1 F (36.7 C)  TempSrc: Temporal  SpO2: 98%  Weight: 212 lb (96.2 kg)  Height: 6' (1.829 m)   BMI Assessment: Estimated body mass index is 28.75 kg/m as calculated from the following:   Height as of this encounter: 6' (1.829 m).   Weight as of this encounter: 212 lb (96.2 kg).  BMI interpretation table: BMI level Category Range association with higher incidence of chronic pain  <18 kg/m2 Underweight   18.5-24.9 kg/m2 Ideal body weight   25-29.9 kg/m2 Overweight Increased incidence by 20%  30-34.9 kg/m2 Obese (Class I) Increased incidence by 68%  35-39.9 kg/m2 Severe obesity (Class II) Increased  incidence by 136%  >40 kg/m2 Extreme obesity (Class III) Increased incidence by 254%   Patient's current BMI Ideal Body weight  Body mass index is 28.75 kg/m. Ideal body weight: 77.6 kg (171 lb 1.2 oz) Adjusted ideal body weight: 85 kg (187 lb 7.1 oz)   BMI Readings from Last 4 Encounters:  07/13/20 28.75 kg/m  05/20/20 27.97 kg/m  03/24/20 28.76 kg/m  03/10/20 28.89 kg/m   Wt Readings from Last 4 Encounters:  07/13/20 212 lb (96.2 kg)  05/20/20 212 lb (96.2 kg)  03/24/20 218 lb (98.9 kg)  03/10/20 219 lb (99.3 kg)    Psych/Mental status: Alert, oriented x 3 (person, place, & time)       Eyes: PERLA Respiratory: No evidence of acute respiratory distress  Cervical Spine Exam  Skin & Axial Inspection: No masses, redness, edema, swelling, or associated skin lesions Alignment: Symmetrical Functional ROM: Unrestricted ROM      Stability: No instability detected Muscle Tone/Strength: Functionally intact. No obvious neuro-muscular anomalies detected. Sensory (Neurological): Unimpaired Palpation: No palpable anomalies              Upper Extremity (UE) Exam    Side: Right upper extremity  Side: Left upper extremity  Skin & Extremity Inspection: Skin color, temperature, and hair growth are WNL. No peripheral edema or cyanosis. No masses, redness, swelling, asymmetry, or associated skin lesions. No contractures.  Skin & Extremity Inspection: Skin color, temperature, and hair growth are WNL. No peripheral edema or cyanosis. No masses, redness, swelling, asymmetry, or associated skin lesions. No contractures.  Functional ROM: Unrestricted ROM          Functional ROM: Unrestricted ROM          Muscle Tone/Strength: Functionally intact. No obvious neuro-muscular anomalies detected.   Muscle Tone/Strength: Functionally intact. No obvious neuro-muscular anomalies detected.  Sensory (Neurological): Unimpaired          Sensory (Neurological): Unimpaired          Palpation: No palpable  anomalies              Palpation: No palpable anomalies              Provocative Test(s):  Phalen's test: deferred Tinel's test: deferred Apley's scratch test (touch opposite shoulder):  Action 1 (Across chest): deferred Action 2 (Overhead): deferred Action 3 (LB reach): deferred   Provocative Test(s):  Phalen's test: deferred Tinel's test: deferred Apley's scratch test (touch opposite shoulder):  Action 1 (Across chest): deferred Action 2 (Overhead): deferred Action 3 (LB reach): deferred    Thoracic Spine Area Exam  Skin & Axial Inspection: No masses, redness, or swelling Alignment: Symmetrical Functional ROM: Unrestricted ROM Stability: No instability detected Muscle Tone/Strength: Functionally intact. No obvious neuro-muscular anomalies detected. Sensory (Neurological): Unimpaired  Lumbar Exam  Skin & Axial Inspection: No masses, redness, or swelling Alignment: Symmetrical Functional ROM: Pain restricted ROM affecting both sides Stability: No instability detected Muscle Tone/Strength:  Functionally intact. No obvious neuro-muscular anomalies detected. Sensory (Neurological): Dermatomal pain pattern and neurogenic Palpation: No palpable anomalies       Provocative Tests: Hyperextension/rotation test: deferred today       Lumbar quadrant test (Kemp's test): (+) bilateral for foraminal stenosis Lateral bending test: (+) ipsilateral radicular pain, bilaterally. Positive for bilateral foraminal stenosis. Patrick's Maneuver: (+) for bilateral S-I arthralgia               Gait & Posture Assessment  Ambulation: Unassisted Gait: Relatively normal for age and body habitus Posture: Difficulty standing up straight, due to pain   Lower Extremity Exam    Side: Right lower extremity  Side: Left lower extremity  Stability: No instability observed          Stability: No instability observed          Skin & Extremity Inspection: Skin color, temperature, and hair growth are WNL. No  peripheral edema or cyanosis. No masses, redness, swelling, asymmetry, or associated skin lesions. No contractures.  Skin & Extremity Inspection: Skin color, temperature, and hair growth are WNL. No peripheral edema or cyanosis. No masses, redness, swelling, asymmetry, or associated skin lesions. No contractures.  Functional ROM: Restricted ROM for hip and knee joints          Functional ROM: Pain restricted ROM for hip and knee joints          Muscle Tone/Strength: Functionally intact. No obvious neuro-muscular anomalies detected.  Muscle Tone/Strength: Functionally intact. No obvious neuro-muscular anomalies detected.  Sensory (Neurological): Unimpaired        Sensory (Neurological): Unimpaired        DTR: Patellar: deferred today Achilles: deferred today Plantar: deferred today  DTR: Patellar: deferred today Achilles: deferred today Plantar: deferred today  Palpation: No palpable anomalies  Palpation: No palpable anomalies   Assessment  Primary Diagnosis & Pertinent Problem List: The primary encounter diagnosis was Spinal stenosis, lumbar region, with neurogenic claudication. Diagnoses of Chronic radicular lumbar pain, Bilateral hip pain, Chronic SI joint pain, and Chronic pain syndrome were also pertinent to this visit.  Visit Diagnosis (New problems to examiner): 1. Spinal stenosis, lumbar region, with neurogenic claudication   2. Chronic radicular lumbar pain   3. Bilateral hip pain   4. Chronic SI joint pain   5. Chronic pain syndrome    Plan of Care (Initial workup plan)  Note: Mr. Feger was reminded that as per protocol, today's visit has been an evaluation only. We have not taken over the patient's controlled substance management.  1. Spinal stenosis, lumbar region, with neurogenic claudication - DG Lumbar Spine Complete W/Bend; Future - Lumbar Epidural Injection; Future - gabapentin (NEURONTIN) 300 MG capsule; Take 1 capsule (300 mg total) by mouth at bedtime for 20 days,  THEN 1 capsule (300 mg total) 2 (two) times daily.  Dispense: 100 capsule; Refill: 0 -Consider repeating lumbar MRI as previous one was done October 2019  2. Chronic radicular lumbar pain - DG Lumbar Spine Complete W/Bend; Future - Lumbar Epidural Injection; Future - gabapentin (NEURONTIN) 300 MG capsule; Take 1 capsule (300 mg total) by mouth at bedtime for 20 days, THEN 1 capsule (300 mg total) 2 (two) times daily.  Dispense: 100 capsule; Refill: 0  3. Bilateral hip pain - DG HIP UNILAT W OR W/O PELVIS 2-3 VIEWS RIGHT; Future - DG HIP UNILAT W OR W/O PELVIS 2-3 VIEWS LEFT; Future  4. Chronic SI joint pain - DG Si Joints; Future  5. Chronic  pain syndrome - DG Lumbar Spine Complete W/Bend; Future - DG Si Joints; Future - DG HIP UNILAT W OR W/O PELVIS 2-3 VIEWS RIGHT; Future - DG HIP UNILAT W OR W/O PELVIS 2-3 VIEWS LEFT; Future - Lumbar Epidural Injection; Future - gabapentin (NEURONTIN) 300 MG capsule; Take 1 capsule (300 mg total) by mouth at bedtime for 20 days, THEN 1 capsule (300 mg total) 2 (two) times daily.  Dispense: 100 capsule; Refill: 0    Imaging Orders     DG Lumbar Spine Complete W/Bend     DG Si Joints     DG HIP UNILAT W OR W/O PELVIS 2-3 VIEWS RIGHT     DG HIP UNILAT W OR W/O PELVIS 2-3 VIEWS LEFT  Procedure Orders     Lumbar Epidural Injection Pharmacotherapy (current): Medications ordered:  Meds ordered this encounter  Medications   gabapentin (NEURONTIN) 300 MG capsule    Sig: Take 1 capsule (300 mg total) by mouth at bedtime for 20 days, THEN 1 capsule (300 mg total) 2 (two) times daily.    Dispense:  100 capsule    Refill:  0   Medications administered during this visit: [name] "Dan" had no medications administered during this visit.   Pharmacological management options:  Opioid Analgesics: The patient was informed that there is no guarantee that he would be a candidate for opioid analgesics. The decision will be made following CDC  guidelines. This decision will be based on the results of diagnostic studies, as well as Mr. Bostelman risk profile.   Membrane stabilizer: Trial of gabapentin as above.  Consider Lyrica in future  Muscle relaxant: To be determined at a later time  NSAID: To be determined at a later time  Other analgesic(s): To be determined at a later time   Interventional management options: Mr. Hinderer was informed that there is no guarantee that he would be a candidate for interventional therapies. The decision will be based on the results of diagnostic studies, as well as Mr. Fortner risk profile.  Procedure(s) under consideration:  Lumbar epidural steroid injection Intra-articular hip steroid injection Sacroiliac iliac joint injection Lumbar facet medial branch nerve block   Provider-requested follow-up: Return in about 2 weeks (around 07/27/2020) for L-ESI.  Future Appointments  Date Time Provider [place]  07/27/2020  8:00 AM Gillis Santa, MD [facility] None  11/17/2020  9:40 AM Brita Romp, Dionne Bucy, MD [facility]  05/18/2021 10:00 AM [facility]    Note by: Gillis Santa, MD Date: 07/13/2020; Time: 1:58 PM

## 2020-07-13 NOTE — Patient Instructions (Signed)
____________________________________________________________________________________________  General Risks and Possible Complications  Patient Responsibilities: It is important that you read this as it is part of your informed consent. It is our duty to inform you of the risks and possible complications associated with treatments offered to you. It is your responsibility as a patient to read this and to ask questions about anything that is not clear or that you believe was not covered in this document.  Patient's Rights: You have the right to refuse treatment. You also have the right to change your mind, even after initially having agreed to have the treatment done. However, under this last option, if you wait until the last second to change your mind, you may be charged for the materials used up to that point.  Introduction: Medicine is not an exact science. Everything in Medicine, including the lack of treatment(s), carries the potential for danger, harm, or loss (which is by definition: Risk). In Medicine, a complication is a secondary problem, condition, or disease that can aggravate an already existing one. All treatments carry the risk of possible complications. The fact that a side effects or complications occurs, does not imply that the treatment was conducted incorrectly. It must be clearly understood that these can happen even when everything is done following the highest safety standards.  No treatment: You can choose not to proceed with the proposed treatment alternative. The "PRO(s)" would include: avoiding the risk of complications associated with the therapy. The "CON(s)" would include: not getting any of the treatment benefits. These benefits fall under one of three categories: diagnostic; therapeutic; and/or palliative. Diagnostic benefits include: getting information which can ultimately lead to improvement of the disease or symptom(s). Therapeutic benefits are those associated with the  successful treatment of the disease. Finally, palliative benefits are those related to the decrease of the primary symptoms, without necessarily curing the condition (example: decreasing the pain from a flare-up of a chronic condition, such as incurable terminal cancer).  General Risks and Complications: These are associated to most interventional treatments. They can occur alone, or in combination. They fall under one of the following six (6) categories: no benefit or worsening of symptoms; bleeding; infection; nerve damage; allergic reactions; and/or death. 1. No benefits or worsening of symptoms: In Medicine there are no guarantees, only probabilities. No healthcare provider can ever guarantee that a medical treatment will work, they can only state the probability that it may. Furthermore, there is always the possibility that the condition may worsen, either directly, or indirectly, as a consequence of the treatment. 2. Bleeding: This is more common if the patient is taking a blood thinner, either prescription or over the counter (example: Goody Powders, Fish oil, Aspirin, Garlic, etc.), or if suffering a condition associated with impaired coagulation (example: Hemophilia, cirrhosis of the liver, low platelet counts, etc.). However, even if you do not have one on these, it can still happen. If you have any of these conditions, or take one of these drugs, make sure to notify your treating physician. 3. Infection: This is more common in patients with a compromised immune system, either due to disease (example: diabetes, cancer, human immunodeficiency virus [HIV], etc.), or due to medications or treatments (example: therapies used to treat cancer and rheumatological diseases). However, even if you do not have one on these, it can still happen. If you have any of these conditions, or take one of these drugs, make sure to notify your treating physician. 4. Nerve Damage: This is more common when the   treatment is  an invasive one, but it can also happen with the use of medications, such as those used in the treatment of cancer. The damage can occur to small secondary nerves, or to large primary ones, such as those in the spinal cord and brain. This damage may be temporary or permanent and it may lead to impairments that can range from temporary numbness to permanent paralysis and/or brain death. 5. Allergic Reactions: Any time a substance or material comes in contact with our body, there is the possibility of an allergic reaction. These can range from a mild skin rash (contact dermatitis) to a severe systemic reaction (anaphylactic reaction), which can result in death. 6. Death: In general, any medical intervention can result in death, most of the time due to an unforeseen complication. ____________________________________________________________________________________________  ____________________________________________________________________________________________  Preparing for your procedure (without sedation)  Procedure appointments are limited to planned procedures: . No Prescription Refills. . No disability issues will be discussed. . No medication changes will be discussed.  Instructions: . Oral Intake: Do not eat or drink anything for at least 6 hours prior to your procedure. (Exception: Blood Pressure Medication. See below.) . Transportation: Unless otherwise stated by your physician, you may drive yourself after the procedure. . Blood Pressure Medicine: Do not forget to take your blood pressure medicine with a sip of water the morning of the procedure. If your Diastolic (lower reading)is above 100 mmHg, elective cases will be cancelled/rescheduled. . Blood thinners: These will need to be stopped for procedures. Notify our staff if you are taking any blood thinners. Depending on which one you take, there will be specific instructions on how and when to stop it. . Diabetics on insulin: Notify  the staff so that you can be scheduled 1st case in the morning. If your diabetes requires high dose insulin, take only  of your normal insulin dose the morning of the procedure and notify the staff that you have done so. . Preventing infections: Shower with an antibacterial soap the morning of your procedure.  . Build-up your immune system: Take 1000 mg of Vitamin C with every meal (3 times a day) the day prior to your procedure. . Antibiotics: Inform the staff if you have a condition or reason that requires you to take antibiotics before dental procedures. . Pregnancy: If you are pregnant, call and cancel the procedure. . Sickness: If you have a cold, fever, or any active infections, call and cancel the procedure. . Arrival: You must be in the facility at least 30 minutes prior to your scheduled procedure. . Children: Do not bring any children with you. . Dress appropriately: Bring dark clothing that you would not mind if they get stained. . Valuables: Do not bring any jewelry or valuables.  Reasons to call and reschedule or cancel your procedure: (Following these recommendations will minimize the risk of a serious complication.) . Surgeries: Avoid having procedures within 2 weeks of any surgery. (Avoid for 2 weeks before or after any surgery). . Flu Shots: Avoid having procedures within 2 weeks of a flu shots or . (Avoid for 2 weeks before or after immunizations). . Barium: Avoid having a procedure within 7-10 days after having had a radiological study involving the use of radiological contrast. (Myelograms, Barium swallow or enema study). . Heart attacks: Avoid any elective procedures or surgeries for the initial 6 months after a "Myocardial Infarction" (Heart Attack). . Blood thinners: It is imperative that you stop these medications before procedures. Let us   know if you if you take any blood thinner.  . Infection: Avoid procedures during or within two weeks of an infection (including chest  colds or gastrointestinal problems). Symptoms associated with infections include: Localized redness, fever, chills, night sweats or profuse sweating, burning sensation when voiding, cough, congestion, stuffiness, runny nose, sore throat, diarrhea, nausea, vomiting, cold or Flu symptoms, recent or current infections. It is specially important if the infection is over the area that we intend to treat. . Heart and lung problems: Symptoms that may suggest an active cardiopulmonary problem include: cough, chest pain, breathing difficulties or shortness of breath, dizziness, ankle swelling, uncontrolled high or unusually low blood pressure, and/or palpitations. If you are experiencing any of these symptoms, cancel your procedure and contact your primary care physician for an evaluation.  Remember:  Regular Business hours are:  Monday to Thursday 8:00 AM to 4:00 PM  Provider's Schedule: Francisco Naveira, MD:  Procedure days: Tuesday and Thursday 7:30 AM to 4:00 PM  Bilal Lateef, MD:  Procedure days: Monday and Wednesday 7:30 AM to 4:00 PM ____________________________________________________________________________________________  Epidural Steroid Injection Patient Information  Description: The epidural space surrounds the nerves as they exit the spinal cord.  In some patients, the nerves can be compressed and inflamed by a bulging disc or a tight spinal canal (spinal stenosis).  By injecting steroids into the epidural space, we can bring irritated nerves into direct contact with a potentially helpful medication.  These steroids act directly on the irritated nerves and can reduce swelling and inflammation which often leads to decreased pain.  Epidural steroids may be injected anywhere along the spine and from the neck to the low back depending upon the location of your pain.   After numbing the skin with local anesthetic (like Novocaine), a small needle is passed into the epidural space slowly.  You may  experience a sensation of pressure while this is being done.  The entire block usually last less than 10 minutes.  Conditions which may be treated by epidural steroids:   Low back and leg pain  Neck and arm pain  Spinal stenosis  Post-laminectomy syndrome  Herpes zoster (shingles) pain  Pain from compression fractures  Preparation for the injection:  1. Do not eat any solid food or dairy products within 8 hours of your appointment.  2. You may drink clear liquids up to 3 hours before appointment.  Clear liquids include water, black coffee, juice or soda.  No milk or cream please. 3. You may take your regular medication, including pain medications, with a sip of water before your appointment  Diabetics should hold regular insulin (if taken separately) and take 1/2 normal NPH dos the morning of the procedure.  Carry some sugar containing items with you to your appointment. 4. A driver must accompany you and be prepared to drive you home after your procedure.  5. Bring all your current medications with your. 6. An IV may be inserted and sedation may be given at the discretion of the physician.   7. A blood pressure cuff, EKG and other monitors will often be applied during the procedure.  Some patients may need to have extra oxygen administered for a short period. 8. You will be asked to provide medical information, including your allergies, prior to the procedure.  We must know immediately if you are taking blood thinners (like Coumadin/Warfarin)  Or if you are allergic to IV iodine contrast (dye). We must know if you could possible be pregnant.    Possible side-effects:  Bleeding from needle site  Infection (rare, may require surgery)  Nerve injury (rare)  Numbness & tingling (temporary)  Difficulty urinating (rare, temporary)  Spinal headache ( a headache worse with upright posture)  Light -headedness (temporary)  Pain at injection site (several days)  Decreased blood  pressure (temporary)  Weakness in arm/leg (temporary)  Pressure sensation in back/neck (temporary)  Call if you experience:  Fever/chills associated with headache or increased back/neck pain.  Headache worsened by an upright position.  New onset weakness or numbness of an extremity below the injection site  Hives or difficulty breathing (go to the emergency room)  Inflammation or drainage at the infection site  Severe back/neck pain  Any new symptoms which are concerning to you  Please note:  Although the local anesthetic injected can often make your back or neck feel good for several hours after the injection, the pain will likely return.  It takes 3-7 days for steroids to work in the epidural space.  You may not notice any pain relief for at least that one week.  If effective, we will often do a series of three injections spaced 3-6 weeks apart to maximally decrease your pain.  After the initial series, we generally will wait several months before considering a repeat injection of the same type.  If you have any questions, please call [phone] [facility] 

## 2020-07-18 ENCOUNTER — Ambulatory Visit: Payer: Self-pay

## 2020-07-18 NOTE — Telephone Encounter (Signed)
Pt. Reports he is concerned he may have been exposed to COVID 19 at an UC with his wife, 3days ago. Does not feel "well." Has dizziness and "my BP is going up and down- 137/77 today, but it can go as high as 195/100 x 3 days." Dry cough. No fever. No available virtual visits today. Pt. Wants advise from Dr. Beryle Flock. Please advise pt. Practice currently closed for lunch.  Answer Assessment - Initial Assessment Questions 1. BLOOD PRESSURE: "What is the blood pressure?" "Did you take at least two measurements 5 minutes apart?"     137/77 today, but it has been elevated 2. ONSET: "When did you take your blood pressure?"     3 days ago 3. HOW: "How did you obtain the blood pressure?" (e.g., visiting nurse, automatic home BP monitor)     Home monitor 4. HISTORY: "Do you have a history of high blood pressure?"     Yes 5. MEDICATIONS: "Are you taking any medications for blood pressure?" "Have you missed any doses recently?"     No missed doses 6. OTHER SYMPTOMS: "Do you have any symptoms?" (e.g., headache, chest pain, blurred vision, difficulty breathing, weakness)     Dizzy 7. PREGNANCY: "Is there any chance you are pregnant?" "When was your last menstrual period?"     n/a  Protocols used: BLOOD PRESSURE - HIGH-A-AH

## 2020-07-18 NOTE — Telephone Encounter (Signed)
Called patient to schedule a virtual visit, and he declines. He reports that he will continue to monitor symptoms and call if they worsen. He reports that this is the 3rd day and symptoms have slightly improved. Advised that he will need a virtual visit if he wanted to be tested for covid. Patient does not feel that he has covid. He believes that it is viral. He reports that his BP does go up and down when he doesn't feel well. Advised patient if he develops shortness of breath, cough, congestion, fevers, chills, nausea, or vomiting that he should call or go to the ER for evaluation. Patient verbally understands.

## 2020-07-27 ENCOUNTER — Ambulatory Visit: Payer: PRIVATE HEALTH INSURANCE | Admitting: Student in an Organized Health Care Education/Training Program

## 2020-09-05 ENCOUNTER — Ambulatory Visit
Admission: RE | Admit: 2020-09-05 | Discharge: 2020-09-05 | Disposition: A | Discharge status: Home / Self Care | Payer: Medicare Other | Source: Ambulatory Visit | Attending: Student in an Organized Health Care Education/Training Program | Admitting: Student in an Organized Health Care Education/Training Program

## 2020-09-05 ENCOUNTER — Other Ambulatory Visit: Payer: Self-pay

## 2020-09-05 DIAGNOSIS — G8929 Other chronic pain: Secondary | ICD-10-CM | POA: Diagnosis present

## 2020-09-05 DIAGNOSIS — M48062 Spinal stenosis, lumbar region with neurogenic claudication: Secondary | ICD-10-CM

## 2020-09-05 DIAGNOSIS — G894 Chronic pain syndrome: Secondary | ICD-10-CM | POA: Diagnosis present

## 2020-09-05 DIAGNOSIS — M5416 Radiculopathy, lumbar region: Secondary | ICD-10-CM

## 2020-09-05 DIAGNOSIS — M25551 Pain in right hip: Secondary | ICD-10-CM

## 2020-09-05 DIAGNOSIS — M533 Sacrococcygeal disorders, not elsewhere classified: Secondary | ICD-10-CM | POA: Insufficient documentation

## 2020-09-05 DIAGNOSIS — M25552 Pain in left hip: Secondary | ICD-10-CM | POA: Insufficient documentation

## 2020-09-07 ENCOUNTER — Ambulatory Visit (HOSPITAL_BASED_OUTPATIENT_CLINIC_OR_DEPARTMENT_OTHER): Payer: Medicare Other | Admitting: Student in an Organized Health Care Education/Training Program

## 2020-09-07 ENCOUNTER — Encounter: Payer: Self-pay | Admitting: Student in an Organized Health Care Education/Training Program

## 2020-09-07 ENCOUNTER — Ambulatory Visit
Admission: RE | Admit: 2020-09-07 | Discharge: 2020-09-07 | Disposition: A | Discharge status: Home / Self Care | Payer: Medicare Other | Source: Ambulatory Visit | Attending: Student in an Organized Health Care Education/Training Program | Admitting: Student in an Organized Health Care Education/Training Program

## 2020-09-07 ENCOUNTER — Other Ambulatory Visit: Payer: Self-pay

## 2020-09-07 VITALS — BP 133/68 | HR 78 | Temp 97.9°F | Resp 16 | Ht 72.0 in | Wt 212.0 lb

## 2020-09-07 DIAGNOSIS — M25552 Pain in left hip: Secondary | ICD-10-CM | POA: Insufficient documentation

## 2020-09-07 DIAGNOSIS — M48062 Spinal stenosis, lumbar region with neurogenic claudication: Secondary | ICD-10-CM | POA: Insufficient documentation

## 2020-09-07 DIAGNOSIS — M25551 Pain in right hip: Secondary | ICD-10-CM | POA: Insufficient documentation

## 2020-09-07 DIAGNOSIS — M5416 Radiculopathy, lumbar region: Secondary | ICD-10-CM | POA: Insufficient documentation

## 2020-09-07 DIAGNOSIS — G8929 Other chronic pain: Secondary | ICD-10-CM | POA: Diagnosis present

## 2020-09-07 MED ORDER — IOHEXOL 180 MG/ML  SOLN
10.0000 mL | Freq: Once | INTRAMUSCULAR | Status: AC
  Administered 2020-09-07: 10 mL via EPIDURAL

## 2020-09-07 MED ORDER — TRIAMCINOLONE ACETONIDE 40 MG/ML IJ SUSP
40.0000 mg | Freq: Once | INTRAMUSCULAR | Status: AC
  Administered 2020-09-07: 40 mg
  Filled 2020-09-07: qty 1

## 2020-09-07 MED ORDER — ROPIVACAINE HCL 2 MG/ML IJ SOLN
2.0000 mL | Freq: Once | INTRAMUSCULAR | Status: AC
  Administered 2020-09-07: 2 mL via EPIDURAL
  Filled 2020-09-07: qty 10

## 2020-09-07 MED ORDER — SODIUM CHLORIDE 0.9% FLUSH
2.0000 mL | Freq: Once | INTRAVENOUS | Status: AC
  Administered 2020-09-07: 2 mL

## 2020-09-07 NOTE — Patient Instructions (Signed)
Pain Management Discharge Instructions  General Discharge Instructions :  If you need to reach your doctor call: Monday-Friday 8:00 am - 4:00 pm at [phone] or toll free [phone].  After clinic hours [phone] to have operator reach doctor.  Bring all of your medication bottles to all your appointments in the pain clinic.  To cancel or reschedule your appointment with Pain Management please remember to call 24 hours in advance to avoid a fee.  Refer to the educational materials which you have been given on: General Risks, I had my Procedure. Discharge Instructions, Post Sedation.  Post Procedure Instructions:  The drugs you were given will stay in your system until tomorrow, so for the next 24 hours you should not drive, make any legal decisions or drink any alcoholic beverages.  You may eat anything you prefer, but it is better to start with liquids then soups and crackers, and gradually work up to solid foods.  Please notify your doctor immediately if you have any unusual bleeding, trouble breathing or pain that is not related to your normal pain.  Depending on the type of procedure that was done, some parts of your body may feel week and/or numb.  This usually clears up by tonight or the next day.  Walk with the use of an assistive device or accompanied by an adult for the 24 hours.  You may use ice on the affected area for the first 24 hours.  Put ice in a Ziploc bag and cover with a towel and place against area 15 minutes on 15 minutes off.  You may switch to heat after 24 hours.Epidural Steroid Injection Patient Information  Description: The epidural space surrounds the nerves as they exit the spinal cord.  In some patients, the nerves can be compressed and inflamed by a bulging disc or a tight spinal canal (spinal stenosis).  By injecting steroids into the epidural space, we can bring irritated nerves into direct contact with a potentially helpful medication.  These  steroids act directly on the irritated nerves and can reduce swelling and inflammation which often leads to decreased pain.  Epidural steroids may be injected anywhere along the spine and from the neck to the low back depending upon the location of your pain.   After numbing the skin with local anesthetic (like Novocaine), a small needle is passed into the epidural space slowly.  You may experience a sensation of pressure while this is being done.  The entire block usually last less than 10 minutes.  Conditions which may be treated by epidural steroids:   Low back and leg pain  Neck and arm pain  Spinal stenosis  Post-laminectomy syndrome  Herpes zoster (shingles) pain  Pain from compression fractures  Preparation for the injection:  1. Do not eat any solid food or dairy products within 8 hours of your appointment.  2. You may drink clear liquids up to 3 hours before appointment.  Clear liquids include water, black coffee, juice or soda.  No milk or cream please. 3. You may take your regular medication, including pain medications, with a sip of water before your appointment  Diabetics should hold regular insulin (if taken separately) and take 1/2 normal NPH dos the morning of the procedure.  Carry some sugar containing items with you to your appointment. 4. A driver must accompany you and be prepared to drive you home after your procedure.  5. Bring all your current medications with your. 6. An IV may be inserted and   sedation may be given at the discretion of the physician.   7. A blood pressure cuff, EKG and other monitors will often be applied during the procedure.  Some patients may need to have extra oxygen administered for a short period. 8. You will be asked to provide medical information, including your allergies, prior to the procedure.  We must know immediately if you are taking blood thinners (like Coumadin/Warfarin)  Or if you are allergic to IV iodine contrast (dye). We must  know if you could possible be pregnant.  Possible side-effects:  Bleeding from needle site  Infection (rare, may require surgery)  Nerve injury (rare)  Numbness & tingling (temporary)  Difficulty urinating (rare, temporary)  Spinal headache ( a headache worse with upright posture)  Light -headedness (temporary)  Pain at injection site (several days)  Decreased blood pressure (temporary)  Weakness in arm/leg (temporary)  Pressure sensation in back/neck (temporary)  Call if you experience:  Fever/chills associated with headache or increased back/neck pain.  Headache worsened by an upright position.  New onset weakness or numbness of an extremity below the injection site  Hives or difficulty breathing (go to the emergency room)  Inflammation or drainage at the infection site  Severe back/neck pain  Any new symptoms which are concerning to you  Please note:  Although the local anesthetic injected can often make your back or neck feel good for several hours after the injection, the pain will likely return.  It takes 3-7 days for steroids to work in the epidural space.  You may not notice any pain relief for at least that one week.  If effective, we will often do a series of three injections spaced 3-6 weeks apart to maximally decrease your pain.  After the initial series, we generally will wait several months before considering a repeat injection of the same type.  If you have any questions, please call [phone] [facility] 

## 2020-09-07 NOTE — Progress Notes (Signed)
PROVIDER NOTE: Information contained herein reflects review and annotations entered in association with encounter. Interpretation of such information and data should be left to medically-trained personnel. Information provided to patient can be located elsewhere in the medical record under "Patient Instructions". Document created using STT-dictation technology, any transcriptional errors that may result from process are unintentional.    Patient: Bobby Little  Service Category: Procedure  Provider: Gillis Santa, MD  DOB: 06-25-1946  DOS: 09/07/2020  Location: [facility]  MRN: [id number]  Setting: Ambulatory - outpatient  Referring Provider: Virginia Crews, MD  Type: Established Patient  Specialty: Interventional Pain Management  PCP: Virginia Crews, MD   Primary Reason for Visit: Interventional Pain Management Treatment. CC: Back Pain (lower) and Hip Pain (left)  Procedure:          Anesthesia, Analgesia, Anxiolysis:  Type: Diagnostic Inter-Laminar Epidural Steroid Injection  #1  Region: Lumbar Level: L4-5 Level. Laterality: Midline         Type: Local Anesthesia  Local Anesthetic: Lidocaine 1-2%  Position: Prone with head of the table was raised to facilitate breathing.   Indications: 1. Spinal stenosis, lumbar region, with neurogenic claudication   2. Chronic radicular lumbar pain   3. Bilateral hip pain    Pain Score: Pre-procedure: 6 /10 Post-procedure: 6 /10   Pre-op H&P Assessment:  Bobby Little is a 75 y.o. (year old), male patient, seen today for interventional treatment. 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 Bobby Little has a current medication list which includes the following prescription(s): acetaminophen,  atorvastatin, clonazepam, olmesartan, and gabapentin. His primarily concern today is the Back Pain (lower) and Hip Pain (left)  Initial Vital Signs:  Pulse/HCG Rate: 78ECG Heart Rate: 69 Temp: 97.9 F (36.6 C) Resp: 16 BP: 132/90 SpO2: 100 %  BMI: Estimated body mass index is 28.75 kg/m as calculated from the following:   Height as of this encounter: 6' (1.829 m).   Weight as of this encounter: 212 lb (96.2 kg).  Risk Assessment: Allergies: Reviewed. He is allergic to beta adrenergic blockers, codeine, and trazodone and nefazodone.  Allergy Precautions: None required Coagulopathies: Reviewed. None identified.  Blood-thinner therapy: None at this time Active Infection(s): Reviewed. None identified. Bobby Little is afebrile  Site Confirmation: Bobby Little was asked to confirm the procedure and laterality before marking the site Procedure checklist: Completed Consent: Before the procedure and under the influence of no sedative(s), amnesic(s), or anxiolytics, the patient was informed of the treatment options, risks and possible complications. To fulfill our ethical and legal obligations, as recommended by the American Medical Association's Code of Ethics, I have informed the patient of my clinical impression; the nature and purpose of the treatment or procedure; the risks, benefits, and possible complications of the intervention; the alternatives, including doing nothing; the risk(s) and benefit(s) of the alternative treatment(s) or procedure(s); and the risk(s) and benefit(s) of doing nothing. The patient was provided information about the general risks and possible complications associated with the procedure. These may include, but are not limited to: failure to achieve desired goals, infection, bleeding, organ or nerve damage, allergic reactions, paralysis, and death. In addition, the patient was informed of those risks and complications associated to Spine-related procedures, such as failure  to decrease pain; infection (i.e.: Meningitis, epidural or intraspinal abscess); bleeding (i.e.: epidural  hematoma, subarachnoid hemorrhage, or any other type of intraspinal or peri-dural bleeding); organ or nerve damage (i.e.: Any type of peripheral nerve, nerve root, or spinal cord injury) with subsequent damage to sensory, motor, and/or autonomic systems, resulting in permanent pain, numbness, and/or weakness of one or several areas of the body; allergic reactions; (i.e.: anaphylactic reaction); and/or death. Furthermore, the patient was informed of those risks and complications associated with the medications. These include, but are not limited to: allergic reactions (i.e.: anaphylactic or anaphylactoid reaction(s)); adrenal axis suppression; blood sugar elevation that in diabetics may result in ketoacidosis or comma; water retention that in patients with history of congestive heart failure may result in shortness of breath, pulmonary edema, and decompensation with resultant heart failure; weight gain; swelling or edema; medication-induced neural toxicity; particulate matter embolism and blood vessel occlusion with resultant organ, and/or nervous system infarction; and/or aseptic necrosis of one or more joints. Finally, the patient was informed that Medicine is not an exact science; therefore, there is also the possibility of unforeseen or unpredictable risks and/or possible complications that may result in a catastrophic outcome. The patient indicated having understood very clearly. We have given the patient no guarantees and we have made no promises. Enough time was given to the patient to ask questions, all of which were answered to the patient's satisfaction. Bobby Little has indicated that he wanted to continue with the procedure. Attestation: I, the ordering provider, attest that I have discussed with the patient the benefits, risks, side-effects, alternatives, likelihood of achieving goals, and  potential problems during recovery for the procedure that I have provided informed consent. Date  Time: 09/07/2020  8:53 AM  Pre-Procedure Preparation:  Monitoring: As per clinic protocol. Respiration, ETCO2, SpO2, BP, heart rate and rhythm monitor placed and checked for adequate function Safety Precautions: Patient was assessed for positional comfort and pressure points before starting the procedure. Time-out: I initiated and conducted the "Time-out" before starting the procedure, as per protocol. The patient was asked to participate by confirming the accuracy of the "Time Out" information. Verification of the correct person, site, and procedure were performed and confirmed by me, the nursing staff, and the patient. "Time-out" conducted as per Joint Commission's Universal Protocol (UP.01.01.01). Time: 0940  Description of Procedure:          Target Area: The interlaminar space, initially targeting the lower laminar border of the superior vertebral body. Approach: Paramedial approach. Area Prepped: Entire Posterior Lumbar Region DuraPrep (Iodine Povacrylex [0.7% available iodine] and Isopropyl Alcohol, 74% w/w) Safety Precautions: Aspiration looking for blood return was conducted prior to all injections. At no point did we inject any substances, as a needle was being advanced. No attempts were made at seeking any paresthesias. Safe injection practices and needle disposal techniques used. Medications properly checked for expiration dates. SDV (single dose vial) medications used. Description of the Procedure: Protocol guidelines were followed. The procedure needle was introduced through the skin, ipsilateral to the reported pain, and advanced to the target area. Bone was contacted and the needle walked caudad, until the lamina was cleared. The epidural space was identified using "loss-of-resistance technique" with 2-3 ml of PF-NaCl (0.9% NSS), in a 5cc LOR glass syringe.  Vitals:   09/07/20 0857  09/07/20 0939 09/07/20 0944  BP: 132/90 135/72 133/68  Pulse: 78    Resp: 16 18 16   Temp: 97.9 F (36.6 C)    TempSrc: Temporal    SpO2: 100% 99% 99%  Weight: 212 lb (96.2 kg)  Height: 6' (1.829 m)      Start Time: 0940 hrs. End Time: 0944 hrs.  Materials:  Needle(s) Type: Epidural needle Gauge: 22G Length: 3.5-in Medication(s): Please see orders for medications and dosing details. 5 cc solution made of 3 cc of preservative-free saline, 2 cc of 0.2% ropivacaine, 1 cc of Decadron 10 mg/cc. Imaging Guidance (Spinal):          Type of Imaging Technique: Fluoroscopy Guidance (Spinal) Indication(s): Assistance in needle guidance and placement for procedures requiring needle placement in or near specific anatomical locations not easily accessible without such assistance. Exposure Time: Please see nurses notes. Contrast: Before injecting any contrast, we confirmed that the patient did not have an allergy to iodine, shellfish, or radiological contrast. Once satisfactory needle placement was completed at the desired level, radiological contrast was injected. Contrast injected under live fluoroscopy. No contrast complications. See chart for type and volume of contrast used. Fluoroscopic Guidance: I was personally present during the use of fluoroscopy. "Tunnel Vision Technique" used to obtain the best possible view of the target area. Parallax error corrected before commencing the procedure. "Direction-depth-direction" technique used to introduce the needle under continuous pulsed fluoroscopy. Once target was reached, antero-posterior, oblique, and lateral fluoroscopic projection used confirm needle placement in all planes. Images permanently stored in EMR. Interpretation: I personally interpreted the imaging intraoperatively. Adequate needle placement confirmed in multiple planes. Appropriate spread of contrast into desired area was observed. No evidence of afferent or efferent intravascular  uptake. No intrathecal or subarachnoid spread observed. Permanent images saved into the patient's record.  Post-operative Assessment:  Post-procedure Vital Signs:  Pulse/HCG Rate: 7873 Temp: 97.9 F (36.6 C) Resp: 16 BP: 133/68 SpO2: 99 %  EBL: None  Complications: No immediate post-treatment complications observed by team, or reported by patient.  Note: The patient tolerated the entire procedure well. A repeat set of vitals were taken after the procedure and the patient was kept under observation following institutional policy, for this type of procedure. Post-procedural neurological assessment was performed, showing return to baseline, prior to discharge. The patient was provided with post-procedure discharge instructions, including a section on how to identify potential problems. Should any problems arise concerning this procedure, the patient was given instructions to immediately contact us, at any time, without hesitation. In any case, we plan to contact the patient by telephone for a follow-up status report regarding this interventional procedure.  Comments:  No additional relevant information.  Plan of Care  Orders:  Orders Placed This Encounter  Procedures  . DG PAIN CLINIC C-ARM 1-60 MIN NO REPORT    Intraoperative interpretation by procedural physician at [facility].    Standing Status:   Standing    Number of Occurrences:   1    Order Specific Question:   Reason for exam:    Answer:   Assistance in needle guidance and placement for procedures requiring needle placement in or near specific anatomical locations not easily accessible without such assistance.    Medications ordered for procedure: Meds ordered this encounter  Medications  . iohexol (OMNIPAQUE) 180 MG/ML injection 10 mL    Must be Myelogram-compatible. If not available, you may substitute with a water-soluble, non-ionic, hypoallergenic, myelogram-compatible radiological contrast medium.  .  ropivacaine (PF) 2 mg/mL (0.2%) (NAROPIN) injection 2 mL  . sodium chloride flush (NS) 0.9 % injection 2 mL  . triamcinolone acetonide (KENALOG-40) injection 40 mg   Medications administered: We administered iohexol, ropivacaine (PF) 2 mg/mL (0.2%), sodium chloride flush,  and triamcinolone acetonide.  See the medical record for exact dosing, route, and time of administration.  Follow-up plan:   Return in about 4 weeks (around 10/05/2020) for Post Procedure Evaluation, virtual.      IL L4/5 ESI #1 09/07/20    Recent Visits Date Type Provider Dept  07/13/20 Office Visit Gillis Santa, MD [facility]  Showing recent visits within past 90 days and meeting all other requirements Today's Visits Date Type Provider Dept  09/07/20 Procedure visit Gillis Santa, MD [facility]  Showing today's visits and meeting all other requirements Future Appointments Date Type Provider Dept  10/03/20 Appointment Gillis Santa, MD [facility]  Showing future appointments within next 90 days and meeting all other requirements  Disposition: Discharge home  Discharge (Date  Time): 09/07/2020; 0955 hrs.   Primary Care Physician: Virginia Crews, MD Location: [facility] Note by: Gillis Santa, MD Date: 09/07/2020; Time: 11:03 AM  Disclaimer:  Medicine is not an exact science. The only guarantee in medicine is that nothing is guaranteed. It is important to note that the decision to proceed with this intervention was based on the information collected from the patient. The Data and conclusions were drawn from the patient's questionnaire, the interview, and the physical examination. Because the information was provided in large part by the patient, it cannot be guaranteed that it has not been purposely or unconsciously manipulated. Every effort has been made to obtain as much relevant data as possible for this evaluation. It is important to note that the  conclusions that lead to this procedure are derived in large part from the available data. Always take into account that the treatment will also be dependent on availability of resources and existing treatment guidelines, considered by other Pain Management Practitioners as being common knowledge and practice, at the time of the intervention. For Medico-Legal purposes, it is also important to point out that variation in procedural techniques and pharmacological choices are the acceptable norm. The indications, contraindications, technique, and results of the above procedure should only be interpreted and judged by a Board-Certified Interventional Pain Specialist with extensive familiarity and expertise in the same exact procedure and technique.

## 2020-09-07 NOTE — Progress Notes (Signed)
Safety precautions to be maintained throughout the outpatient stay will include: orient to surroundings, keep bed in low position, maintain call bell within reach at all times, provide assistance with transfer out of bed and ambulation.  

## 2020-09-08 ENCOUNTER — Telehealth: Payer: Self-pay

## 2020-09-08 ENCOUNTER — Telehealth: Payer: Self-pay | Admitting: *Deleted

## 2020-09-08 NOTE — Telephone Encounter (Signed)
Error

## 2020-09-08 NOTE — Telephone Encounter (Signed)
Attempted to call for post procedure follow-up. Message left. 

## 2020-09-15 ENCOUNTER — Encounter: Payer: Self-pay | Admitting: Student in an Organized Health Care Education/Training Program

## 2020-09-28 ENCOUNTER — Telehealth: Payer: Self-pay | Admitting: Family Medicine

## 2020-09-28 NOTE — Telephone Encounter (Addendum)
Pt seen dr Holley Raring and received steroid injection in his back and the injection is not working. Pt would like to know if dr b would prescribed generic mobic 15 mg. Pt has been taking generic mobic 7.5 mg. Pt states he will not double up unless dr b tells him its ok.. Pt states this medications help with his pain around 40 percent. [facility]. The mobic was prescribed by another provider.

## 2020-09-29 ENCOUNTER — Encounter: Payer: Self-pay | Admitting: Student in an Organized Health Care Education/Training Program

## 2020-09-29 MED ORDER — MELOXICAM 15 MG PO TABS: 15.0000 mg | ORAL_TABLET | Freq: Every day | ORAL | 1 refills | Status: DC

## 2020-09-29 NOTE — Telephone Encounter (Signed)
Ok to send in mobic 15mg  daily #90 r1. Thanks.

## 2020-10-03 ENCOUNTER — Other Ambulatory Visit: Payer: Self-pay

## 2020-10-03 ENCOUNTER — Encounter: Payer: Self-pay | Admitting: Student in an Organized Health Care Education/Training Program

## 2020-10-03 ENCOUNTER — Ambulatory Visit
Payer: Medicare Other | Attending: Student in an Organized Health Care Education/Training Program | Admitting: Student in an Organized Health Care Education/Training Program

## 2020-10-03 DIAGNOSIS — M48062 Spinal stenosis, lumbar region with neurogenic claudication: Secondary | ICD-10-CM

## 2020-10-03 DIAGNOSIS — G8929 Other chronic pain: Secondary | ICD-10-CM | POA: Diagnosis not present

## 2020-10-03 DIAGNOSIS — G894 Chronic pain syndrome: Secondary | ICD-10-CM

## 2020-10-03 DIAGNOSIS — M5416 Radiculopathy, lumbar region: Secondary | ICD-10-CM

## 2020-10-03 NOTE — Progress Notes (Signed)
Patient: Bobby Little  Service Category: E/M  Provider: Bilal Lateef, MD  DOB: 12/06/1945  DOS: 10/03/2020  Location: Office  MRN: [id number]  Setting: Ambulatory outpatient  Referring Provider: Bacigalupo, Angela M, MD  Type: Established Patient  Specialty: Interventional Pain Management  PCP: Bacigalupo, Angela M, MD  Location: Home  Delivery: TeleHealth     Virtual Encounter - Pain Management PROVIDER NOTE: Information contained herein reflects review and annotations entered in association with encounter. Interpretation of such information and data should be left to medically-trained personnel. Information provided to patient can be located elsewhere in the medical record under "Patient Instructions". Document created using STT-dictation technology, any transcriptional errors that may result from process are unintentional.    Contact & Pharmacy Preferred: [phone] Home: [phone] (home) Mobile: [phone] (mobile) E-mail: [email or web address]  [facility] - [place] - [address] [place] [address] Phone: [phone] Fax: [phone]   Pre-screening  Bobby Little offered "in-person" vs "virtual" encounter. He indicated preferring virtual for this encounter.   Reason COVID-19*  Social distancing based on CDC and AMA recommendations.   I contacted Bobby Little on 10/03/2020 via video conference.      I clearly identified myself as Bilal Lateef, MD. I verified that I was speaking with the correct person using two identifiers (Name: Bobby Little, and date of birth: 11/19/1945).  Consent I sought verbal advanced consent from Bobby Little for virtual visit interactions. I informed Bobby Little of possible security and privacy concerns, risks, and limitations associated with providing "not-in-person" medical evaluation and management services. I also informed Bobby Little of the availability of "in-person" appointments. Finally, I  informed him that there would be a charge for the virtual visit and that he could be  personally, fully or partially, financially responsible for it. Bobby Little expressed understanding and agreed to proceed.   Historic Elements   Bobby Little is a 75 y.o. year old, male patient evaluated today after our last contact on 09/07/2020. Bobby Little  has a past medical history of Anxiety, Arthritis, Degenerative disc disease, lumbar, Depression, Diverticulitis, Family history of polyps in the colon, Gastrointestinal stromal tumor (GIST) (HCC), GIST (gastrointestinal stroma tumor), malignant, colon (HCC), GIST (gastrointestinal stroma tumor), malignant, colon (HCC), Hay fever/allergies, Hepatic cyst, History of spinal fusion for scoliosis, Hypertension, Polycystic kidney, Prediabetes, and Spinal stenosis of lumbar region. 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 Bobby Little has a current medication list which includes the following prescription(s): acetaminophen, atorvastatin, clonazepam, meloxicam, and olmesartan. He  reports that he has been smoking cigars. He has smoked for the past 10.00 years. He has never used smokeless tobacco. He reports that he does not drink alcohol and does not use drugs. Bobby Little is allergic to beta adrenergic blockers, codeine, and trazodone and nefazodone.   HPI  Today, he is being contacted for a post-procedure assessment.  Post-Procedure Evaluation  Procedure (09/07/2020):   Type: Diagnostic Inter-Laminar Epidural Steroid Injection  #1  Region: Lumbar Level: L4-5 Level. Laterality: Midline   Sedation: Please see nurses note.  Effectiveness during initial hour after procedure(Ultra-Short Term Relief): 40 %   Local anesthetic used:  Long-acting (4-6 hours) Effectiveness: Defined as any analgesic benefit obtained secondary to the administration of local anesthetics. This carries significant diagnostic value as to the etiological location, or anatomical origin,   of the pain. Duration of benefit is expected to coincide with the duration of the local anesthetic used.  Effectiveness during initial 4-6 hours after procedure(Short-Term Relief): 40 %   Long-term benefit: Defined as any relief past the pharmacologic duration of the local anesthetics.  Effectiveness past the initial 6 hours after procedure(Long-Term Relief): 0 %  Current benefits: Defined as benefit that persist at this time.   Analgesia:  No benefit Function: No benefit ROM: No benefit   Laboratory Chemistry Profile   Renal Lab Results  Component Value Date   BUN 16 05/20/2020   CREATININE 1.00 05/20/2020   BCR 16 05/20/2020   GFRAA 85 05/20/2020   GFRNONAA 74 05/20/2020     Hepatic Lab Results  Component Value Date   AST 22 05/20/2020   ALT 23 05/20/2020   ALBUMIN 4.6 05/20/2020   ALKPHOS 124 (H) 05/20/2020     Electrolytes Lab Results  Component Value Date   NA 137 05/20/2020   K 4.6 05/20/2020   CL 103 05/20/2020   CALCIUM 9.5 05/20/2020     Bone No results found for: VD25OH, VD125OH2TOT, VD3125OH2, VD2125OH2, 25OHVITD1, 25OHVITD2, 25OHVITD3, TESTOFREE, TESTOSTERONE   Inflammation (CRP: Acute Phase) (ESR: Chronic Phase) No results found for: CRP, ESRSEDRATE, LATICACIDVEN     Note: Above Lab results reviewed.   Assessment  The primary encounter diagnosis was Spinal stenosis, lumbar region, with neurogenic claudication. Diagnoses of Chronic radicular lumbar pain and Chronic pain syndrome were also pertinent to this visit.  Plan of Care   Unfortunately no benefit after L-ESI. Has also tried L-fct blocks with Dr Chasnis without any benefit. Discussed pain management strategies for chronic pain syndrome. Encouraged PT exercises home.  Continue with Mobic PRN, counseled on risk of gastritis and CKD with long term NSAID use. Continue APAP 500 mg TID prn.   Follow-up plan:   Return if symptoms worsen or fail to improve.     IL L4/5 ESI #1 09/07/20- not helpful     Recent Visits Date Type Provider Dept  09/07/20 Procedure visit Lateef, Bilal, MD [facility]  07/13/20 Office Visit Lateef, Bilal, MD [facility]  Showing recent visits within past 90 days and meeting all other requirements Today's Visits Date Type Provider Dept  10/03/20 Telemedicine Lateef, Bilal, MD [facility]  Showing today's visits and meeting all other requirements Future Appointments No visits were found meeting these conditions. Showing future appointments within next 90 days and meeting all other requirements  I discussed the assessment and treatment plan with the patient. The patient was provided an opportunity to ask questions and all were answered. The patient agreed with the plan and demonstrated an understanding of the instructions.  Patient advised to call back or seek an in-person evaluation if the symptoms or condition worsens.  Duration of encounter: 20 minutes.  Note by: Bilal Lateef, MD Date: 10/03/2020; Time: 2:29 PM 

## 2020-10-24 ENCOUNTER — Ambulatory Visit (INDEPENDENT_AMBULATORY_CARE_PROVIDER_SITE_OTHER): Payer: Medicare Other | Admitting: Family Medicine

## 2020-10-24 ENCOUNTER — Encounter: Payer: Self-pay | Admitting: Family Medicine

## 2020-10-24 ENCOUNTER — Other Ambulatory Visit: Payer: Self-pay

## 2020-10-24 VITALS — BP 119/72 | HR 71 | Temp 97.8°F | Wt 215.8 lb

## 2020-10-24 DIAGNOSIS — C49A2 Gastrointestinal stromal tumor of stomach: Secondary | ICD-10-CM

## 2020-10-24 DIAGNOSIS — G894 Chronic pain syndrome: Secondary | ICD-10-CM

## 2020-10-24 DIAGNOSIS — I10 Essential (primary) hypertension: Secondary | ICD-10-CM

## 2020-10-24 DIAGNOSIS — R7303 Prediabetes: Secondary | ICD-10-CM

## 2020-10-24 DIAGNOSIS — E785 Hyperlipidemia, unspecified: Secondary | ICD-10-CM | POA: Insufficient documentation

## 2020-10-24 DIAGNOSIS — E663 Overweight: Secondary | ICD-10-CM | POA: Diagnosis not present

## 2020-10-24 DIAGNOSIS — E7800 Pure hypercholesterolemia, unspecified: Secondary | ICD-10-CM

## 2020-10-24 NOTE — Patient Instructions (Signed)
[facility] Address: [address], [place], [place] [address] Phone: [phone]

## 2020-10-24 NOTE — Assessment & Plan Note (Signed)
Well controlled Continue current medications Recheck metabolic panel F/u in 6 months  

## 2020-10-24 NOTE — Assessment & Plan Note (Signed)
Recommend low carb diet °Recheck A1c  °

## 2020-10-24 NOTE — Assessment & Plan Note (Signed)
Followed by GI and Onc

## 2020-10-24 NOTE — Assessment & Plan Note (Signed)
Previously well controlled  Reviewed last lipid panel Continue statin

## 2020-10-24 NOTE — Assessment & Plan Note (Signed)
Discussed importance of healthy weight management Discussed diet and exercise  

## 2020-10-24 NOTE — Assessment & Plan Note (Signed)
No relief from procedures Continue tylenol scheduled and NSAIDs prn Discussed risk of chronic NSAIDs

## 2020-10-24 NOTE — Progress Notes (Signed)
Established patient visit   Patient: Bobby Little   DOB: [birth year]   75 y.o. Male  MRN: [id number] Visit Date: 10/24/2020  Today's healthcare provider: [name], MD   Chief Complaint  Patient presents with  . Hypertension  . Hyperlipidemia    I,Porsha C McClurkin,acting as a scribe for [name], MD.,have documented all relevant documentation on the behalf of [name], MD,as directed by  [name], MD while in the presence of [name], MD.  Subjective    HPI  Hypertension, follow-up  BP Readings from Last 3 Encounters:  10/24/20 119/72  09/07/20 133/68  07/13/20 (!) 138/93   Wt Readings from Last 3 Encounters:  10/24/20 215 lb 12.8 oz (97.9 kg)  09/07/20 212 lb (96.2 kg)  07/13/20 212 lb (96.2 kg)     He was last seen for hypertension 5 months ago.  BP at that visit was 106/71. Management since that visit includes continue current medication.  He reports good compliance with treatment. He is not having side effects.  He is following a Regular diet. He is not exercising. He does smoke.  Use of agents associated with hypertension: none.   Outside blood pressures are arranging around 110's-130's/60's-70's  Symptoms: No chest pain No chest pressure  No palpitations No syncope  No dyspnea No orthopnea  No paroxysmal nocturnal dyspnea No lower extremity edema   Pertinent labs: Lab Results  Component Value Date   CHOL 162 05/20/2020   HDL 47 05/20/2020   LDLCALC 94 05/20/2020   TRIG 116 05/20/2020   CHOLHDL 3.4 05/20/2020   Lab Results  Component Value Date   NA 137 05/20/2020   K 4.6 05/20/2020   CREATININE 1.00 05/20/2020   GFRNONAA 74 05/20/2020   GFRAA 85 05/20/2020   GLUCOSE 102 (H) 05/20/2020     The 10-year ASCVD risk score Mikey Bussing DC Jr., et al., 2013) is: 42.7%   --------------------------------------------------------------------------------------------------- Lipid/Cholesterol,  Follow-up  Last lipid panel Other pertinent labs  Lab Results  Component Value Date   CHOL 162 05/20/2020   HDL 47 05/20/2020   LDLCALC 94 05/20/2020   TRIG 116 05/20/2020   CHOLHDL 3.4 05/20/2020   Lab Results  Component Value Date   ALT 23 05/20/2020   AST 22 05/20/2020   PLT 257 09/03/2017     He was last seen for this 5 months ago.  Management since that visit includes continue current medication.  He reports good compliance with treatment. He is not having side effects.   Symptoms: No chest pain No chest pressure/discomfort  No dyspnea No lower extremity edema  No numbness or tingling of extremity No orthopnea  No palpitations No paroxysmal nocturnal dyspnea  No speech difficulty No syncope   Current diet: well balanced, low salt Current exercise: none  The 10-year ASCVD risk score Mikey Bussing DC Jr., et al., 2013) is: 42.7%  ---------------------------------------------------------------------------------------------------   Social History   Tobacco Use  . Smoking status: Current Some Day Smoker    Years: 10.00    Types: Cigars  . Smokeless tobacco: Never Used  . Tobacco comment: 1 cigar 3-4 times year  Vaping Use  . Vaping Use: Never used  Substance Use Topics  . Alcohol use: No  . Drug use: No       Medications: Outpatient Medications Prior to Visit  Medication Sig  . acetaminophen (TYLENOL) 500 MG tablet Take 500 mg by mouth.  Marland Kitchen atorvastatin (LIPITOR) 40 MG tablet Take 0.5 tablets (20  mg total) by mouth daily.  . clonazePAM (KLONOPIN) 1 MG tablet Take 1 mg by mouth 2 (two) times daily.  . meloxicam (MOBIC) 15 MG tablet Take 1 tablet (15 mg total) by mouth daily.  Marland Kitchen olmesartan (BENICAR) 40 MG tablet Take 1 tablet (40 mg total) by mouth daily.   No facility-administered medications prior to visit.    Review of Systems  Constitutional: Negative.   Respiratory: Negative.   Cardiovascular: Negative.   Hematological: Negative.        Objective     BP 119/72 (BP Location: Left Arm, Patient Position: Sitting, Cuff Size: Normal)   Pulse 71   Temp 97.8 F (36.6 C) (Oral)   Wt 215 lb 12.8 oz (97.9 kg)   SpO2 98%   BMI 29.27 kg/m     Physical Exam Vitals reviewed.  Constitutional:      General: He is not in acute distress.    Appearance: Normal appearance. He is not diaphoretic.  HENT:     Head: Normocephalic and atraumatic.  Eyes:     General: No scleral icterus.    Conjunctiva/sclera: Conjunctivae normal.  Cardiovascular:     Rate and Rhythm: Normal rate and regular rhythm.     Pulses: Normal pulses.     Heart sounds: Normal heart sounds. No murmur heard.   Pulmonary:     Effort: Pulmonary effort is normal. No respiratory distress.     Breath sounds: Normal breath sounds. No wheezing or rhonchi.  Musculoskeletal:     Cervical back: Neck supple.     Right lower leg: No edema.     Left lower leg: No edema.  Lymphadenopathy:     Cervical: No cervical adenopathy.  Skin:    General: Skin is warm and dry.  Neurological:     Mental Status: He is alert and oriented to person, place, and time. Mental status is at baseline.  Psychiatric:        Mood and Affect: Mood normal.        Behavior: Behavior normal.       No results found for any visits on 10/24/20.  Assessment & Plan     Problem List Items Addressed This Visit      Cardiovascular and Mediastinum   Essential hypertension - Primary    Well controlled Continue current medications Recheck metabolic panel F/u in 6 months       Relevant Orders   Basic Metabolic Panel (BMET)     Digestive   GIST, malignant ([place])    Followed by GI and Onc        Other   Prediabetes    Recommend low carb diet Recheck A1c      Relevant Orders   Hemoglobin A1c   Overweight    Discussed importance of healthy weight management Discussed diet and exercise       Chronic pain syndrome    No relief from procedures Continue tylenol scheduled and NSAIDs  prn Discussed risk of chronic NSAIDs      Hyperlipidemia    Previously well controlled  Reviewed last lipid panel Continue statin          Return in about 6 months (around 04/25/2021) for CPE, AWV.      I, [name], MD, have reviewed all documentation for this visit. The documentation on 10/24/20 for the exam, diagnosis, procedures, and orders are all accurate and complete.   Benyamin Jeff, Dionne Bucy, MD, MPH [facility]

## 2020-10-25 LAB — BASIC METABOLIC PANEL
BUN/Creatinine Ratio: 15 (ref 10–24)
BUN: 18 mg/dL (ref 8–27)
CO2: 19 mmol/L — ABNORMAL LOW (ref 20–29)
Calcium: 9.5 mg/dL (ref 8.6–10.2)
Chloride: 103 mmol/L (ref 96–106)
Creatinine, Ser: 1.23 mg/dL (ref 0.76–1.27)
Glucose: 132 mg/dL — ABNORMAL HIGH (ref 65–99)
Potassium: 4.8 mmol/L (ref 3.5–5.2)
Sodium: 138 mmol/L (ref 134–144)
eGFR: 62 mL/min/{1.73_m2} (ref >59)

## 2020-10-25 LAB — HEMOGLOBIN A1C
Est. average glucose Bld gHb Est-mCnc: 126 mg/dL
Hgb A1c MFr Bld: 6 % — ABNORMAL HIGH (ref 4.8–5.6)

## 2020-10-31 ENCOUNTER — Telehealth: Payer: Self-pay

## 2020-10-31 NOTE — Telephone Encounter (Signed)
LMTCB 10/31/2020.  PEC please advise pt of lab results when he calls back.     Thanks,   -Mickel Baas

## 2020-10-31 NOTE — Progress Notes (Signed)
Glucose elevated hemoglobin A1C is 6.0 prediabetes, diet and lifestyle changes with increased exercise to prevent diabetes is advised. Keep follow up with PCP as advised.

## 2020-10-31 NOTE — Telephone Encounter (Signed)
-----   Message from Doreen Beam, Loco sent at 10/31/2020  9:37 AM EDT ----- Glucose elevated hemoglobin A1C is 6.0 prediabetes, diet and lifestyle changes with increased exercise to prevent diabetes is advised. Keep follow up with PCP as advised.

## 2020-11-03 ENCOUNTER — Encounter: Payer: Self-pay | Admitting: Family Medicine

## 2020-11-07 NOTE — Telephone Encounter (Signed)
See result note, patient has been advised. KW

## 2020-11-17 ENCOUNTER — Ambulatory Visit: Payer: Self-pay | Admitting: Family Medicine

## 2020-11-28 ENCOUNTER — Encounter: Payer: Self-pay | Admitting: Family Medicine

## 2020-12-19 ENCOUNTER — Other Ambulatory Visit: Payer: Self-pay | Admitting: Family Medicine

## 2020-12-19 DIAGNOSIS — I10 Essential (primary) hypertension: Secondary | ICD-10-CM

## 2020-12-20 NOTE — Telephone Encounter (Signed)
Requested Prescriptions  Pending Prescriptions Disp Refills  . olmesartan (BENICAR) 40 MG tablet [Pharmacy Med Name: OLMESARTAN MEDOXOMIL 40 MG Tablet] 90 tablet 2    Sig: TAKE 1 TABLET (40 MG TOTAL) BY MOUTH DAILY.     Cardiovascular:  Angiotensin Receptor Blockers Passed - 12/19/2020  8:23 PM      Passed - Cr in normal range and within 180 days    Creatinine, Ser  Date Value Ref Range Status  10/24/2020 1.23 0.76 - 1.27 mg/dL Final         Passed - K in normal range and within 180 days    Potassium  Date Value Ref Range Status  10/24/2020 4.8 3.5 - 5.2 mmol/L Final         Passed - Patient is not pregnant      Passed - Last BP in normal range    BP Readings from Last 1 Encounters:  10/24/20 119/72         Passed - Valid encounter within last 6 months    Recent Outpatient Visits          1 month ago Essential hypertension   [place], Dionne Bucy, MD   7 months ago Essential hypertension   [place], Dionne Bucy, MD   9 months ago Essential hypertension   [facility], Dionne Bucy, MD

## 2021-01-07 ENCOUNTER — Encounter: Payer: Self-pay | Admitting: Family Medicine

## 2021-01-30 ENCOUNTER — Ambulatory Visit: Payer: Medicare Other | Admitting: Family Medicine

## 2021-03-30 ENCOUNTER — Encounter: Payer: Self-pay | Admitting: Family Medicine

## 2021-03-30 ENCOUNTER — Ambulatory Visit (INDEPENDENT_AMBULATORY_CARE_PROVIDER_SITE_OTHER): Payer: Medicare Other | Admitting: Family Medicine

## 2021-03-30 ENCOUNTER — Other Ambulatory Visit: Payer: Self-pay

## 2021-03-30 VITALS — BP 115/68 | HR 56 | Temp 97.9°F | Ht 72.0 in | Wt 187.5 lb

## 2021-03-30 DIAGNOSIS — M5440 Lumbago with sciatica, unspecified side: Secondary | ICD-10-CM | POA: Diagnosis not present

## 2021-03-30 DIAGNOSIS — R7303 Prediabetes: Secondary | ICD-10-CM | POA: Diagnosis not present

## 2021-03-30 DIAGNOSIS — M48062 Spinal stenosis, lumbar region with neurogenic claudication: Secondary | ICD-10-CM

## 2021-03-30 DIAGNOSIS — R3911 Hesitancy of micturition: Secondary | ICD-10-CM

## 2021-03-30 DIAGNOSIS — N401 Enlarged prostate with lower urinary tract symptoms: Secondary | ICD-10-CM

## 2021-03-30 DIAGNOSIS — F4321 Adjustment disorder with depressed mood: Secondary | ICD-10-CM

## 2021-03-30 DIAGNOSIS — R634 Abnormal weight loss: Secondary | ICD-10-CM | POA: Diagnosis not present

## 2021-03-30 DIAGNOSIS — G8929 Other chronic pain: Secondary | ICD-10-CM

## 2021-03-30 DIAGNOSIS — I10 Essential (primary) hypertension: Secondary | ICD-10-CM | POA: Diagnosis not present

## 2021-03-30 DIAGNOSIS — C49A2 Gastrointestinal stromal tumor of stomach: Secondary | ICD-10-CM

## 2021-03-30 DIAGNOSIS — R1032 Left lower quadrant pain: Secondary | ICD-10-CM

## 2021-03-30 NOTE — Assessment & Plan Note (Signed)
-   History of GIST, previously followed by GI - Given recent weight loss, will recheck CBC, CMP, A1c, and TSH - Pt. Declines referral back to GI at this time - Will continue to monitor

## 2021-03-30 NOTE — Assessment & Plan Note (Addendum)
-   Chronic and stable - Pt. States that he discontinued seeing pain management and declines another referral at this time - Discussed lifestyle modifications - Continue Tylenol and Voltaren gel prn for back pain - Will continue to monitor

## 2021-03-30 NOTE — Assessment & Plan Note (Signed)
-   Chronic, elevated BP in office today, which pt. attributes to current back pain - States that home BP's have been ~110s/60s - Will defer medication changes at this time, but will reassess at next visit - Encouraged pt. to continue checking BP at home

## 2021-03-30 NOTE — Assessment & Plan Note (Signed)
-   Likely 2/2 recent dietary changes; however, will recheck CBC, CMP, TSH, and A1c to rule out underlying causes - Risk factors include remote history of malignant GIST; however, patient denies fevers, night sweats, and easy bruising/bleeding - Will continue to monitor

## 2021-03-30 NOTE — Assessment & Plan Note (Signed)
-   Pt. endorses depressed mood 2/2 chronic back pain - He declines starting additional medications at this time - He reports receiving adequate social supports from his faith community - Continue Klonopin prn for anxiety attacks

## 2021-03-30 NOTE — Assessment & Plan Note (Addendum)
-   Chronic, previously saw GI - Pt. notes improvement in constipation & abd pain since increasing fiber intake - Encouraged pt. to continue eating fiber-rich diet and whole foods - Pt. declines referral back to GI at this time

## 2021-03-30 NOTE — Assessment & Plan Note (Signed)
-   Chronic and stable - Pt. States that he discontinued seeing pain management and declines another referral at this time - Discussed lifestyle modifications - Continue Tylenol and Voltaren gel prn for back pain - Will continue to monitor

## 2021-03-30 NOTE — Assessment & Plan Note (Signed)
-   Chronic, recently developed urinary hesitancy and frequency - Pt. Declines starting Flomax at this time, but may reconsider if symptoms worsen - Recheck PSA today - May consider urology referral pending PSA results

## 2021-03-30 NOTE — Progress Notes (Signed)
Established patient visit   Patient: Bobby Little   DOB: 02/05/1946   75 y.o. Male  MRN: [id number] Visit Date: 03/30/2021  Today's healthcare provider: [name], MD   Chief Complaint  Patient presents with   Abdominal Pain   Weight Loss   Prediabetes   Subjective    HPI  Weight Loss - Pt. reports weight loss of 30 pounds in the past 6 months - Endorses recent dietary changes, cut out dairy & cut down on carbs recently - States that he participates in little physical activity due to his chronic back pain - Denies fevers, night sweats, and easy bleeding/bruising  Abdominal Pain - History of GIST, previously following with GI, but hasn't seen them in the past year - Reports chronic constipation, but notes improvement in constipation & abdominal pain since increasing fiber intake - Chronic LLQ pain with recent exacerbations  History of Renal Cyst - Due for repeat renal US on 04/29/21  Urinary Frequency - Reports increased urinary frequency and difficulty "starting my stream" - Denies nighttime awakenings to urinate - Denies dysuria, hematuria, and fevers  Depression - Pt. notes a lower mood due to inability to continue his passions of hunting/fishing 2/2 his chronic back pain  Spinal Stenosis - Chronic back pain, previously following with pain management - Uses Tylenol and Voltaren prn for acute flares  Hypertension - Not currently managed on medications - Systolic BP elevated in office today, which pt. attributes to back pain - States that home BP typically ranges ~110s/60s   Medications: Outpatient Medications Prior to Visit  Medication Sig   acetaminophen (TYLENOL) 500 MG tablet Take 500 mg by mouth.   atorvastatin (LIPITOR) 40 MG tablet Take 0.5 tablets (20 mg total) by mouth daily.   clonazePAM (KLONOPIN) 1 MG tablet Take 1 mg by mouth 2 (two) times daily.   olmesartan (BENICAR) 40 MG tablet TAKE 1 TABLET (40 MG TOTAL) BY MOUTH DAILY.    [DISCONTINUED] meloxicam (MOBIC) 15 MG tablet Take 1 tablet (15 mg total) by mouth daily. (Patient not taking: Reported on 03/30/2021)   No facility-administered medications prior to visit.    Review of Systems  Constitutional:  Positive for activity change and unexpected weight change. Negative for appetite change, chills, diaphoresis, fatigue and fever.  HENT: Negative.    Eyes: Negative.  Negative for visual disturbance.  Respiratory:  Negative for chest tightness and shortness of breath.   Gastrointestinal:  Positive for abdominal pain. Negative for constipation, diarrhea, nausea and vomiting.  Endocrine: Positive for polyuria. Negative for polydipsia.  Genitourinary:  Positive for difficulty urinating and frequency. Negative for decreased urine volume, dysuria, hematuria and urgency.  Musculoskeletal:  Positive for back pain.  Skin: Negative.   Neurological: Negative.   Psychiatric/Behavioral: Negative.        Objective    BP 115/68 Comment: home readings  Pulse (!) 56   Temp 97.9 F (36.6 C) (Oral)   Ht 6' (1.829 m)   Wt 187 lb 8 oz (85 kg)   BMI 25.43 kg/m  {Show previous vital signs (optional):23777}   Physical Exam Constitutional:      General: He is not in acute distress.    Appearance: He is well-developed and normal weight. He is not toxic-appearing.  HENT:     Head: Normocephalic and atraumatic.  Eyes:     General: No scleral icterus. Cardiovascular:     Rate and Rhythm: Normal rate and regular rhythm.     Heart  sounds: Normal heart sounds.  Pulmonary:     Effort: Pulmonary effort is normal.     Breath sounds: Normal breath sounds.  Abdominal:     General: Abdomen is flat. A surgical scar is present. Bowel sounds are normal.     Palpations: Abdomen is soft. There is no hepatomegaly or splenomegaly.     Tenderness: There is no abdominal tenderness.     Hernia: A hernia is present. Hernia is present in the ventral area.  Skin:    General: Skin is warm and  dry.  Neurological:     Mental Status: He is alert.  Psychiatric:        Mood and Affect: Mood normal.        Behavior: Behavior normal.     No results found for any visits on 03/30/21.  Assessment & Plan     Problem List Items Addressed This Visit       Cardiovascular and Mediastinum   Essential hypertension    - Chronic, elevated BP in office today, which pt. attributes to current back pain - States that home BP's have been ~110s/60s - Will defer medication changes at this time, but will reassess at next visit - Encouraged pt. to continue checking BP at home      Relevant Orders   Comprehensive metabolic panel     Digestive   GIST, malignant ([place])    - History of GIST, previously followed by GI - Given recent weight loss, will recheck CBC, CMP, A1c, and TSH - Pt. Declines referral back to GI at this time - Will continue to monitor      Relevant Orders   CBC w/Diff/Platelet   Comprehensive metabolic panel   TSH     Nervous and Auditory   Chronic bilateral low back pain with sciatica    - Chronic and stable - Pt. States that he discontinued seeing pain management and declines another referral at this time - Discussed lifestyle modifications - Continue Tylenol and Voltaren gel prn for back pain - Will continue to monitor        Genitourinary   Benign prostatic hyperplasia with urinary hesitancy    - Chronic, recently developed urinary hesitancy and frequency - Pt. Declines starting Flomax at this time, but may reconsider if symptoms worsen - Recheck PSA today - May consider urology referral pending PSA results      Relevant Orders   PSA Total (Reflex To Free)     Other   Spinal stenosis, lumbar region, with neurogenic claudication    - Chronic and stable - Pt. States that he discontinued seeing pain management and declines another referral at this time - Discussed lifestyle modifications - Continue Tylenol and Voltaren gel prn for back pain - Will  continue to monitor      Left lower quadrant abdominal pain    - Chronic, previously saw GI - Pt. notes improvement in constipation & abd pain since increasing fiber intake - Encouraged pt. to continue eating fiber-rich diet and whole foods - Pt. declines referral back to GI at this time      Prediabetes - Primary    - Chronic, previously stable - Not currently managed on medications - Recheck A1c      Relevant Orders   Hemoglobin A1c   Weight loss    - Likely 2/2 recent dietary changes; however, will recheck CBC, CMP, TSH, and A1c to rule out underlying causes - Risk factors include remote history of malignant GIST;  however, patient denies fevers, night sweats, and easy bruising/bleeding - Will continue to monitor      Relevant Orders   CBC w/Diff/Platelet   TSH   Adjustment disorder with depressed mood    - Pt. endorses depressed mood 2/2 chronic back pain - He declines starting additional medications at this time - He reports receiving adequate social supports from his faith community - Continue Klonopin prn for anxiety attacks       Declines Cymbalta and flomax today  Return in about 6 months (around 09/27/2021) for AWV, chronic disease f/u.      Percell Locus, MS3   Patient seen along with MS3 student Percell Locus. I personally evaluated this patient along with the student, and verified all aspects of the history, physical exam, and medical decision making as documented by the student. I agree with the student's documentation and have made all necessary edits.  Hadlee Burback, Dionne Bucy, MD, MPH [facility]

## 2021-03-30 NOTE — Assessment & Plan Note (Signed)
-   Chronic, previously stable - Not currently managed on medications - Recheck A1c

## 2021-04-04 LAB — CBC WITH DIFFERENTIAL/PLATELET
Basophils Absolute: 0.1 10*3/uL (ref 0.0–0.2)
Basos: 1 %
EOS (ABSOLUTE): 0.5 10*3/uL — ABNORMAL HIGH (ref 0.0–0.4)
Eos: 5 %
Hematocrit: 42.3 % (ref 37.5–51.0)
Hemoglobin: 14.8 g/dL (ref 13.0–17.7)
Immature Grans (Abs): 0 10*3/uL (ref 0.0–0.1)
Immature Granulocytes: 0 %
Lymphocytes Absolute: 3.9 10*3/uL — ABNORMAL HIGH (ref 0.7–3.1)
Lymphs: 36 %
MCH: 31.2 pg (ref 26.6–33.0)
MCHC: 35 g/dL (ref 31.5–35.7)
MCV: 89 fL (ref 79–97)
Monocytes Absolute: 0.9 10*3/uL (ref 0.1–0.9)
Monocytes: 8 %
Neutrophils Absolute: 5.4 10*3/uL (ref 1.4–7.0)
Neutrophils: 50 %
Platelets: 227 10*3/uL (ref 150–450)
RBC: 4.74 x10E6/uL (ref 4.14–5.80)
RDW: 12.4 % (ref 11.6–15.4)
WBC: 10.8 10*3/uL (ref 3.4–10.8)

## 2021-04-04 LAB — COMPREHENSIVE METABOLIC PANEL
ALT: 17 IU/L (ref 0–44)
AST: 22 IU/L (ref 0–40)
Albumin/Globulin Ratio: 2 (ref 1.2–2.2)
Albumin: 4.5 g/dL (ref 3.7–4.7)
Alkaline Phosphatase: 101 IU/L (ref 44–121)
BUN/Creatinine Ratio: 13 (ref 10–24)
BUN: 13 mg/dL (ref 8–27)
Bilirubin Total: 0.4 mg/dL (ref 0.0–1.2)
CO2: 21 mmol/L (ref 20–29)
Calcium: 9.6 mg/dL (ref 8.6–10.2)
Chloride: 105 mmol/L (ref 96–106)
Creatinine, Ser: 1.03 mg/dL (ref 0.76–1.27)
Globulin, Total: 2.2 g/dL (ref 1.5–4.5)
Glucose: 99 mg/dL (ref 65–99)
Potassium: 4.7 mmol/L (ref 3.5–5.2)
Sodium: 142 mmol/L (ref 134–144)
Total Protein: 6.7 g/dL (ref 6.0–8.5)
eGFR: 76 mL/min/{1.73_m2} (ref >59)

## 2021-04-04 LAB — PSA TOTAL (REFLEX TO FREE): Prostate Specific Ag, Serum: 1 ng/mL (ref 0.0–4.0)

## 2021-04-04 LAB — TSH: TSH: 3.14 u[IU]/mL (ref 0.450–4.500)

## 2021-04-04 LAB — HEMOGLOBIN A1C
Est. average glucose Bld gHb Est-mCnc: 117 mg/dL
Hgb A1c MFr Bld: 5.7 % — ABNORMAL HIGH (ref 4.8–5.6)

## 2021-04-12 ENCOUNTER — Other Ambulatory Visit: Payer: Self-pay

## 2021-04-12 ENCOUNTER — Ambulatory Visit (INDEPENDENT_AMBULATORY_CARE_PROVIDER_SITE_OTHER): Payer: Medicare Other

## 2021-04-12 DIAGNOSIS — Z23 Encounter for immunization: Secondary | ICD-10-CM

## 2021-05-02 ENCOUNTER — Encounter: Payer: Self-pay | Admitting: Family Medicine

## 2021-05-18 ENCOUNTER — Ambulatory Visit
Admission: RE | Admit: 2021-05-18 | Discharge: 2021-05-18 | Disposition: A | Discharge status: Home / Self Care | Payer: Medicare Other | Source: Ambulatory Visit | Attending: Nephrology | Admitting: Nephrology

## 2021-05-18 ENCOUNTER — Other Ambulatory Visit: Payer: Self-pay

## 2021-05-18 ENCOUNTER — Ambulatory Visit: Payer: PRIVATE HEALTH INSURANCE

## 2021-05-18 DIAGNOSIS — N281 Cyst of kidney, acquired: Secondary | ICD-10-CM | POA: Diagnosis not present

## 2021-06-14 ENCOUNTER — Ambulatory Visit: Payer: Medicare Other | Admitting: Gastroenterology

## 2021-06-14 ENCOUNTER — Other Ambulatory Visit: Payer: Self-pay | Admitting: Family Medicine

## 2021-06-14 DIAGNOSIS — E785 Hyperlipidemia, unspecified: Secondary | ICD-10-CM

## 2021-06-14 DIAGNOSIS — E1169 Type 2 diabetes mellitus with other specified complication: Secondary | ICD-10-CM

## 2021-06-21 ENCOUNTER — Ambulatory Visit: Payer: Medicare Other | Admitting: Gastroenterology

## 2021-07-02 ENCOUNTER — Encounter: Payer: Self-pay | Admitting: Family Medicine

## 2021-07-12 ENCOUNTER — Encounter: Payer: Self-pay | Admitting: Family Medicine

## 2021-07-12 DIAGNOSIS — E785 Hyperlipidemia, unspecified: Secondary | ICD-10-CM

## 2021-07-12 DIAGNOSIS — I10 Essential (primary) hypertension: Secondary | ICD-10-CM

## 2021-07-12 MED ORDER — ATORVASTATIN CALCIUM 40 MG PO TABS
20.0000 mg | ORAL_TABLET | Freq: Every day | ORAL | 1 refills | Status: DC
Start: 2021-07-12 — End: 2021-08-21

## 2021-07-12 MED ORDER — OLMESARTAN MEDOXOMIL 40 MG PO TABS: 40.0000 mg | ORAL_TABLET | Freq: Every day | ORAL | 2 refills | Status: DC

## 2021-08-08 ENCOUNTER — Ambulatory Visit: Payer: Self-pay

## 2021-08-08 ENCOUNTER — Encounter: Payer: Self-pay | Admitting: Family Medicine

## 2021-08-08 NOTE — Telephone Encounter (Signed)
°  Chief Complaint: left scrotal swelling Symptoms: left scrotum 4 times larger than the right ; left side of back and left hip pain Frequency: unknown - pt stated he does not know when it first appeared Pertinent Negatives: Patient denies pain, fever, abdominal pain, vomiting,difficulty passing urine Disposition: [] ED /[] Urgent Care (no appt availability in office) / [x] Appointment(In office/virtual)/ []  [facility]/ [] Home Care/ [] Refused Recommended Disposition /[] [facility]/ []  Follow-up with PCP Additional Notes: pt has appt tomorrow      Reason for Disposition  [1] Scrotum swelling AND [2] no pain  Answer Assessment - Initial Assessment Questions 1. SCROTAL SWELLING: "What does the scrotum look like?" "How swollen is it?" (mild, moderate severe; compare to other side)     Left:larger 4  x the size right testicle: no swelling 2. LOCATION: "Where is the swelling located?"     Left testicle 3. ONSET: "When did the swelling start?"     unknown 4. PATTERN: "Does it come and go, or has it been constant since it started?"     constant 5. SCROTAL PAIN: "Is there any pain?" If Yes, ask: "How bad is it?"  (Scale 1-10; or mild, moderate, severe)     no 6. HERNIA: "Has a doctor ever told you that you have a hernia?"     Yes 7. OTHER SYMPTOMS: "Do you have any other symptoms?" (e.g., fever, abdominal pain, vomiting, difficulty passing urine)     Back left hip pain,  Protocols used: Scrotum Swelling-A-AH

## 2021-08-09 ENCOUNTER — Other Ambulatory Visit: Payer: Self-pay

## 2021-08-09 ENCOUNTER — Other Ambulatory Visit: Payer: Medicare Other

## 2021-08-09 ENCOUNTER — Ambulatory Visit (INDEPENDENT_AMBULATORY_CARE_PROVIDER_SITE_OTHER): Payer: Medicare Other | Admitting: Family Medicine

## 2021-08-09 ENCOUNTER — Ambulatory Visit: Payer: Medicare Other | Admitting: Physician Assistant

## 2021-08-09 ENCOUNTER — Ambulatory Visit
Admission: RE | Admit: 2021-08-09 | Discharge: 2021-08-09 | Disposition: A | Discharge status: Home / Self Care | Payer: Medicare Other | Source: Ambulatory Visit | Attending: Family Medicine | Admitting: Family Medicine

## 2021-08-09 ENCOUNTER — Encounter: Payer: Self-pay | Admitting: Family Medicine

## 2021-08-09 VITALS — BP 131/78 | HR 60 | Temp 97.9°F | Ht 72.0 in | Wt 183.5 lb

## 2021-08-09 DIAGNOSIS — N5089 Other specified disorders of the male genital organs: Secondary | ICD-10-CM | POA: Diagnosis present

## 2021-08-09 NOTE — Progress Notes (Signed)
°  ° °  I,[name],acting as a Education administrator for Lelon Huh, MD.,have documented all relevant documentation on the behalf of Lelon Huh, MD,as directed by  Lelon Huh, MD while in the presence of Lelon Huh, MD.   Established patient visit   Patient: Bobby Little   DOB: 12-25-1945   76 y.o. Male  MRN: [id number] Visit Date: 08/09/2021  Today's healthcare provider: Lelon Huh, MD   No chief complaint on file.  Subjective    HPI  Patient reports swollen left test that he noticed about a week ago when he was in the shower. It wasn't really painful at first, but since then has been increasingly bothersome. Reports that he sat on a heating pad last night for a few hours.He has known hip problems but does state that he currently have pain on left side near hip and groin area that is constant and keeps him up at night. Describes pain as a piercing pain. No signs of irritation or redness just swelling. Reports bowel movements have been hard to get out some days. They have been longer and harder. No concerns with urinating.   Medications: Outpatient Medications Prior to Visit  Medication Sig   acetaminophen (TYLENOL) 500 MG tablet Take 500 mg by mouth.   atorvastatin (LIPITOR) 40 MG tablet Take 0.5 tablets (20 mg total) by mouth daily.   clonazePAM (KLONOPIN) 1 MG tablet Take 1 mg by mouth 2 (two) times daily.   olmesartan (BENICAR) 40 MG tablet Take 1 tablet (40 mg total) by mouth daily.   No facility-administered medications prior to visit.    Review of Systems  Genitourinary:  Positive for scrotal swelling and testicular pain.      Objective    BP 131/78 (BP Location: Right Arm, Patient Position: Sitting, Cuff Size: Normal)    Pulse 60    Temp 97.9 F (36.6 C) (Oral)    Ht 6' (1.829 m)    Wt 183 lb 8 oz (83.2 kg)    SpO2 98%    BMI 24.89 kg/m  {Show previous vital signs (optional):23777}  Physical Exam  Moderate swelling both testicles minimal tenderness. Feels like  cystic structure posterior and left of midline.    Assessment & Plan     1. Swelling of left testicle  - US SCROTUM W/DOPPLER; Future         The entirety of the information documented in the History of Present Illness, Review of Systems and Physical Exam were personally obtained by me. Portions of this information were initially documented by the CMA and reviewed by me for thoroughness and accuracy.     Lelon Huh, MD  [facility] [phone] (phone) [phone] (fax)  [place]

## 2021-08-10 ENCOUNTER — Encounter: Payer: Self-pay | Admitting: Family Medicine

## 2021-08-21 ENCOUNTER — Telehealth: Payer: Self-pay | Admitting: Family Medicine

## 2021-08-21 ENCOUNTER — Other Ambulatory Visit: Payer: Self-pay

## 2021-08-21 DIAGNOSIS — E1169 Type 2 diabetes mellitus with other specified complication: Secondary | ICD-10-CM

## 2021-08-21 DIAGNOSIS — E785 Hyperlipidemia, unspecified: Secondary | ICD-10-CM

## 2021-08-21 MED ORDER — ATORVASTATIN CALCIUM 40 MG PO TABS: 20.0000 mg | ORAL_TABLET | Freq: Every day | ORAL | 0 refills | Status: DC

## 2021-08-21 NOTE — Telephone Encounter (Signed)
[facility] faxed refill request for the following medications:  atorvastatin (LIPITOR) 40 MG tablet   Please advise.

## 2021-09-06 ENCOUNTER — Telehealth: Payer: Self-pay

## 2021-09-06 ENCOUNTER — Other Ambulatory Visit: Payer: Self-pay

## 2021-09-06 DIAGNOSIS — Z1211 Encounter for screening for malignant neoplasm of colon: Secondary | ICD-10-CM

## 2021-09-06 MED ORDER — PEG 3350-KCL-NA BICARB-NACL 420 G PO SOLR: 4000.0000 mL | Freq: Once | ORAL | 0 refills | Status: AC

## 2021-09-06 NOTE — Telephone Encounter (Signed)
Lmtcb. PEC please schedule appt for patient when he calls back with Dr. Brita Romp.

## 2021-09-06 NOTE — Telephone Encounter (Signed)
Patient is wanting to schedule a recall colonoscopy with Dr. Vicente Males

## 2021-09-06 NOTE — Progress Notes (Signed)
Gastroenterology Pre-Procedure Review  Request Date: 11/10/2021 Requesting Physician: Dr. Vicente Males   PATIENT REVIEW QUESTIONS: The patient responded to the following health history questions as indicated:    1. Are you having any GI issues? no 2. Do you have a personal history of Polyps? no 3. Do you have a family history of Colon Cancer or Polyps? no 4. Diabetes Mellitus? no 5. Joint replacements in the past 12 months?no 6. Major health problems in the past 3 months?no 7. Any artificial heart valves, MVP, or defibrillator?no    MEDICATIONS & ALLERGIES:    Patient reports the following regarding taking any anticoagulation/antiplatelet therapy:   Plavix, Coumadin, Eliquis, Xarelto, Lovenox, Pradaxa, Brilinta, or Effient? no Aspirin? no  Patient confirms/reports the following medications:  Current Outpatient Medications  Medication Sig Dispense Refill   acetaminophen (TYLENOL) 500 MG tablet Take 500 mg by mouth.     atorvastatin (LIPITOR) 40 MG tablet Take 0.5 tablets (20 mg total) by mouth daily. 45 tablet 0   clonazePAM (KLONOPIN) 1 MG tablet Take 1 mg by mouth 2 (two) times daily.     olmesartan (BENICAR) 40 MG tablet Take 1 tablet (40 mg total) by mouth daily. 90 tablet 2   No current facility-administered medications for this visit.    Patient confirms/reports the following allergies:  Allergies  Allergen Reactions   Beta Adrenergic Blockers     Dizziness, low heart rate   Codeine Nausea And Vomiting   Trazodone And Nefazodone Other (See Comments)    insomnia    No orders of the defined types were placed in this encounter.   AUTHORIZATION INFORMATION Primary Insurance: 1D#: Group #:  Secondary Insurance: 1D#: Group #:  SCHEDULE INFORMATION: Date:11/10/2021  Time: Location:[facility]

## 2021-09-06 NOTE — Telephone Encounter (Signed)
CRM created. 

## 2021-09-06 NOTE — Telephone Encounter (Signed)
Copied from [place] [id number]. Topic: Appointment Scheduling - Scheduling Inquiry for Clinic >> Sep 06, 2021 11:39 AM Antonieta Iba C wrote: Reason for CRM: pt called in for assistance. Pt says that he is getting a colonoscopy on 4/25. Pt would like to have wellness visit rescheduled until after. Unable to schedule, not showing a schedule. Please assist pt further.

## 2021-09-29 ENCOUNTER — Ambulatory Visit: Payer: Medicare Other | Admitting: Family Medicine

## 2021-10-13 ENCOUNTER — Telehealth: Payer: Self-pay | Admitting: Family Medicine

## 2021-10-13 DIAGNOSIS — E1169 Type 2 diabetes mellitus with other specified complication: Secondary | ICD-10-CM

## 2021-10-13 MED ORDER — ATORVASTATIN CALCIUM 40 MG PO TABS: 20.0000 mg | ORAL_TABLET | Freq: Every day | ORAL | 0 refills | Status: DC

## 2021-10-13 NOTE — Addendum Note (Signed)
Addended by: Shawna Orleans on: 10/13/2021 04:52 PM ? ? Modules accepted: Orders ? ?

## 2021-10-13 NOTE — Telephone Encounter (Signed)
[facility] faxed refill request for the following medications:  atorvastatin (LIPITOR) 40 MG tablet    Please advise.  

## 2021-10-18 ENCOUNTER — Encounter: Payer: Self-pay | Admitting: Family Medicine

## 2021-10-18 DIAGNOSIS — E1169 Type 2 diabetes mellitus with other specified complication: Secondary | ICD-10-CM

## 2021-10-18 DIAGNOSIS — I10 Essential (primary) hypertension: Secondary | ICD-10-CM

## 2021-10-18 MED ORDER — ATORVASTATIN CALCIUM 40 MG PO TABS: 40.0000 mg | ORAL_TABLET | Freq: Every day | ORAL | 0 refills | Status: DC

## 2021-10-18 MED ORDER — OLMESARTAN MEDOXOMIL 40 MG PO TABS: 40.0000 mg | ORAL_TABLET | Freq: Every day | ORAL | 2 refills | Status: DC

## 2021-11-06 ENCOUNTER — Encounter: Payer: Self-pay | Admitting: Gastroenterology

## 2021-11-06 ENCOUNTER — Other Ambulatory Visit: Payer: Self-pay

## 2021-11-10 ENCOUNTER — Ambulatory Visit: Payer: Medicare Other | Admitting: Certified Registered Nurse Anesthetist

## 2021-11-10 ENCOUNTER — Encounter
Admission: RE | Disposition: A | Discharge status: Home / Self Care | Payer: Self-pay | Source: Home / Self Care | Attending: Gastroenterology

## 2021-11-10 ENCOUNTER — Encounter: Payer: Self-pay | Admitting: Gastroenterology

## 2021-11-10 ENCOUNTER — Ambulatory Visit
Admission: RE | Admit: 2021-11-10 | Discharge: 2021-11-10 | Disposition: A | Discharge status: Home / Self Care | Payer: Medicare Other | Attending: Gastroenterology | Admitting: Gastroenterology

## 2021-11-10 DIAGNOSIS — D122 Benign neoplasm of ascending colon: Secondary | ICD-10-CM | POA: Diagnosis not present

## 2021-11-10 DIAGNOSIS — Z1211 Encounter for screening for malignant neoplasm of colon: Secondary | ICD-10-CM | POA: Insufficient documentation

## 2021-11-10 DIAGNOSIS — I10 Essential (primary) hypertension: Secondary | ICD-10-CM | POA: Insufficient documentation

## 2021-11-10 DIAGNOSIS — K635 Polyp of colon: Secondary | ICD-10-CM | POA: Diagnosis not present

## 2021-11-10 DIAGNOSIS — M1990 Unspecified osteoarthritis, unspecified site: Secondary | ICD-10-CM | POA: Diagnosis not present

## 2021-11-10 DIAGNOSIS — K5730 Diverticulosis of large intestine without perforation or abscess without bleeding: Secondary | ICD-10-CM | POA: Insufficient documentation

## 2021-11-10 DIAGNOSIS — F32A Depression, unspecified: Secondary | ICD-10-CM | POA: Diagnosis not present

## 2021-11-10 HISTORY — PX: COLONOSCOPY WITH PROPOFOL: SHX5780

## 2021-11-10 SURGERY — COLONOSCOPY WITH PROPOFOL
Anesthesia: General

## 2021-11-10 MED ORDER — PROPOFOL 500 MG/50ML IV EMUL
INTRAVENOUS | Status: DC | PRN
  Administered 2021-11-10: 160 ug/kg/min via INTRAVENOUS

## 2021-11-10 MED ORDER — EPHEDRINE SULFATE (PRESSORS) 50 MG/ML IJ SOLN
INTRAMUSCULAR | Status: DC | PRN
  Administered 2021-11-10: 10 mg via INTRAVENOUS

## 2021-11-10 MED ORDER — SODIUM CHLORIDE 0.9 % IV SOLN
INTRAVENOUS | Status: DC
  Administered 2021-11-10: 1000 mL via INTRAVENOUS

## 2021-11-10 MED ORDER — STERILE WATER FOR IRRIGATION IR SOLN
Status: DC | PRN
  Administered 2021-11-10: 60 mL

## 2021-11-10 MED ORDER — GLYCOPYRROLATE 0.2 MG/ML IJ SOLN
INTRAMUSCULAR | Status: AC
  Filled 2021-11-10: qty 1

## 2021-11-10 MED ORDER — PROPOFOL 500 MG/50ML IV EMUL
INTRAVENOUS | Status: AC
  Filled 2021-11-10: qty 50

## 2021-11-10 MED ORDER — EPHEDRINE 5 MG/ML INJ
INTRAVENOUS | Status: AC
  Filled 2021-11-10: qty 5

## 2021-11-10 MED ORDER — PROPOFOL 10 MG/ML IV BOLUS
INTRAVENOUS | Status: DC | PRN
  Administered 2021-11-10: 70 mg via INTRAVENOUS

## 2021-11-10 NOTE — Op Note (Signed)
[facility] ?Gastroenterology ?Patient Name: Bobby Little ?Procedure Date: 11/10/2021 10:54 AM ?MRN: [id number] ?Account #: [id number] ?Date of Birth: 05-29-1946 ?Admit Type: Outpatient ?Age: 76 ?Room: [facility] ENDO ROOM 4 ?Gender: Male ?Note Status: Finalized ?Instrument Name: Colonoscope [id number] ?Procedure:             Colonoscopy ?Indications:           Screening for colorectal malignant neoplasm ?Providers:             [name] MD, MD ?Referring MD:          Dionne Bucy. Bacigalupo (Referring MD) ?Medicines:             Monitored Anesthesia Care ?Complications:         No immediate complications. ?Procedure:             Pre-Anesthesia Assessment: ?                       - Prior to the procedure, a History and Physical was  ?                       performed, and patient medications, allergies and  ?                       sensitivities were reviewed. The patient's tolerance  ?                       of previous anesthesia was reviewed. ?                       - The risks and benefits of the procedure and the  ?                       sedation options and risks were discussed with the  ?                       patient. All questions were answered and informed  ?                       consent was obtained. ?                       - ASA Grade Assessment: II - A patient with mild  ?                       systemic disease. ?                       After obtaining informed consent, the colonoscope was  ?                       passed under direct vision. Throughout the procedure,  ?                       the patient's blood pressure, pulse, and oxygen  ?                       saturations were monitored continuously. The  ?                       Colonoscope was introduced  through the anus and  ?                       advanced to the the cecum, identified by the  ?                       appendiceal orifice. The colonoscopy was performed  ?                       with ease. The patient tolerated the procedure well.   ?                       The quality of the bowel preparation was adequate. ?Findings: ?     The perianal and digital rectal examinations were normal. ?     Multiple small-mouthed diverticula were found in the sigmoid colon. ?     A 7 mm polyp was found in the ascending colon. The polyp was sessile.  ?     The polyp was removed with a cold snare. Resection and retrieval were  ?     complete. ?     The exam was otherwise without abnormality on direct and retroflexion  ?     views. ?Impression:            - Diverticulosis in the sigmoid colon. ?                       - One 7 mm polyp in the ascending colon, removed with  ?                       a cold snare. Resected and retrieved. ?                       - The examination was otherwise normal on direct and  ?                       retroflexion views. ?Recommendation:        - Discharge patient to home (with escort). ?                       - Resume previous diet. ?                       - Continue present medications. ?                       - Await pathology results. ?                       - Repeat colonoscopy for surveillance based on  ?                       pathology results. ?Procedure Code(s):     --- Professional --- ?                       [phone], Colonoscopy, flexible; with removal of  ?                       tumor(s), polyp(s), or other lesion(s) by snare  ?  technique ?Diagnosis Code(s):     --- Professional --- ?                       Z12.11, Encounter for screening for malignant neoplasm  ?                       of colon ?                       K63.5, Polyp of colon ?                       K57.30, Diverticulosis of large intestine without  ?                       perforation or abscess without bleeding ?CPT copyright 2019 American Medical Association. All rights reserved. ?The codes documented in this report are preliminary and upon coder review may  ?be revised to meet current compliance requirements. ?[name], MD ?[name] MD,  MD ?11/10/2021 11:15:23 AM ?This report has been signed electronically. ?Number of Addenda: 0 ?Note Initiated On: 11/10/2021 10:54 AM ?Scope Withdrawal Time: 0 hours 10 minutes 41 seconds  ?Total Procedure Duration: 0 hours 14 minutes 6 seconds  ?Estimated Blood Loss:  Estimated blood loss: none. ?     [facility] ?

## 2021-11-10 NOTE — Anesthesia Postprocedure Evaluation (Signed)
Anesthesia Post Note ? ?Patient: Bobby Little ? ?Procedure(s) Performed: COLONOSCOPY WITH PROPOFOL ? ?Patient location during evaluation: Endoscopy ?Anesthesia Type: General ?Level of consciousness: awake and alert ?Pain management: pain level controlled ?Vital Signs Assessment: post-procedure vital signs reviewed and stable ?Respiratory status: spontaneous breathing, nonlabored ventilation, respiratory function stable and patient connected to nasal cannula oxygen ?Cardiovascular status: blood pressure returned to baseline and stable ?Postop Assessment: no apparent nausea or vomiting ?Anesthetic complications: no ? ? ?No notable events documented. ? ? ?Last Vitals:  ?Vitals:  ? 11/10/21 1126 11/10/21 1136  ?BP: 114/74 135/80  ?Pulse: 67 64  ?Resp: 18 15  ?Temp:    ?SpO2: 100% 100%  ?  ?Last Pain:  ?Vitals:  ? 11/10/21 1136  ?TempSrc:   ?PainSc: 0-No pain  ? ? ?  ?  ?  ?  ?  ?  ? ?Martha Clan ? ? ? ? ?

## 2021-11-10 NOTE — Transfer of Care (Signed)
Immediate Anesthesia Transfer of Care Note ? ?Patient: Bobby Little ? ?Procedure(s) Performed: COLONOSCOPY WITH PROPOFOL ? ?Patient Location: PACU ? ?Anesthesia Type:General ? ?Level of Consciousness: drowsy ? ?Airway & Oxygen Therapy: Patient Spontanous Breathing ? ?Post-op Assessment: Report given to RN and Post -op Vital signs reviewed and stable ? ?Post vital signs: Reviewed and stable ? ?Last Vitals:  ?Vitals Value Taken Time  ?BP 105/69 11/10/21 1116  ?Temp 35.8 ?C 11/10/21 1116  ?Pulse 68 11/10/21 1117  ?Resp 13 11/10/21 1117  ?SpO2 100 % 11/10/21 1117  ?Vitals shown include unvalidated device data. ? ?Last Pain:  ?Vitals:  ? 11/10/21 1116  ?TempSrc: Temporal  ?PainSc:   ?   ? ?  ? ?Complications: No notable events documented. ?

## 2021-11-10 NOTE — H&P (Signed)
? ? ? ?Bobby Bellows, MD ?[address] [place], [place], [place], [address] ?[address], [place], [place], [place], [address] ?Phone: [phone]  ?Fax: [phone] ? ?Primary Care Physician:  Bobby Crews, MD ? ? ?Pre-Procedure History & Physical: ?HPI:  Bobby Little is a 76 y.o. male is here for an colonoscopy. ?  ?Past Medical History:  ?Diagnosis Date  ? Anxiety   ? x 20 y per pt  ? Arthritis   ? Degenerative disc disease, lumbar   ? Depression   ? Diverticulitis   ? Family history of polyps in the colon   ? Gastrointestinal stromal tumor (GIST) ([place])   ? 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 Hay fever/allergies   ? Hepatic cyst   ? History of spinal fusion for scoliosis   ? Hypertension   ? Polycystic kidney   ? Prediabetes   ? Spinal stenosis of lumbar region   ? ? ?Past Surgical History:  ?Procedure Laterality Date  ? COLONOSCOPY WITH PROPOFOL N/A 11/22/2016  ? Procedure: COLONOSCOPY WITH PROPOFOL;  Surgeon: Bobby Bellows, MD;  Location: [facility] ENDOSCOPY;  Service: Endoscopy;  Laterality: N/A;  ? KNEE ARTHROCENTESIS Right   ? PANCREAS SURGERY  10/2013  ? PARTIAL GASTRECTOMY    ? REMOVAL OF GASTROINTESTINAL STOMATIC  TUMOR OF STOMACH  10/2013  ? 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 TUMOR REMOVAL    ? stomach, slpeen, and pancreas  ? ? ?Prior to Admission medications   ?Medication Sig Start Date End Date Taking? Authorizing Provider  ?acetaminophen (TYLENOL) 500 MG tablet Take 500 mg by mouth.   Yes [provider]  ?atorvastatin (LIPITOR) 40 MG tablet Take 1 tablet (40 mg total) by mouth daily. 10/18/21  Yes Bacigalupo, Dionne Bucy, MD  ?clonazePAM (KLONOPIN) 1 MG tablet Take 1 mg by mouth 2 (two) times daily.   Yes [provider]  ?olmesartan (BENICAR) 40 MG tablet Take 1 tablet (40 mg total) by mouth daily. 10/18/21  Yes Bacigalupo, Dionne Bucy, MD  ? ? ?Allergies as of 09/06/2021 - Review Complete 09/06/2021   ?Allergen Reaction Noted  ? Beta adrenergic blockers  10/10/2016  ? Codeine Nausea And Vomiting   ? Trazodone and nefazodone Other (See Comments) 04/04/2017  ? ? ?Family History  ?Problem Relation Age of Onset  ? Hypertension Mother   ? Lymphoma Mother   ? Hypertension Father   ? Valvular heart disease Father   ? Dementia Father   ? Stroke Maternal Grandfather   ? Prostate cancer Paternal Grandfather   ? Coronary artery disease Sister   ? Coronary artery disease Brother   ? Colon cancer Neg Hx   ? ? ?Social History  ? ?Socioeconomic History  ? Marital status: Married  ?  Spouse name: Not on file  ? Number of children: 2  ? Years of education: Not on file  ? Highest education level: Not on file  ?Occupational History  ? Occupation: retired  ?Tobacco Use  ? Smoking status: Some Days  ?  Types: Cigars  ? Smokeless tobacco: Never  ? Tobacco comments:  ?  1 cigar 3-4 times year  ?Vaping Use  ? Vaping Use: Never used  ?Substance and Sexual Activity  ? Alcohol use: No  ? Drug use: No  ? Sexual activity: Yes  ?  Partners: Female  ?Other Topics Concern  ? Not on file  ?Social History Narrative  ?  Not on file  ? ?Social Determinants of Health  ? ?Financial Resource Strain: Not on file  ?Food Insecurity: Not on file  ?Transportation Needs: Not on file  ?Physical Activity: Not on file  ?Stress: Not on file  ?Social Connections: Not on file  ?Intimate Partner Violence: Not on file  ? ? ?Review of Systems: ?See HPI, otherwise negative ROS ? ?Physical Exam: ?BP (!) 141/89   Pulse 63   Temp (!) 96.7 ?F (35.9 ?C) (Temporal)   Resp 18   Ht 6' (1.829 m)   Wt 81.8 kg   SpO2 100%   BMI 24.46 kg/m?  ?General:   Alert,  pleasant and cooperative in NAD ?Head:  Normocephalic and atraumatic. ?Neck:  Supple; no masses or thyromegaly. ?Lungs:  Clear throughout to auscultation, normal respiratory effort.    ?Heart:  +S1, +S2, Regular rate and rhythm, No edema. ?Abdomen:  Soft, nontender and nondistended. Normal bowel sounds, without  guarding, and without rebound.   ?Neurologic:  Alert and  oriented x4;  grossly normal neurologically. ? ?Impression/Plan: ?[name] is here for an colonoscopy to be performed for Screening colonoscopy average risk   ?Risks, benefits, limitations, and alternatives regarding  colonoscopy have been reviewed with the patient.  Questions have been answered.  All parties agreeable. ? ? ?Bobby Bellows, MD  11/10/2021, 10:49 AM ? ?

## 2021-11-10 NOTE — Anesthesia Procedure Notes (Signed)
Date/Time: 11/10/2021 10:56 AM ?Performed by: Demetrius Charity, CRNA ?Pre-anesthesia Checklist: Patient identified, Emergency Drugs available, Suction available, Patient being monitored and Timeout performed ?Patient Re-evaluated:Patient Re-evaluated prior to induction ?Oxygen Delivery Method: Nasal cannula ?Induction Type: IV induction ?Placement Confirmation: CO2 detector and positive ETCO2 ? ? ? ? ?

## 2021-11-10 NOTE — Anesthesia Preprocedure Evaluation (Signed)
Anesthesia Evaluation  ?Patient identified by MRN, date of birth, ID band ?Patient awake ? ? ? ?Reviewed: ?Allergy & Precautions, H&P , NPO status , Patient's Chart, lab work & pertinent test results, reviewed documented beta blocker date and time  ? ?History of Anesthesia Complications ?Negative for: history of anesthetic complications ? ?Airway ?Mallampati: II ? ?TM Distance: >3 FB ?Neck ROM: full ? ? ? Dental ? ?(+) Caps, Partial Lower, Dental Advidsory Given, Teeth Intact ?  ?Pulmonary ?neg shortness of breath, neg sleep apnea, neg COPD, neg recent URI, Current Smoker,  ?  ? ? ? ? ? ? ? Cardiovascular ?Exercise Tolerance: Good ?hypertension, (-) angina(-) CAD, (-) Past MI and (-) Cardiac Stents (-) dysrhythmias (-) Valvular Problems/Murmurs ? ? ?  ?Neuro/Psych ?PSYCHIATRIC DISORDERS (Depression) negative neurological ROS ?   ? GI/Hepatic ?negative GI ROS, Neg liver ROS,   ?Endo/Other  ?negative endocrine ROS ? Renal/GU ?Renal disease  ?negative genitourinary ?  ?Musculoskeletal ? ? Abdominal ?  ?Peds ? Hematology ?negative hematology ROS ?(+)   ?Anesthesia Other Findings ?Past Medical History: ?No date: Arthritis ?No date: Depression ?No date: Diverticulitis ?No date: Family history of polyps in the colon ?No date: Hay fever/allergies ?No date: History of spinal fusion for scoliosis ?No date: Hypertension ?No date: Polycystic kidney ? ? Reproductive/Obstetrics ?negative OB ROS ? ?  ? ? ? ? ? ? ? ? ? ? ? ? ? ?  ?  ? ? ? ? ? ? ? ? ?Anesthesia Physical ? ?Anesthesia Plan ? ?ASA: II ? ?Anesthesia Plan: General  ? ?Post-op Pain Management:   ? ?Induction:  ? ?PONV Risk Score and Plan:  ? ?Airway Management Planned:  ? ?Additional Equipment:  ? ?Intra-op Plan:  ? ?Post-operative Plan:  ? ?Informed Consent: I have reviewed the patients History and Physical, chart, labs and discussed the procedure including the risks, benefits and alternatives for the proposed anesthesia with the  patient or authorized representative who has indicated his/her understanding and acceptance.  ? ? ? ?Dental Advisory Given ? ?Plan Discussed with: Anesthesiologist, CRNA and Surgeon ? ?Anesthesia Plan Comments:   ? ? ? ? ? ? ?Anesthesia Quick Evaluation ? ?

## 2021-11-13 ENCOUNTER — Encounter: Payer: Self-pay | Admitting: Gastroenterology

## 2021-11-13 LAB — SURGICAL PATHOLOGY

## 2021-11-14 ENCOUNTER — Encounter: Payer: Self-pay | Admitting: Gastroenterology

## 2021-11-30 ENCOUNTER — Encounter: Payer: Self-pay | Admitting: Family Medicine

## 2021-11-30 DIAGNOSIS — N433 Hydrocele, unspecified: Secondary | ICD-10-CM

## 2021-12-01 DIAGNOSIS — N433 Hydrocele, unspecified: Secondary | ICD-10-CM | POA: Insufficient documentation

## 2021-12-20 ENCOUNTER — Ambulatory Visit (INDEPENDENT_AMBULATORY_CARE_PROVIDER_SITE_OTHER): Payer: Medicare Other | Admitting: Urology

## 2021-12-20 ENCOUNTER — Encounter: Payer: Self-pay | Admitting: Urology

## 2021-12-20 VITALS — BP 136/79 | HR 75 | Ht 72.0 in | Wt 180.0 lb

## 2021-12-20 DIAGNOSIS — N433 Hydrocele, unspecified: Secondary | ICD-10-CM | POA: Diagnosis not present

## 2021-12-20 LAB — URINALYSIS, COMPLETE
Bilirubin, UA: NEGATIVE
Glucose, UA: NEGATIVE
Ketones, UA: NEGATIVE
Nitrite, UA: NEGATIVE
Protein,UA: NEGATIVE
RBC, UA: NEGATIVE
Specific Gravity, UA: 1.015 (ref 1.005–1.030)
Urobilinogen, Ur: 0.2 mg/dL (ref 0.2–1.0)
pH, UA: 6.5 (ref 5.0–7.5)

## 2021-12-20 LAB — MICROSCOPIC EXAMINATION

## 2021-12-20 NOTE — Progress Notes (Signed)
12/20/2021 10:49 AM   Bobby Little 03-11-1946 [id number]  Referring provider: Birdie Sons, MD [address] [place],   [address]  Chief Complaint  Patient presents with   Hydrocele    HPI: Bobby Little is a 76 y.o. male who presents for evaluation of a hydrocele.  Was seen 12/23/2017 for erectile dysfunction which was treated with tadalafil Presents today with a several month history of left hemiscrotal swelling Saw Dr. 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[place])    GIST (gastrointestinal stroma tumor), malignant, 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[place])    Hay fever/allergies    Hepatic cyst    History of spinal fusion for scoliosis    Hypertension    Polycystic kidney    Prediabetes    Spinal stenosis of lumbar region     Surgical History: Past Surgical History:  Procedure Laterality Date   COLONOSCOPY WITH PROPOFOL N/A 11/22/2016   Procedure: COLONOSCOPY WITH PROPOFOL;  Surgeon: [name], MD;  Location: [facility] ENDOSCOPY;  Service: Endoscopy;  Laterality: N/A;   COLONOSCOPY WITH PROPOFOL N/A 11/10/2021   Procedure: COLONOSCOPY WITH PROPOFOL;  Surgeon: [name], MD;  Location: [facility] ENDOSCOPY;  Service: Gastroenterology;  Laterality: N/A;   KNEE ARTHROCENTESIS Right    PANCREAS SURGERY  10/2013   PARTIAL GASTRECTOMY     REMOVAL OF GASTROINTESTINAL STOMATIC  TUMOR OF STOMACH  10/2013   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 If you have any questions, ask your nurse or doctor.          acetaminophen 500 MG tablet Commonly known as: TYLENOL Take 500 mg by mouth.   atorvastatin 40 MG tablet Commonly known as: LIPITOR Take 1 tablet (40 mg total) by mouth daily.   clonazePAM 1 MG tablet Commonly known as: KLONOPIN Take 1 mg by mouth 2 (two) times daily.   olmesartan 40 MG tablet Commonly known as: BENICAR Take 1 tablet (40 mg total) by mouth daily.        Allergies:  Allergies  Allergen Reactions   Beta Adrenergic Blockers     Dizziness, low heart rate   Codeine Nausea And Vomiting   Trazodone And Nefazodone Other (See Comments)    insomnia    Family History: Family History  Problem Relation Age of Onset   Hypertension Mother    Lymphoma Mother    Hypertension Father    Valvular heart disease Father    Dementia Father    Stroke Maternal Grandfather    Prostate cancer Paternal Grandfather  Coronary artery disease Sister    Coronary artery disease Brother    Colon cancer Neg Hx     Social History:  reports that he has been smoking cigars. He has never used smokeless tobacco. He reports that he does not drink alcohol and does not use drugs.   Physical Exam: BP 136/79   Pulse 75   Ht 6' (1.829 m)   Wt 180 lb (81.6 kg)   BMI 24.41 kg/m   Constitutional:  Alert, No acute distress. HEENT: [place] AT Respiratory: Normal respiratory effort, no increased work of breathing. GI: Abdomen is soft, nontender, nondistended, no abdominal masses GU: Moderate left hydrocele.  Palpably normal right testis.  Left testis not palpable.  No evidence hernia Skin: No rashes, bruises or suspicious lesions. Neurologic: Grossly intact, no  focal deficits, moving all 4 extremities. Psychiatric: Normal mood and affect.   Pertinent Imaging: Scrotal ultrasound images were personally reviewed and interpreted   Assessment & Plan:    1.  Left hydrocele Mild to moderate symptoms Management options were discussed including observation, aspiration and hydrocelectomy.  We did discuss the high recurrence rate of aspiration.  He would initially prefer an office aspiration.  The low complication rate of bleeding and infection were discussed and will schedule in office under ultrasound guidance   Abbie Sons, MD  [place] [address] [place], [place] [address] [phone]

## 2021-12-22 ENCOUNTER — Ambulatory Visit (INDEPENDENT_AMBULATORY_CARE_PROVIDER_SITE_OTHER): Payer: Medicare Other | Admitting: Urology

## 2021-12-22 ENCOUNTER — Encounter: Payer: Self-pay | Admitting: Urology

## 2021-12-22 VITALS — BP 129/76 | HR 79 | Ht 71.0 in | Wt 177.0 lb

## 2021-12-22 DIAGNOSIS — N433 Hydrocele, unspecified: Secondary | ICD-10-CM | POA: Diagnosis not present

## 2021-12-24 NOTE — Progress Notes (Signed)
Procedure note  Diagnosis: Left hydrocele  Procedure: Hydrocele aspiration  Indications: 76 y.o. male with a symptomatic left hydrocele.  After discussing options initially requested hydrocele aspiration  Anesthesia: 1% plain Xylocaine  Description: Patient was placed in the supine position and scrotal sonogram was performed which showed the testis well away from the site of planned needle placement.  He was then prepped and draped in the usual fashion.  The anterior scrotal skin at the site of planned needle placement was anesthetized with 1 cc of 1% plain Xylocaine.  An 21 gauge Angiocath was placed and the needle was removed.  150 mL of straw-colored fluid was then aspirated.  At the completion procedure note hydrocele fluid remained.  Complications: None  Plan: The possibility of fluid reaccumulation was discussed and he will follow-up as needed   John Giovanni, MD

## 2022-01-04 ENCOUNTER — Encounter: Payer: Self-pay | Admitting: Urology

## 2022-01-09 ENCOUNTER — Encounter: Payer: Self-pay | Admitting: Family Medicine

## 2022-01-11 NOTE — Progress Notes (Unsigned)
Established patient visit   Patient: Bobby Little   DOB: 03/02/1946   75 y.o. Male  MRN: [id number] Visit Date: 01/12/2022  Today's healthcare provider: Angela Bacigalupo, MD   No chief complaint on file.  Subjective    HPI  Hypertension, follow-up  BP Readings from Last 3 Encounters:  12/22/21 129/76  12/20/21 136/79  11/10/21 135/80   Wt Readings from Last 3 Encounters:  12/22/21 177 lb (80.3 kg)  12/20/21 180 lb (81.6 kg)  11/10/21 180 lb 5.7 oz (81.8 kg)     He was last seen for hypertension 9 months ago.  BP at that visit was 115/68. Management since that visit includes Will defer medication changes at this time, but will reassess at next visit - Encouraged pt. to continue checking BP at home.  He reports {excellent/good/fair/poor:19665} compliance with treatment. He {is/is not:9024} having side effects. {document side effects if present:1} He is following a {diet:21022986} diet. He {is/is not:9024} exercising. He {does/does not:200015} smoke.  Outside blood pressures are {***enter patient reported home BP readings, or 'not being checked':1}. Symptoms: {Yes/No:20286} chest pain {Yes/No:20286} chest pressure  {Yes/No:20286} palpitations {Yes/No:20286} syncope  {Yes/No:20286} dyspnea {Yes/No:20286} orthopnea  {Yes/No:20286} paroxysmal nocturnal dyspnea {Yes/No:20286} lower extremity edema   Pertinent labs Lab Results  Component Value Date   CHOL 162 05/20/2020   HDL 47 05/20/2020   LDLCALC 94 05/20/2020   TRIG 116 05/20/2020   CHOLHDL 3.4 05/20/2020   Lab Results  Component Value Date   NA 142 04/03/2021   K 4.7 04/03/2021   CREATININE 1.03 04/03/2021   EGFR 76 04/03/2021   GLUCOSE 99 04/03/2021   TSH 3.140 04/03/2021     The 10-year ASCVD risk score (Arnett DK, et al., 2019) is: 49.2%  ---------------------------------------------------------------------------------------------------   Medications: Outpatient Medications Prior to Visit   Medication Sig   acetaminophen (TYLENOL) 500 MG tablet Take 500 mg by mouth.   atorvastatin (LIPITOR) 40 MG tablet Take 1 tablet (40 mg total) by mouth daily.   clonazePAM (KLONOPIN) 1 MG tablet Take 1 mg by mouth 2 (two) times daily.   olmesartan (BENICAR) 40 MG tablet Take 1 tablet (40 mg total) by mouth daily.   No facility-administered medications prior to visit.    Review of Systems  {Labs  Heme  Chem  Endocrine  Serology  Results Review (optional):23779}   Objective    There were no vitals taken for this visit. {Show previous vital signs (optional):23777}  Physical Exam  ***  No results found for any visits on 01/12/22.  Assessment & Plan     ***  No follow-ups on file.      {provider attestation***:1}   Angela Bacigalupo, MD  [facility] [phone] (phone) [phone] (fax)  [facility]  

## 2022-01-11 NOTE — Telephone Encounter (Signed)
Noted  

## 2022-01-12 ENCOUNTER — Ambulatory Visit (INDEPENDENT_AMBULATORY_CARE_PROVIDER_SITE_OTHER): Payer: Medicare Other | Admitting: Family Medicine

## 2022-01-12 ENCOUNTER — Encounter: Payer: Self-pay | Admitting: Family Medicine

## 2022-01-12 VITALS — BP 127/78 | HR 57 | Temp 97.8°F | Resp 16 | Ht 72.0 in | Wt 182.0 lb

## 2022-01-12 DIAGNOSIS — Z0000 Encounter for general adult medical examination without abnormal findings: Secondary | ICD-10-CM | POA: Diagnosis not present

## 2022-01-12 DIAGNOSIS — R7303 Prediabetes: Secondary | ICD-10-CM

## 2022-01-12 DIAGNOSIS — Z125 Encounter for screening for malignant neoplasm of prostate: Secondary | ICD-10-CM

## 2022-01-12 DIAGNOSIS — E7800 Pure hypercholesterolemia, unspecified: Secondary | ICD-10-CM | POA: Diagnosis not present

## 2022-01-12 DIAGNOSIS — I10 Essential (primary) hypertension: Secondary | ICD-10-CM

## 2022-01-12 DIAGNOSIS — C49A2 Gastrointestinal stromal tumor of stomach: Secondary | ICD-10-CM

## 2022-01-12 NOTE — Assessment & Plan Note (Signed)
Well controlled Continue current medications Recheck metabolic panel F/u in 6 months  

## 2022-01-12 NOTE — Assessment & Plan Note (Signed)
Previously well controlled Continue statin Repeat FLP and CMP  

## 2022-01-13 LAB — HEMOGLOBIN A1C
Est. average glucose Bld gHb Est-mCnc: 111 mg/dL
Hgb A1c MFr Bld: 5.5 % (ref 4.8–5.6)

## 2022-01-13 LAB — LIPID PANEL
Chol/HDL Ratio: 2.6 ratio (ref 0.0–5.0)
Cholesterol, Total: 156 mg/dL (ref 100–199)
HDL: 61 mg/dL
LDL Chol Calc (NIH): 81 mg/dL (ref 0–99)
Triglycerides: 69 mg/dL (ref 0–149)
VLDL Cholesterol Cal: 14 mg/dL (ref 5–40)

## 2022-01-13 LAB — PSA TOTAL (REFLEX TO FREE): Prostate Specific Ag, Serum: 1 ng/mL (ref 0.0–4.0)

## 2022-01-13 LAB — COMPREHENSIVE METABOLIC PANEL
ALT: 20 IU/L (ref 0–44)
AST: 22 IU/L (ref 0–40)
Albumin/Globulin Ratio: 2 (ref 1.2–2.2)
Albumin: 4.5 g/dL (ref 3.7–4.7)
Alkaline Phosphatase: 99 IU/L (ref 44–121)
BUN/Creatinine Ratio: 16 (ref 10–24)
BUN: 15 mg/dL (ref 8–27)
Bilirubin Total: 0.5 mg/dL (ref 0.0–1.2)
CO2: 20 mmol/L (ref 20–29)
Calcium: 9.2 mg/dL (ref 8.6–10.2)
Chloride: 100 mmol/L (ref 96–106)
Creatinine, Ser: 0.95 mg/dL (ref 0.76–1.27)
Globulin, Total: 2.3 g/dL (ref 1.5–4.5)
Glucose: 92 mg/dL (ref 70–99)
Potassium: 4.7 mmol/L (ref 3.5–5.2)
Sodium: 137 mmol/L (ref 134–144)
Total Protein: 6.8 g/dL (ref 6.0–8.5)
eGFR: 83 mL/min/{1.73_m2} (ref >59)

## 2022-01-22 ENCOUNTER — Encounter: Payer: Self-pay | Admitting: Family Medicine

## 2022-01-23 ENCOUNTER — Encounter: Payer: Self-pay | Admitting: Family Medicine

## 2022-02-05 ENCOUNTER — Ambulatory Visit: Payer: Self-pay

## 2022-02-05 ENCOUNTER — Ambulatory Visit: Payer: Medicare Other

## 2022-02-12 ENCOUNTER — Ambulatory Visit: Payer: Medicare Other | Admitting: Family Medicine

## 2022-02-27 ENCOUNTER — Encounter: Payer: Self-pay | Admitting: Student in an Organized Health Care Education/Training Program

## 2022-02-27 ENCOUNTER — Ambulatory Visit
Payer: Medicare Other | Attending: Student in an Organized Health Care Education/Training Program | Admitting: Student in an Organized Health Care Education/Training Program

## 2022-02-27 VITALS — BP 124/77 | HR 62 | Temp 97.1°F | Ht 72.0 in | Wt 182.0 lb

## 2022-02-27 DIAGNOSIS — G894 Chronic pain syndrome: Secondary | ICD-10-CM | POA: Diagnosis present

## 2022-02-27 DIAGNOSIS — G8929 Other chronic pain: Secondary | ICD-10-CM | POA: Diagnosis present

## 2022-02-27 DIAGNOSIS — G5703 Lesion of sciatic nerve, bilateral lower limbs: Secondary | ICD-10-CM

## 2022-02-27 DIAGNOSIS — M533 Sacrococcygeal disorders, not elsewhere classified: Secondary | ICD-10-CM | POA: Diagnosis present

## 2022-02-27 DIAGNOSIS — M47818 Spondylosis without myelopathy or radiculopathy, sacral and sacrococcygeal region: Secondary | ICD-10-CM | POA: Diagnosis present

## 2022-02-27 NOTE — Progress Notes (Signed)
Safety precautions to be maintained throughout the outpatient stay will include: orient to surroundings, keep bed in low position, maintain call bell within reach at all times, provide assistance with transfer out of bed and ambulation.  

## 2022-02-27 NOTE — Progress Notes (Signed)
PROVIDER NOTE: Information contained herein reflects review and annotations entered in association with encounter. Interpretation of such information and data should be left to medically-trained personnel. Information provided to patient can be located elsewhere in the medical record under "Patient Instructions". Document created using STT-dictation technology, any transcriptional errors that may result from process are unintentional.    Patient: Bobby Little  Service Category: E/M  Provider: Gillis Santa, MD  DOB: [birth year]  DOS: 02/27/2022  Referring Provider: Virginia Crews, MD  MRN: [id number]  Specialty: Interventional Pain Management  PCP: Virginia Crews, MD  Type: Established Patient  Setting: Ambulatory outpatient    Location: Office  Delivery: Face-to-face     HPI  Bobby Little, a 76 y.o. 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 Mr. Hoos primary complain today is buttock pain that radiates into his groin Pain Assessment: Severity of Chronic pain is reported as a 10-Worst pain ever/10. Location: Hip Left, Right/pain radiaties everywhere. Onset: More than a month ago. Quality: Aching, Burning, Constant, Throbbing, Nagging. Timing: Constant. Modifying factor(s): Meds,heating pad, back support. Vitals:  height is 6' (1.829 m) and weight is 182 lb (82.6 kg). His temperature is 97.1 F (36.2 C) (abnormal). His blood pressure is 124/77 and his pulse is 62. His oxygen saturation is 99%.   Reason for encounter: new problems.   Bobby Little presents today with increased bilateral buttock pain, hip pain with radiation into his groin at times.  He finds it difficult to ambulate without having to sit down and rest.  He states that his pain is about the same whether he is sitting or standing. 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  His physical exam findings below are consistent with SI joint  arthropathy piriformis syndrome and we discussed diagnostic SI joint injection and piriformis trigger point injection.  We will plan on doing this under fluoroscopy with contrast.  Risks and benefits reviewed and patient would like to proceed.  ROS  Constitutional: Denies any fever or chills Gastrointestinal: No reported hemesis, hematochezia, vomiting, or acute GI distress Musculoskeletal:  Buttock pain, hip pain, bilateral groin pain left greater than right Neurological: No reported episodes of acute onset apraxia, aphasia, dysarthria, agnosia, amnesia, paralysis, loss of coordination, or loss of consciousness  Medication Review  acetaminophen, atorvastatin, clonazePAM, naproxen sodium, and olmesartan  History Review  Allergy: Mr. Kerschner is allergic to beta adrenergic blockers, codeine, and trazodone and nefazodone. Drug: Mr. Frisbie  reports no history of drug use. Alcohol:  reports no history of alcohol use. Tobacco:  reports that he has been smoking cigars. He has never used smokeless tobacco. Social: Mr. Perkins  reports that he has been smoking cigars. He has never used smokeless tobacco. He reports that he does not drink alcohol and does not use drugs. 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 Surgical: Mr. 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 Family: family history includes Coronary artery disease in his brother and sister; Dementia in his father; Hypertension in his father and mother; Lymphoma in his mother; Prostate cancer in his paternal grandfather; Stroke in his maternal grandfather; Valvular heart disease in his father.  Laboratory Chemistry Profile   Renal Lab Results  Component Value Date   BUN 15 01/12/2022   CREATININE 0.95 01/12/2022   BCR 16 01/12/2022   GFRAA 85 05/20/2020   GFRNONAA 74 05/20/2020    Hepatic Lab Results  Component Value Date   AST 22 01/12/2022   ALT 20 01/12/2022   ALBUMIN 4.5 01/12/2022   ALKPHOS 99 01/12/2022    Electrolytes Lab Results  Component Value Date   NA 137 01/12/2022   K 4.7 01/12/2022   CL 100 01/12/2022   CALCIUM 9.2 01/12/2022    Bone No results found for: "VD25OH", "VD125OH2TOT", "RW4315QM0", "QQ7619JK9", "25OHVITD1", "25OHVITD2", "25OHVITD3", "TESTOFREE", "TESTOSTERONE"  Inflammation (CRP: Acute Phase) (ESR: Chronic Phase) No results found for: "CRP", "ESRSEDRATE", "LATICACIDVEN"       Note: Above Lab results reviewed.  Recent Imaging Review  US SCROTUM W/DOPPLER CLINICAL DATA:  Testicular swelling, left greater than right for 1 week. Left groin pain.  EXAM: SCROTAL ULTRASOUND  DOPPLER ULTRASOUND OF THE TESTICLES  TECHNIQUE: Complete ultrasound examination of the testicles, epididymis, and other scrotal structures was performed. Color and spectral Doppler ultrasound were also utilized to evaluate blood flow to the testicles.  COMPARISON:  None.  FINDINGS: Right testicle  Measurements: 5.4 x 2.3 x 3.1. No mass or microlithiasis visualized.  Left testicle  Measurements: 5.6 x 2.7 x 3.3. No mass or microlithiasis visualized. Small cystic structure with peripheral calcification measuring 0.7 x 0.5 x 0.4 cm in the lower pole.  Right epididymis:  Normal in size and  appearance.  Left epididymis:  Normal in size and appearance.  Hydrocele: Small right and large left hydrocele with echogenic debris.  Varicocele:  None visualized.  Pulsed Doppler interrogation of both testes demonstrates normal low resistance arterial and venous waveforms bilaterally.  IMPRESSION: 1. No evidence of orchitis or epididymitis. No appreciable testicular mass. Small cyst in the left testis measuring up to 0.7 cm.  2. Large left and small right hydrocele with echogenic debris, which may be sequela of prior infectious/inflammatory process.  Electronically Signed   By: Keane Police D.O.   On: 08/09/2021 15:53 Note: Reviewed        Physical Exam  General appearance: Well nourished, well developed, and well hydrated. In no apparent acute distress Mental status: Alert, oriented x 3 (person, place, & time)       Respiratory: No evidence of acute respiratory distress Eyes: PERLA Vitals: BP 124/77   Pulse 62   Temp (!) 97.1 F (36.2 C)   Ht 6' (1.829 m)   Wt 182 lb (82.6 kg)   SpO2 99%   BMI 24.68 kg/m  BMI: Estimated body mass index is 24.68 kg/m as calculated from the following:   Height as of this encounter: 6' (1.829 m).   Weight as of this encounter: 182 lb (82.6 kg). Ideal: Ideal body weight: 77.6 kg (171 lb 1.2 oz) Adjusted ideal body weight: 79.6 kg (175 lb 7.1 oz)  Lumbar Spine Area Exam  Skin & Axial Inspection: No masses, redness, or swelling Alignment: Symmetrical Functional ROM: Pain restricted ROM       Stability: No instability detected Muscle Tone/Strength: Functionally intact. No obvious neuro-muscular anomalies detected. Sensory (Neurological): Musculoskeletal pain pattern Palpation: No  palpable anomalies       Provocative Tests:  Patrick's Maneuver: (+) for bilateral S-I arthralgia             FABER* test: (+) for bilateral S-I arthralgia             S-I anterior distraction/compression test: (+) for bilateral S-I arthralgia              S-I lateral compression test: (+) for bilateral S-I arthralgia             S-I Thigh-thrust test: (+) for bilateral S-I arthralgia             S-I Gaenslen's test: (+) for bilateral S-I arthralgia              Gait & Posture Assessment  Ambulation: Unassisted Gait: Relatively normal for age and body habitus Posture: WNL  Lower Extremity Exam    Side: Right lower extremity  Side: Left lower extremity  Stability: No instability observed          Stability: No instability observed          Skin & Extremity Inspection: Skin color, temperature, and hair growth are WNL. No peripheral edema or cyanosis. No masses, redness, swelling, asymmetry, or associated skin lesions. No contractures.  Skin & Extremity Inspection: Skin color, temperature, and hair growth are WNL. No peripheral edema or cyanosis. No masses, redness, swelling, asymmetry, or associated skin lesions. No contractures.  Functional ROM: Pain restricted ROM for hip and knee joints          Functional ROM: Pain restricted ROM for hip and knee joints          Muscle Tone/Strength: Functionally intact. No obvious neuro-muscular anomalies detected.  Muscle Tone/Strength: Functionally intact. No obvious neuro-muscular anomalies detected.  Sensory (Neurological): Musculoskeletal pain pattern        Sensory (Neurological): Musculoskeletal pain pattern        DTR: Patellar: deferred today Achilles: deferred today Plantar: deferred today  DTR: Patellar: deferred today Achilles: deferred today Plantar: deferred today  Palpation: No palpable anomalies  Palpation: No palpable anomalies    Assessment   Diagnosis Status  1. Chronic SI joint pain   2. SI joint arthritis   3. Piriformis syndrome of both sides   4. Chronic pain syndrome    Having a Flare-up Having a Flare-up Having a Flare-up   Updated Problems: Problem  Si Joint Arthritis  Piriformis Syndrome of Both Sides    Plan of Care   Orders:  Orders Placed This Encounter   Procedures   SACROILIAC JOINT INJECTION    Standing Status:   Future    Standing Expiration Date:   05/30/2022    Scheduling Instructions:     Side: Bilateral     Sedation: without     Timeframe: ASAP    Order Specific Question:   Where will this procedure be performed?    Answer:   [facility]   TRIGGER POINT INJECTION    Area: Buttocks region (gluteal area) Indications: Piriformis muscle pain; Bilateral (G57.03) piriformis-syndrome; piriformis muscle spasms (K34.917). CPT code: 20552    Standing Status:   Future    Standing Expiration Date:   02/28/2023    Scheduling Instructions:     Piriformis TPI    Order Specific Question:   Where will this procedure be performed?    Answer:   [facility]   Follow-up plan:   Return in about 1 week (around  03/06/2022) for B/L SI-J + Piriformis, in clinic NS.     IL L4/5 ESI #1 09/07/20- not helpful      Recent Visits No visits were found meeting these conditions. Showing recent visits within past 90 days and meeting all other requirements Today's Visits Date Type Provider Dept  02/27/22 Office Visit Gillis Santa, MD [facility]  Showing today's visits and meeting all other requirements Future Appointments Date Type Provider Dept  03/07/22 Appointment Gillis Santa, MD [facility]  Showing future appointments within next 90 days and meeting all other requirements  I discussed the assessment and treatment plan with the patient. The patient was provided an opportunity to ask questions and all were answered. The patient agreed with the plan and demonstrated an understanding of the instructions.  Patient advised to call back or seek an in-person evaluation if the symptoms or condition worsens.  Duration of encounter: 71mnutes.  Total time on encounter, as per AMA guidelines included both the face-to-face and non-face-to-face time personally spent by the physician and/or other qualified health care  professional(s) on the day of the encounter (includes time in activities that require the physician or other qualified health care professional and does not include time in activities normally performed by clinical staff). Physician's time may include the following activities when performed: preparing to see the patient (eg, review of tests, pre-charting review of records) obtaining and/or reviewing separately obtained history performing a medically appropriate examination and/or evaluation counseling and educating the patient/family/caregiver ordering medications, tests, or procedures referring and communicating with other health care professionals (when not separately reported) documenting clinical information in the electronic or other health record independently interpreting results (not separately reported) and communicating results to the patient/ family/caregiver care coordination (not separately reported)  Note by: BGillis Santa MD Date: 02/27/2022; Time: 10:35 AM

## 2022-03-07 ENCOUNTER — Ambulatory Visit
Admission: RE | Admit: 2022-03-07 | Discharge: 2022-03-07 | Disposition: A | Discharge status: Home / Self Care | Payer: Medicare Other | Source: Ambulatory Visit | Attending: Student in an Organized Health Care Education/Training Program | Admitting: Student in an Organized Health Care Education/Training Program

## 2022-03-07 ENCOUNTER — Encounter: Payer: Self-pay | Admitting: Student in an Organized Health Care Education/Training Program

## 2022-03-07 ENCOUNTER — Ambulatory Visit
Payer: Medicare Other | Attending: Student in an Organized Health Care Education/Training Program | Admitting: Student in an Organized Health Care Education/Training Program

## 2022-03-07 VITALS — BP 136/77 | HR 59 | Temp 97.7°F | Resp 16 | Ht 72.0 in | Wt 178.0 lb

## 2022-03-07 DIAGNOSIS — G894 Chronic pain syndrome: Secondary | ICD-10-CM | POA: Diagnosis not present

## 2022-03-07 DIAGNOSIS — M25552 Pain in left hip: Secondary | ICD-10-CM | POA: Diagnosis present

## 2022-03-07 DIAGNOSIS — G8929 Other chronic pain: Secondary | ICD-10-CM | POA: Diagnosis not present

## 2022-03-07 DIAGNOSIS — M47818 Spondylosis without myelopathy or radiculopathy, sacral and sacrococcygeal region: Secondary | ICD-10-CM | POA: Diagnosis not present

## 2022-03-07 DIAGNOSIS — M533 Sacrococcygeal disorders, not elsewhere classified: Secondary | ICD-10-CM | POA: Diagnosis not present

## 2022-03-07 DIAGNOSIS — G5700 Lesion of sciatic nerve, unspecified lower limb: Secondary | ICD-10-CM | POA: Insufficient documentation

## 2022-03-07 DIAGNOSIS — G5703 Lesion of sciatic nerve, bilateral lower limbs: Secondary | ICD-10-CM | POA: Diagnosis not present

## 2022-03-07 DIAGNOSIS — M461 Sacroiliitis, not elsewhere classified: Secondary | ICD-10-CM

## 2022-03-07 DIAGNOSIS — M25551 Pain in right hip: Secondary | ICD-10-CM | POA: Insufficient documentation

## 2022-03-07 MED ORDER — ROPIVACAINE HCL 2 MG/ML IJ SOLN
9.0000 mL | Freq: Once | INTRAMUSCULAR | Status: AC
  Administered 2022-03-07: 9 mL via INTRA_ARTICULAR

## 2022-03-07 MED ORDER — IOHEXOL 180 MG/ML  SOLN
10.0000 mL | Freq: Once | INTRAMUSCULAR | Status: AC
  Administered 2022-03-07: 10 mL via INTRA_ARTICULAR
  Filled 2022-03-07: qty 20

## 2022-03-07 MED ORDER — DEXAMETHASONE SODIUM PHOSPHATE 10 MG/ML IJ SOLN
INTRAMUSCULAR | Status: AC
  Filled 2022-03-07: qty 1

## 2022-03-07 MED ORDER — ROPIVACAINE HCL 2 MG/ML IJ SOLN
INTRAMUSCULAR | Status: AC
  Filled 2022-03-07: qty 20

## 2022-03-07 MED ORDER — LIDOCAINE HCL 2 % IJ SOLN
INTRAMUSCULAR | Status: AC
  Filled 2022-03-07: qty 20

## 2022-03-07 MED ORDER — METHYLPREDNISOLONE ACETATE 80 MG/ML IJ SUSP
80.0000 mg | Freq: Once | INTRAMUSCULAR | Status: AC
  Administered 2022-03-07: 80 mg

## 2022-03-07 MED ORDER — ROPIVACAINE HCL 2 MG/ML IJ SOLN
9.0000 mL | Freq: Once | INTRAMUSCULAR | Status: AC
  Administered 2022-03-07: 9 mL via PERINEURAL

## 2022-03-07 MED ORDER — METHYLPREDNISOLONE ACETATE 80 MG/ML IJ SUSP
INTRAMUSCULAR | Status: AC
  Filled 2022-03-07: qty 1

## 2022-03-07 MED ORDER — LIDOCAINE HCL 2 % IJ SOLN
20.0000 mL | Freq: Once | INTRAMUSCULAR | Status: AC
  Administered 2022-03-07: 400 mg

## 2022-03-07 MED ORDER — DEXAMETHASONE SODIUM PHOSPHATE 10 MG/ML IJ SOLN
10.0000 mg | Freq: Once | INTRAMUSCULAR | Status: AC
  Administered 2022-03-07: 10 mg

## 2022-03-07 NOTE — Progress Notes (Signed)
Safety precautions to be maintained throughout the outpatient stay will include: orient to surroundings, keep bed in low position, maintain call bell within reach at all times, provide assistance with transfer out of bed and ambulation.  

## 2022-03-07 NOTE — Progress Notes (Signed)
PROVIDER NOTE: Interpretation of information contained herein should be left to medically-trained personnel. Specific patient instructions are provided elsewhere under "Patient Instructions" section of medical record. This document was created in part using STT-dictation technology, any transcriptional errors that may result from this process are unintentional.  Patient: Bobby Little Type: Established DOB: 05-Nov-1945 MRN: [id number] PCP: Virginia Crews, MD  Service: Procedure DOS: 03/07/2022 Setting: Ambulatory Location: Ambulatory outpatient facility Delivery: Face-to-face Provider: Gillis Santa, MD Specialty: Interventional Pain Management Specialty designation: 09 Location: Outpatient facility Ref. Prov.: Bacigalupo, Dionne Bucy, MD    Primary Reason for Visit: Interventional Pain Management Treatment. CC: Hip Pain (bilat)   Procedure:               Type: Bilateral Sacroiliac Joint Steroid Injection  & bilateral piriformis trigger point injection     Level: PSIS (Posterior inferior iliac Spine)  Imaging: Fluoroscopic guidance Anesthesia: Local anesthesia (1-2% Lidocaine) DOS: 03/07/2022  Performed by: Gillis Santa, MD  Purpose: Diagnostic/Therapeutic Indications: Sacroiliac joint pain in the lower back and hip area severe enough to impact quality of life or function. Rationale (medical necessity): procedure needed and proper for the diagnosis and/or treatment of Bobby Little's medical symptoms and needs. 1. Chronic SI joint pain   2. SI joint arthritis   3. Piriformis syndrome of both sides   4. Chronic pain syndrome    NAS-11 Pain score:   Pre-procedure: 10-Worst pain ever/10   Post-procedure: 10-Worst pain ever/10     Target: Interarticular sacroiliac joint. Location: Medial to the postero-medial edge of iliac spine. Region: Lumbosacral-sacrococcygeal. Approach: Inferior postero-medial percutaneous approach. Type of procedure: Percutaneous joint injection.  Position  / Prep / Materials:  Position: Prone  Prep solution: DuraPrep (Iodine Povacrylex [0.7% available iodine] and Isopropyl Alcohol, 74% w/w) Prep Area: Entire posterior lumbosacral area  Materials:  Tray: Block Needle(s):  Type: Spinal  Gauge (G): 22  Length: 3.5-in Qty: 2  Pre-op H&P Assessment:  Bobby Little is a 76 y.o. (year old), male patient, seen today for interventional treatment. 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 Bobby Little has a current medication list which includes the following prescription(s): acetaminophen, atorvastatin, clonazepam, naproxen sodium, and olmesartan. His primarily concern today is the Hip Pain (bilat)  Initial Vital Signs:  Pulse/HCG Rate:  (!) 59ECG Heart Rate: (!) 59 Temp: 97.7 F (36.5 C) Resp: 16 BP: 133/83 SpO2: 100 %  BMI: Estimated body mass index is 24.14 kg/m as calculated from the following:   Height as of this encounter: 6' (1.829 m).   Weight as of this encounter: 178 lb (80.7 kg).  Risk Assessment: Allergies: Reviewed. He is allergic to beta adrenergic blockers, codeine, and trazodone and nefazodone.  Allergy Precautions: None required Coagulopathies: Reviewed. None identified.  Blood-thinner therapy: None at this time Active Infection(s): Reviewed. None identified. Bobby Little is afebrile  Site Confirmation: Bobby Little was asked to confirm the procedure and laterality before marking the site Procedure checklist: Completed Consent: Before the procedure and under the influence of no sedative(s), amnesic(s), or anxiolytics, the patient was informed of the treatment options, risks and possible complications. To fulfill our ethical and legal obligations, as  recommended by the American Medical Association's Code of Ethics, I have informed the patient of my clinical impression; the nature and purpose of the  treatment or procedure; the risks, benefits, and possible complications of the intervention; the alternatives, including doing nothing; the risk(s) and benefit(s) of the alternative treatment(s) or procedure(s); and the risk(s) and benefit(s) of doing nothing. The patient was provided information about the general risks and possible complications associated with the procedure. These may include, but are not limited to: failure to achieve desired goals, infection, bleeding, organ or nerve damage, allergic reactions, paralysis, and death. In addition, the patient was informed of those risks and complications associated to the procedure, such as failure to decrease pain; infection; bleeding; organ or nerve damage with subsequent damage to sensory, motor, and/or autonomic systems, resulting in permanent pain, numbness, and/or weakness of one or several areas of the body; allergic reactions; (i.e.: anaphylactic reaction); and/or death. Furthermore, the patient was informed of those risks and complications associated with the medications. These include, but are not limited to: allergic reactions (i.e.: anaphylactic or anaphylactoid reaction(s)); adrenal axis suppression; blood sugar elevation that in diabetics may result in ketoacidosis or comma; water retention that in patients with history of congestive heart failure may result in shortness of breath, pulmonary edema, and decompensation with resultant heart failure; weight gain; swelling or edema; medication-induced neural toxicity; particulate matter embolism and blood vessel occlusion with resultant organ, and/or nervous system infarction; and/or aseptic necrosis of one or more joints. Finally, the patient was informed that Medicine is not an exact science; therefore, there is also the possibility of unforeseen or  unpredictable risks and/or possible complications that may result in a catastrophic outcome. The patient indicated having understood very clearly. We have given the patient no guarantees and we have made no promises. Enough time was given to the patient to ask questions, all of which were answered to the patient's satisfaction. Mr. Kirksey has indicated that he wanted to continue with the procedure. Attestation: I, the ordering provider, attest that I have discussed with the patient the benefits, risks, side-effects, alternatives, likelihood of achieving goals, and potential problems during recovery for the procedure that I have provided informed consent. Date  Time: 03/07/2022 12:37 PM  Pre-Procedure Preparation:  Monitoring: As per clinic protocol. Respiration, ETCO2, SpO2, BP, heart rate and rhythm monitor placed and checked for adequate function Safety Precautions: Patient was assessed for positional comfort and pressure points before starting the procedure. Time-out: I initiated and conducted the "Time-out" before starting the procedure, as per protocol. The patient was asked to participate by confirming the accuracy of the "Time Out" information. Verification of the correct person, site, and procedure were performed and confirmed by me, the nursing staff, and the patient. "Time-out" conducted as per Joint Commission's Universal Protocol (UP.01.01.01). Time: 1337  Description/Narrative of Procedure:          Rationale (medical necessity): procedure needed and proper for the diagnosis and/or treatment of the patient's medical symptoms and needs. Procedural Technique Safety Precautions: Aspiration looking for blood return was conducted prior to all injections. At no point did we inject any substances, as a needle was being advanced. No attempts were made at seeking any paresthesias. Safe injection practices and needle disposal techniques used. Medications properly checked for expiration dates. SDV  (single dose vial) medications used. Description of the Procedure: Protocol guidelines were followed. The patient was assisted into a comfortable position. The target area was identified and the area prepped in the usual manner. Skin & deeper tissues infiltrated with local anesthetic. Appropriate amount of time allowed to pass for local anesthetics to take effect. The procedure needles  were then advanced to the target area. Proper needle placement secured. Negative aspiration confirmed. Solution injected in intermittent fashion, asking for systemic symptoms every 0.5cc of injectate. The needles were then removed and the area cleansed, making sure to leave some of the prepping solution back to take advantage of its long term bactericidal properties.  10 cc solution made of 9 cc of 0.2% ropivacaine, 1 cc of methylprednisolone, 80 mg/cc.  5 cc injected into each SI joint after contrast confirmation under fluoroscopy.   Afterwards a right piriformis trigger point injection was done 1 cm inferior, 1 cm deep, 1 cm lateral to the inferior fissure of the SI joint.  Contrast was injected to confirm piriformis muscle striation.  10 cc solution made of 9 cc of 0.2% ropivacaine, 1 cc of Decadron 10 mg/cc.  5 cc injected into the right piriformis.While injecting, patient did not complain of any pain radiating down his right leg.   Afterwards a left piriformis trigger point injection was done 1 cm inferior, 1 cm deep, 1 cm lateral to the inferior fissure of the SI joint.  Contrast was injected to confirm piriformis muscle striation.  10 cc solution made of 9 cc of 0.2% ropivacaine, 1 cc of Decadron 10 mg/cc.  5 cc injected into the left  piriformis.While injecting, patient did not complain of any pain radiating down his left leg.  Technical description of procedure:  Fluoroscopy using a posterior anterior 45 degree angle from the midline aiming at the anterolateral aspect of the patient was used to find a direct path  into the sacroiliac joint, the superior medial to posterior superior iliac spine.  The skin was marked where the desired target and the skin infiltrated with local anesthetics.  The procedure needle was then advanced until the joint was entered.  Once inside of the joint, we then proceeded to inject the desired solution.  Vitals:   03/07/22 1334 03/07/22 1339 03/07/22 1342 03/07/22 1345  BP: 126/84 137/76 134/80 136/77  Pulse:      Resp: 14 17 (!) 22 16  Temp:      TempSrc:      SpO2: 99% 97% 97% 100%  Weight:      Height:        Start Time: 1337 hrs. End Time: 1343 hrs.  Post-operative Assessment:  Post-procedure Vital Signs:  Pulse/HCG Rate:  (!) 59 (!) 57 Temp: 97.7 F (36.5 C) Resp: 16 BP: 136/77 SpO2: 100 %  EBL: None  Complications: No immediate post-treatment complications observed by team, or reported by patient.  Note: The patient tolerated the entire procedure well. A repeat set of vitals were taken after the procedure and the patient was kept under observation following institutional policy, for this type of procedure. Post-procedural neurological assessment was performed, showing return to baseline, prior to discharge. The patient was provided with post-procedure discharge instructions, including a section on how to identify potential problems. Should any problems arise concerning this procedure, the patient was given instructions to immediately contact us, at any time, without hesitation. In any case, we plan to contact the patient by telephone for a follow-up status report regarding this interventional procedure.  Comments:  No additional relevant information.  Plan of Care  Orders:  Orders Placed This Encounter  Procedures   DG PAIN CLINIC C-ARM 1-60 MIN NO REPORT    Intraoperative interpretation by procedural physician at [facility].    Standing Status:   Standing    Number of Occurrences:  1    Order Specific Question:   Reason for exam:     Answer:   Assistance in needle guidance and placement for procedures requiring needle placement in or near specific anatomical locations not easily accessible without such assistance.     Medications ordered for procedure: Meds ordered this encounter  Medications   iohexol (OMNIPAQUE) 180 MG/ML injection 10 mL    Must be Myelogram-compatible. If not available, you may substitute with a water-soluble, non-ionic, hypoallergenic, myelogram-compatible radiological contrast medium.   lidocaine (XYLOCAINE) 2 % (with pres) injection 400 mg   dexamethasone (DECADRON) injection 10 mg   methylPREDNISolone acetate (DEPO-MEDROL) injection 80 mg   ropivacaine (PF) 2 mg/mL (0.2%) (NAROPIN) injection 9 mL   ropivacaine (PF) 2 mg/mL (0.2%) (NAROPIN) injection 9 mL   Medications administered: We administered iohexol, lidocaine, dexamethasone, methylPREDNISolone acetate, ropivacaine (PF) 2 mg/mL (0.2%), and ropivacaine (PF) 2 mg/mL (0.2%).  See the medical record for exact dosing, route, and time of administration.  Follow-up plan:   Return in about 4 weeks (around 04/04/2022) for PPE VV.       IL L4/5 ESI #1 09/07/20- not helpful, B/L SIJ + Piriformis 03/07/22       Recent Visits Date Type Provider Dept  02/27/22 Office Visit Gillis Santa, MD [facility]  Showing recent visits within past 90 days and meeting all other requirements Today's Visits Date Type Provider Dept  03/07/22 Procedure visit Gillis Santa, MD [facility]  Showing today's visits and meeting all other requirements Future Appointments Date Type Provider Dept  04/04/22 Appointment Gillis Santa, MD [facility]  Showing future appointments within next 90 days and meeting all other requirements  Disposition: Discharge home  Discharge (Date  Time): 03/07/2022;   hrs.   Primary Care Physician: Virginia Crews, MD Location: [facility] Note by: Gillis Santa, MD Date:  03/07/2022; Time: 2:26 PM  Disclaimer:  Medicine is not an exact science. The only guarantee in medicine is that nothing is guaranteed. It is important to note that the decision to proceed with this intervention was based on the information collected from the patient. The Data and conclusions were drawn from the patient's questionnaire, the interview, and the physical examination. Because the information was provided in large part by the patient, it cannot be guaranteed that it has not been purposely or unconsciously manipulated. Every effort has been made to obtain as much relevant data as possible for this evaluation. It is important to note that the conclusions that lead to this procedure are derived in large part from the available data. Always take into account that the treatment will also be dependent on availability of resources and existing treatment guidelines, considered by other Pain Management Practitioners as being common knowledge and practice, at the time of the intervention. For Medico-Legal purposes, it is also important to point out that variation in procedural techniques and pharmacological choices are the acceptable norm. The indications, contraindications, technique, and results of the above procedure should only be interpreted and judged by a Board-Certified Interventional Pain Specialist with extensive familiarity and expertise in the same exact procedure and technique.

## 2022-03-07 NOTE — Patient Instructions (Signed)

## 2022-03-31 ENCOUNTER — Other Ambulatory Visit: Payer: Self-pay | Admitting: Family Medicine

## 2022-03-31 DIAGNOSIS — E1169 Type 2 diabetes mellitus with other specified complication: Secondary | ICD-10-CM

## 2022-04-04 ENCOUNTER — Encounter: Payer: Self-pay | Admitting: Student in an Organized Health Care Education/Training Program

## 2022-04-04 ENCOUNTER — Ambulatory Visit
Payer: Medicare Other | Attending: Student in an Organized Health Care Education/Training Program | Admitting: Student in an Organized Health Care Education/Training Program

## 2022-04-04 DIAGNOSIS — M533 Sacrococcygeal disorders, not elsewhere classified: Secondary | ICD-10-CM | POA: Diagnosis not present

## 2022-04-04 DIAGNOSIS — M47818 Spondylosis without myelopathy or radiculopathy, sacral and sacrococcygeal region: Secondary | ICD-10-CM

## 2022-04-04 DIAGNOSIS — G5703 Lesion of sciatic nerve, bilateral lower limbs: Secondary | ICD-10-CM | POA: Diagnosis not present

## 2022-04-04 DIAGNOSIS — G894 Chronic pain syndrome: Secondary | ICD-10-CM | POA: Diagnosis not present

## 2022-04-04 DIAGNOSIS — G8929 Other chronic pain: Secondary | ICD-10-CM

## 2022-04-04 NOTE — Progress Notes (Signed)
Patient: Bobby Little  Service Category: E/M  Provider: Gillis Santa, MD  DOB: 1945/11/01  DOS: 04/04/2022  Location: Office  MRN: [id number]  Setting: Ambulatory outpatient  Referring Provider: Virginia Crews, MD  Type: Established Patient  Specialty: Interventional Pain Management  PCP: Virginia Crews, MD  Location: Remote location  Delivery: TeleHealth     Virtual Encounter - Pain Management PROVIDER NOTE: Information contained herein reflects review and annotations entered in association with encounter. Interpretation of such information and data should be left to medically-trained personnel. Information provided to patient can be located elsewhere in the medical record under "Patient Instructions". Document created using STT-dictation technology, any transcriptional errors that may result from process are unintentional.    Contact & Pharmacy Preferred: [phone] Home: [phone] (home) Mobile: [phone] (mobile) E-mail: [email or web address]  [facility], [place] [address] Phone: [phone] Fax: [phone]  [facility], [address] [place] [address] Phone: [phone] Fax: [phone]   Pre-screening  Mr. Likins offered "in-person" vs "virtual" encounter. He indicated preferring virtual for this encounter.   Reason COVID-19*  Social distancing based on CDC and AMA recommendations.   I contacted [name] on 04/04/2022 via telephone.      I clearly identified myself as Gillis Santa, MD. I verified that I was speaking with the correct person using two identifiers (Name: Yakub Lodes, and date of birth: 01-17-1946).  Consent I sought verbal advanced consent from [name] for virtual visit interactions. I informed Mr. Hernan of possible security and privacy  concerns, risks, and limitations associated with providing "not-in-person" medical evaluation and management services. I also informed Mr. Gillian of the availability of "in-person" appointments. Finally, I informed him that there would be a charge for the virtual visit and that he could be  personally, fully or partially, financially responsible for it. Mr. Jolliff expressed understanding and agreed to proceed.   Historic Elements   Mr. Adrain Nesbit is a 76 y.o. year old, male patient evaluated today after our last contact on 03/07/2022. Mr. 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 Mr. Andujo has a current medication list which includes the following prescription(s): acetaminophen, atorvastatin, clonazepam, naproxen sodium, and olmesartan. He  reports that he has been smoking cigars. He has never used smokeless tobacco. He reports that he does not drink alcohol and does not use drugs. Mr. Prehn is allergic to beta adrenergic blockers, codeine, and trazodone and nefazodone.   HPI  Today, he is being contacted for a post-procedure assessment.   Post-procedure evaluation   Type: Bilateral Sacroiliac Joint Steroid Injection  & bilateral piriformis  trigger point injection     Level: PSIS (Posterior inferior iliac Spine)  Imaging: Fluoroscopic guidance Anesthesia: Local anesthesia (1-2% Lidocaine) DOS: 03/07/2022  Performed by: Gillis Santa, MD  Purpose: Diagnostic/Therapeutic  Indications: Sacroiliac joint pain in the lower back and hip area severe enough to impact quality of life or function. Rationale (medical necessity): procedure needed and proper for the diagnosis and/or treatment of Mr. Reitman's medical symptoms and needs. 1. Chronic SI joint pain   2. SI joint arthritis   3. Piriformis syndrome of both sides   4. Chronic pain syndrome    NAS-11 Pain score:   Pre-procedure: 10-Worst pain ever/10   Post-procedure: 10-Worst pain ever/10      Effectiveness:  Initial hour after procedure: 100% Subsequent 4-6 hours post-procedure: 100% Analgesia past initial 6 hours:100% Ongoing improvement:  Analgesic:  40-50%  Function: Somewhat improved ROM: Somewhat improved   Laboratory Chemistry Profile   Renal Lab Results  Component Value Date   BUN 15 01/12/2022   CREATININE 0.95 01/12/2022   BCR 16 01/12/2022   GFRAA 85 05/20/2020   GFRNONAA 74 05/20/2020    Hepatic Lab Results  Component Value Date   AST 22 01/12/2022   ALT 20 01/12/2022   ALBUMIN 4.5 01/12/2022   ALKPHOS 99 01/12/2022    Electrolytes Lab Results  Component Value Date   NA 137 01/12/2022   K 4.7 01/12/2022   CL 100 01/12/2022   CALCIUM 9.2 01/12/2022    Bone No results found for: "VD25OH", "VD125OH2TOT", "LF8101BP1", "WC5852DP8", "25OHVITD1", "25OHVITD2", "25OHVITD3", "TESTOFREE", "TESTOSTERONE"  Inflammation (CRP: Acute Phase) (ESR: Chronic Phase) No results found for: "CRP", "ESRSEDRATE", "LATICACIDVEN"       Note: Above Lab results reviewed.   Assessment  The primary encounter diagnosis was Chronic SI joint pain. Diagnoses of SI joint arthritis, Piriformis syndrome of both sides, and Chronic pain syndrome were also pertinent to this  visit.  Plan of Care    Overall patient is endorsing moderate pain relief and functional benefit after his bilateral SI joint and piriformis injection.  He states that this was more beneficial than his epidural steroid injection which suggests that there is less radicular component to his pain.  We will continue to monitor his symptoms.  He is instructed to call me if he has increased pain in his SI joint or piriformis and if he would like to repeat the injection.  Patient endorsed understanding.   Follow-up plan:   Return for PRN- pt will call to discuss repeat SI-J + piriformis .     IL L4/5 ESI #1 09/07/20- not helpful, B/L SIJ + Piriformis 03/07/22: helped        Recent Visits Date Type Provider Dept  03/07/22 Procedure visit Gillis Santa, MD [facility]  02/27/22 Office Visit Gillis Santa, MD [facility]  Showing recent visits within past 90 days and meeting all other requirements Today's Visits Date Type Provider Dept  04/04/22 Office Visit Gillis Santa, MD [facility]  Showing today's visits and meeting all other requirements Future Appointments No visits were found meeting these conditions. Showing future appointments within next 90 days and meeting all other requirements  I discussed the assessment and treatment plan with the patient. The patient was provided an opportunity to ask questions and all were answered. The patient agreed with the plan and demonstrated an understanding of the instructions.  Patient advised to call back or seek an in-person evaluation if the symptoms or condition worsens.  Duration of encounter: 79minutes.  Note by: Gillis Santa, MD Date: 04/04/2022; Time: 3:57 PM

## 2022-04-05 ENCOUNTER — Encounter: Payer: Self-pay | Admitting: Family Medicine

## 2022-04-12 ENCOUNTER — Ambulatory Visit: Payer: Self-pay

## 2022-04-12 NOTE — Patient Outreach (Signed)
  Care Coordination   Initial Visit Note   04/12/2022 Name: Bobby Little MRN: [id number] DOB: July 25, 1945  [name] is a 76 y.o. year old male who sees Bacigalupo, Dionne Bucy, MD for primary care. I spoke with  [name] by phone today.  What matters to the patients health and wellness today?  Back and hip pain     Goals Addressed             This Visit's Progress    RNCM: Effective Management of Chronic Pain       Care Coordination Interventions: Rates pain in his back and hips today at 9 today. States today is a bad day.  Reviewed provider established plan for pain management. The patient sees a pain specialist and has had injections. He has good days and bad days. The patient was having a bad day today. Even when he is having bad days he still is active as he can be. Denies any issues with with falls or safety concerns. He does carry a cane with him when going out just in case he needs it.  Discussed importance of adherence to all scheduled medical appointments. The patient goes next week for vaccinations. Education and support provided. Will attach vaccine information to the AVS. Will see pcp on 07-09-2022 at 1040 am Counseled on the importance of reporting any/all new or changed pain symptoms or management strategies to pain management provider Advised patient to report to care team affect of pain on daily activities Discussed use of relaxation techniques and/or diversional activities to assist with pain reduction (distraction, imagery, relaxation, massage, acupressure, TENS, heat, and cold application. The patient does use a heating pad and has a TENS unit. Education and support  Reviewed with patient prescribed pharmacological and nonpharmacological pain relief strategies. The patient takes medications as prescribed. Had injections In August that last until a couple of days ago in his hips Advised patient to discuss unresolved pain, changes in level or intensity of pain with  provider Screening for signs and symptoms of depression related to chronic disease state  Assessed social determinant of health barriers Reviewed with the patient the goals of the care for the care coordination program and the availability of the [facility] and other resources.           SDOH assessments and interventions completed:  Yes  SDOH Interventions Today    Flowsheet Row Most Recent Value  SDOH Interventions   Food Insecurity Interventions Intervention Not Indicated  Housing Interventions Intervention Not Indicated  Transportation Interventions Intervention Not Indicated  Utilities Interventions Intervention Not Indicated  Alcohol Usage Interventions Intervention Not Indicated (Score <7)  Financial Strain Interventions Intervention Not Indicated        Care Coordination Interventions Activated:  Yes  Care Coordination Interventions:  Yes, provided   Follow up plan: No further intervention required.   Encounter Outcome:  Pt. Visit Completed   Noreene Larsson RN, MSN, [place]  Mobile: [phone]

## 2022-04-12 NOTE — Patient Instructions (Signed)
Visit Information  Thank you for taking time to visit with me today. Please don't hesitate to contact me if I can be of assistance to you.   Following are the goals we discussed today:   Goals Addressed             This Visit's Progress    RNCM: Effective Management of Chronic Pain       Care Coordination Interventions: Rates pain in his back and hips today at 9 today. States today is a bad day.  Reviewed provider established plan for pain management. The patient sees a pain specialist and has had injections. He has good days and bad days. The patient was having a bad day today. Even when he is having bad days he still is active as he can be. Denies any issues with with falls or safety concerns. He does carry a cane with him when going out just in case he needs it.  Discussed importance of adherence to all scheduled medical appointments. The patient goes next week for vaccinations. Education and support provided. Will attach vaccine information to the AVS. Will see pcp on 07-09-2022 at 1040 am Counseled on the importance of reporting any/all new or changed pain symptoms or management strategies to pain management provider Advised patient to report to care team affect of pain on daily activities Discussed use of relaxation techniques and/or diversional activities to assist with pain reduction (distraction, imagery, relaxation, massage, acupressure, TENS, heat, and cold application. The patient does use a heating pad and has a TENS unit. Education and support  Reviewed with patient prescribed pharmacological and nonpharmacological pain relief strategies. The patient takes medications as prescribed. Had injections In August that last until a couple of days ago in his hips Advised patient to discuss unresolved pain, changes in level or intensity of pain with provider Screening for signs and symptoms of depression related to chronic disease state  Assessed social determinant of health  barriers Reviewed with the patient the goals of the care for the care coordination program and the availability of the [facility] and other resources.            Please call the care guide team at [phone] if you need to schedule an appointment.   If you are experiencing a Mental Health or [place] or need someone to talk to, please call the Suicide and Crisis Lifeline: 988 call the [place] National Suicide Prevention Lifeline: 442-056-0468 or TTY: (708) 438-4517 TTY 610-347-8728) to talk to a trained counselor call 1-800-273-TALK (toll free, 24 hour hotline)  Patient verbalizes understanding of instructions and care plan provided today and agrees to view in [place]. Active MyChart status and patient understanding of how to access instructions and care plan via MyChart confirmed with patient.     No further follow up required: the patient has contact information to call the [facility] for questions, concerns,  and, educational needs  Noreene Larsson RN, MSN, [place]: [phone]    Meningococcal Group B Vaccine (4 strain) suspension for injection What is this medication? MENINGOCOCCAL GROUP B VACCINE, RECOMBINANT (muh ning goh KOK kal vak SEEN) is a vaccine to protect from bacterial meningitis. This vaccine does not contain live bacteria. It will not cause a meningitis. This medicine may be used for other purposes; ask your health care provider or pharmacist if you have questions. This medicine may be used for other purposes; ask your health care provider or pharmacist if you have questions.  COMMON BRAND NAME(S): TRUMENBA What should I tell my care team before I take this medication? They need to know if you have any of these conditions: bleeding disorder fever or infection immune system problems an unusual or allergic reaction to meningococcal vaccine, other medicines, foods, dyes, or preservatives pregnant or trying to get  pregnant breast-feeding How should I use this medication? This medicine is for injection into a muscle. It is given by a health care professional in a hospital or clinic setting. A copy of Vaccine Information Statements will be given before each vaccination. Read this sheet carefully each time. The sheet may change frequently. Talk to your pediatrician regarding the use of this medicine in children. While this drug may be prescribed for children as young as 36 years of age for selected conditions, precautions do apply. Overdosage: If you think you have taken too much of this medicine contact a poison control center or emergency room at once. NOTE: This medicine is only for you. Do not share this medicine with others. Overdosage: If you think you have taken too much of this medicine contact a poison control center or emergency room at once. NOTE: This medicine is only for you. Do not share this medicine with others. What if I miss a dose? It is important not to miss your dose. Call your doctor or health care professional if you are unable to keep an appointment. What may interact with this medication? certain medicines that treat or prevent blood clots medicines that lower your chance of fighting infection other vaccines This list may not describe all possible interactions. Give your health care provider a list of all the medicines, herbs, non-prescription drugs, or dietary supplements you use. Also tell them if you smoke, drink alcohol, or use illegal drugs. Some items may interact with your medicine. This list may not describe all possible interactions. Give your health care provider a list of all the medicines, herbs, non-prescription drugs, or dietary supplements you use. Also tell them if you smoke, drink alcohol, or use illegal drugs. Some items may interact with your medicine. What should I watch for while using this medication? Report any side effects that are worrisome to your doctor right  away. This vaccine may not protect from all meningitis infections. What side effects may I notice from receiving this medication? Side effects that you should report to your doctor or health care professional as soon as possible: allergic reactions like skin rash, itching or hives, swelling of the face, lips, or tongue breathing problems Side effects that usually do not require medical attention (report to your doctor or health care professional if they continue or are bothersome): chills diarrhea fever headache joint pain muscle pain pain, redness, or irritation at site where injected This list may not describe all possible side effects. Call your doctor for medical advice about side effects. You may report side effects to FDA at 1-800-FDA-1088. This list may not describe all possible side effects. Call your doctor for medical advice about side effects. You may report side effects to FDA at 1-800-FDA-1088. Where should I keep my medication? This vaccine is given in a hospital or clinic and will not be stored at home. NOTE: This sheet is a summary. It may not cover all possible information. If you have questions about this medicine, talk to your doctor, pharmacist, or health care provider.  2023 Elsevier/Gold Standard (2015-08-11 00:00:00)

## 2022-04-17 ENCOUNTER — Ambulatory Visit (INDEPENDENT_AMBULATORY_CARE_PROVIDER_SITE_OTHER): Payer: Medicare Other

## 2022-04-17 DIAGNOSIS — Z23 Encounter for immunization: Secondary | ICD-10-CM | POA: Diagnosis not present

## 2022-05-10 ENCOUNTER — Encounter: Payer: Self-pay | Admitting: Family Medicine

## 2022-05-10 ENCOUNTER — Encounter: Payer: Self-pay | Admitting: Student in an Organized Health Care Education/Training Program

## 2022-05-10 NOTE — Telephone Encounter (Signed)
Ok to place referral to new Nephrologist per patient request for bilateral renal cysts.  However, he should know that he is unlikely to be seen by another office prior to his upcoming appt in early November with Dr Candiss Norse.  If he is having new symptoms, like this burning he describes, we can see him to investigate.

## 2022-05-10 NOTE — Telephone Encounter (Signed)
Patient advised as below. Patient reports he will wait on appt with Dr. Candiss Norse scheduled for 05/30/22.

## 2022-05-22 ENCOUNTER — Encounter: Payer: Self-pay | Admitting: Family Medicine

## 2022-05-31 ENCOUNTER — Other Ambulatory Visit: Payer: Self-pay | Admitting: Nephrology

## 2022-05-31 DIAGNOSIS — N281 Cyst of kidney, acquired: Secondary | ICD-10-CM

## 2022-05-31 DIAGNOSIS — I10 Essential (primary) hypertension: Secondary | ICD-10-CM

## 2022-06-01 NOTE — Progress Notes (Unsigned)
    I,[name],acting as a Education administrator for [name], MD.,have documented all relevant documentation on the behalf of [name], MD,as directed by  [name], MD while in the presence of [name], MD.    Established patient visit   Patient: Bobby Little   DOB: [birth year]   76 y.o. Male  MRN: [id number] Visit Date: 06/04/2022  Today's healthcare provider: [name], MD   No chief complaint on file.  Subjective    HPI  Patient here for meningitis B vaccine. Last Men b vaccine was given 05/20/20. 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    Medications: Outpatient Medications Prior to Visit  Medication Sig   acetaminophen (TYLENOL) 500 MG tablet Take 500 mg by mouth.   atorvastatin (LIPITOR) 40 MG tablet TAKE 1 TABLET DAILY   clonazePAM (KLONOPIN) 1 MG tablet Take 1 mg by mouth 2 (two) times daily.   naproxen sodium (ALEVE) 220 MG tablet Take 220 mg by mouth daily as needed. 3 times weekly   olmesartan (BENICAR) 40 MG tablet Take 1 tablet (40 mg total) by mouth daily.   No facility-administered medications prior to visit.    Review of Systems  {Labs  Heme  Chem  Endocrine  Serology  Results Review (optional):23779}   Objective    There were no vitals taken for this visit. {Show previous vital signs (optional):23777}  Physical Exam  ***  No results found for any visits on 06/04/22.  Assessment & Plan     ***  No follow-ups on file.      {provider attestation***:1}   [name], MD  [facility] [phone] (phone) [phone] (fax)  [place]

## 2022-06-04 ENCOUNTER — Encounter: Payer: Self-pay | Admitting: Family Medicine

## 2022-06-04 ENCOUNTER — Ambulatory Visit (INDEPENDENT_AMBULATORY_CARE_PROVIDER_SITE_OTHER): Payer: Medicare Other | Admitting: Family Medicine

## 2022-06-04 VITALS — BP 129/80 | HR 66 | Ht 72.0 in | Wt 180.0 lb

## 2022-06-04 DIAGNOSIS — Z9081 Acquired absence of spleen: Secondary | ICD-10-CM | POA: Diagnosis not present

## 2022-06-04 DIAGNOSIS — Q8901 Asplenia (congenital): Secondary | ICD-10-CM

## 2022-06-04 DIAGNOSIS — G5703 Lesion of sciatic nerve, bilateral lower limbs: Secondary | ICD-10-CM

## 2022-06-04 DIAGNOSIS — Z23 Encounter for immunization: Secondary | ICD-10-CM | POA: Diagnosis not present

## 2022-06-04 DIAGNOSIS — M5440 Lumbago with sciatica, unspecified side: Secondary | ICD-10-CM

## 2022-06-04 DIAGNOSIS — I10 Essential (primary) hypertension: Secondary | ICD-10-CM

## 2022-06-04 DIAGNOSIS — G8929 Other chronic pain: Secondary | ICD-10-CM

## 2022-06-04 NOTE — Assessment & Plan Note (Signed)
Continue to get meningitis boosters (menA q19yr and men B q2-3 yrs) menB today

## 2022-06-04 NOTE — Assessment & Plan Note (Signed)
As above -see plan for chronic low back pain

## 2022-06-04 NOTE — Assessment & Plan Note (Signed)
Has seen ortho and pain management Reports he has tried and failed everything Continue topical lidocaine and lidocaine patches at home Does not want to pursue any further injections with pain management Will try gabapentin again at '300mg'$  qhs Advised to watch for drowsiness and we can consider 300 TID dosing

## 2022-06-04 NOTE — Assessment & Plan Note (Addendum)
Well controlled Continue current medications Reviewed recent metabolic panel F/u in 6 months  

## 2022-06-06 ENCOUNTER — Ambulatory Visit
Admission: RE | Admit: 2022-06-06 | Discharge: 2022-06-06 | Disposition: A | Discharge status: Home / Self Care | Payer: Medicare Other | Source: Ambulatory Visit | Attending: Nephrology | Admitting: Nephrology

## 2022-06-06 DIAGNOSIS — I10 Essential (primary) hypertension: Secondary | ICD-10-CM | POA: Insufficient documentation

## 2022-06-06 DIAGNOSIS — N281 Cyst of kidney, acquired: Secondary | ICD-10-CM | POA: Diagnosis present

## 2022-06-07 ENCOUNTER — Encounter: Payer: Self-pay | Admitting: Family Medicine

## 2022-06-07 MED ORDER — GABAPENTIN 300 MG PO CAPS: 300.0000 mg | ORAL_CAPSULE | Freq: Every day | ORAL | 1 refills | Status: DC

## 2022-06-13 ENCOUNTER — Telehealth: Payer: Self-pay | Admitting: Family Medicine

## 2022-06-13 NOTE — Telephone Encounter (Signed)
Pt called in to provide his insurance Part D information for billing. Pt says that he received a injection at his most recent visit with Dr. Jacinto Reap. Pt says that he spoke with his insurance and is able to have it billed/filled with them.    [employer] [facility] - ID #: [id number]  Phone to provider services: [phone]   Pt would like to have this updated for billing DOS 11/13

## 2022-06-21 ENCOUNTER — Encounter: Payer: Self-pay | Admitting: Family Medicine

## 2022-06-24 ENCOUNTER — Encounter: Payer: Self-pay | Admitting: Family Medicine

## 2022-06-24 ENCOUNTER — Encounter: Payer: Self-pay | Admitting: Urology

## 2022-06-25 ENCOUNTER — Encounter: Payer: Self-pay | Admitting: Family Medicine

## 2022-06-27 ENCOUNTER — Other Ambulatory Visit: Payer: Self-pay | Admitting: Family Medicine

## 2022-06-27 DIAGNOSIS — E1169 Type 2 diabetes mellitus with other specified complication: Secondary | ICD-10-CM

## 2022-06-27 NOTE — Telephone Encounter (Signed)
Requested Prescriptions  Pending Prescriptions Disp Refills   atorvastatin (LIPITOR) 40 MG tablet [Pharmacy Med Name: ATORVASTATIN TAB '40MG'$ ] 90 tablet 0    Sig: TAKE 1 TABLET DAILY     Cardiovascular:  Antilipid - Statins Failed - 06/27/2022 11:34 AM      Failed - Lipid Panel in normal range within the last 12 months    Cholesterol, Total  Date Value Ref Range Status  01/12/2022 156 100 - 199 mg/dL Final   LDL Chol Calc (NIH)  Date Value Ref Range Status  01/12/2022 81 0 - 99 mg/dL Final   HDL  Date Value Ref Range Status  01/12/2022 61 >39 mg/dL Final   Triglycerides  Date Value Ref Range Status  01/12/2022 69 0 - 149 mg/dL Final         Passed - Patient is not pregnant      Passed - Valid encounter within last 12 months    Recent Outpatient Visits           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

## 2022-06-29 NOTE — Telephone Encounter (Signed)
We are unable to file part D.  Part D is pharmacy only.

## 2022-07-02 ENCOUNTER — Other Ambulatory Visit: Payer: Self-pay | Admitting: Family Medicine

## 2022-07-02 DIAGNOSIS — I10 Essential (primary) hypertension: Secondary | ICD-10-CM

## 2022-07-09 ENCOUNTER — Ambulatory Visit: Payer: Medicare Other | Admitting: Family Medicine

## 2022-07-19 ENCOUNTER — Encounter: Payer: Self-pay | Admitting: Family Medicine

## 2022-07-19 ENCOUNTER — Other Ambulatory Visit: Payer: Self-pay | Admitting: Family Medicine

## 2022-07-19 MED ORDER — AMLODIPINE BESYLATE 5 MG PO TABS: 5.0000 mg | ORAL_TABLET | Freq: Every day | ORAL | 11 refills | Status: DC

## 2022-07-30 ENCOUNTER — Ambulatory Visit (INDEPENDENT_AMBULATORY_CARE_PROVIDER_SITE_OTHER): Payer: Medicare Other | Admitting: Family Medicine

## 2022-07-30 ENCOUNTER — Encounter: Payer: Self-pay | Admitting: Family Medicine

## 2022-07-30 VITALS — BP 103/63 | HR 65 | Temp 97.9°F | Resp 16 | Wt 185.0 lb

## 2022-07-30 DIAGNOSIS — M5440 Lumbago with sciatica, unspecified side: Secondary | ICD-10-CM

## 2022-07-30 DIAGNOSIS — M48062 Spinal stenosis, lumbar region with neurogenic claudication: Secondary | ICD-10-CM

## 2022-07-30 DIAGNOSIS — Z23 Encounter for immunization: Secondary | ICD-10-CM

## 2022-07-30 DIAGNOSIS — G8929 Other chronic pain: Secondary | ICD-10-CM

## 2022-07-30 DIAGNOSIS — I10 Essential (primary) hypertension: Secondary | ICD-10-CM

## 2022-07-30 DIAGNOSIS — K5904 Chronic idiopathic constipation: Secondary | ICD-10-CM | POA: Diagnosis not present

## 2022-07-30 MED ORDER — AMLODIPINE BESYLATE 5 MG PO TABS: 5.0000 mg | ORAL_TABLET | Freq: Every day | ORAL | 3 refills | Status: DC

## 2022-07-30 MED ORDER — GABAPENTIN 300 MG PO CAPS: 300.0000 mg | ORAL_CAPSULE | Freq: Three times a day (TID) | ORAL | 3 refills | Status: DC

## 2022-07-30 NOTE — Assessment & Plan Note (Addendum)
Well controlled since adding amlodipine Continue current medications Reviewed last metabolic panel F/u in 6 months

## 2022-07-30 NOTE — Assessment & Plan Note (Signed)
Has seen Ortho and pain management previously Reports he has tried and failed everything  Continue topical lidocaine and lidocaine patches Is affecting his sleep and function (cannot stand in the shower) Does not want to pursue more injections with pain management Will try increasing gabapentin to '300mg'$  TID (reports once daily dosing did not help)

## 2022-07-30 NOTE — Assessment & Plan Note (Signed)
Encourage regular use of miralax Titrate to one soft BM daily

## 2022-07-30 NOTE — Assessment & Plan Note (Signed)
As above for chronic back pain

## 2022-07-30 NOTE — Addendum Note (Signed)
Addended by: Shawna Orleans on: 07/30/2022 03:27 PM   Modules accepted: Orders

## 2022-07-30 NOTE — Progress Notes (Signed)
Bobby Little,Sulibeya S Dimas,acting as a Education administrator for Bobby Paganini, MD.,have documented all relevant documentation on the behalf of Bobby Paganini, MD,as directed by  Bobby Paganini, MD while in the presence of Bobby Paganini, MD.     Established patient visit   Patient: Bobby Little   DOB: [birth year]   77 y.o. Male  MRN: [id number] Visit Date: 07/30/2022  Today's healthcare provider: [name], MD   Chief Complaint  Patient presents with   Hypertension   Back Pain   Subjective    Hypertension, follow-up  BP Readings from Last 3 Encounters:  07/30/22 103/63  06/04/22 129/80  03/07/22 136/77   Wt Readings from Last 3 Encounters:  07/30/22 185 lb (83.9 kg)  06/04/22 180 lb (81.6 kg)  03/07/22 178 lb (80.7 kg)     He was last seen for hypertension 2 months ago.  BP at that visit was 129/80. Management since that visit includes no changes.  Called/messaged in when it was elevated 2 wks ago and norvasc '5mg'$  daily was added  He reports excellent compliance with treatment. He is not having side effects.  Use of agents associated with hypertension: none.   Outside blood pressures are stable.  Pertinent labs Lab Results  Component Value Date   CHOL 156 01/12/2022   HDL 61 01/12/2022   LDLCALC 81 01/12/2022   TRIG 69 01/12/2022   CHOLHDL 2.6 01/12/2022   Lab Results  Component Value Date   NA 137 01/12/2022   K 4.7 01/12/2022   CREATININE 0.95 01/12/2022   EGFR 83 01/12/2022   GLUCOSE 92 01/12/2022   TSH 3.140 04/03/2021     The 10-year ASCVD risk score (Arnett DK, et al., 2019) is: 35%  --------------------------------------------------------------------------------------------------- Back Pain  He reports chronic back pain. The most recent episode started about a month ago and is staying constant. The pain is located in the across the lower back or hipwith radiation. It is described as aching, burning, pinching, sharp, soreness, stabbing,  stiffness, throbbing, and tingling, is moderate and severe in intensity, occurring constantly. Symptoms are worse in the: morning, mid-day, afternoon, evening, nighttime  Aggravating factors: recumbency Relieving factors: none.  He has tried application of heat, application of ice, and acetaminophen with little relief.  -----------------------------------------------------------------------------------------   Medications: Outpatient Medications Prior to Visit  Medication Sig   acetaminophen (TYLENOL) 500 MG tablet Take 500 mg by mouth.   atorvastatin (LIPITOR) 40 MG tablet TAKE 1 TABLET DAILY   clonazePAM (KLONOPIN) 1 MG tablet Take 1 mg by mouth 2 (two) times daily.   lidocaine (LMX) 4 % cream Apply 1 Application topically as needed.   Multiple Vitamins-Minerals (MULTIVITAMIN WITH MINERALS) tablet Take by mouth.   naproxen sodium (ALEVE) 220 MG tablet Take 220 mg by mouth daily as needed. 3 times weekly   olmesartan (BENICAR) 40 MG tablet TAKE 1 TABLET DAILY   [DISCONTINUED] amLODipine (NORVASC) 5 MG tablet Take 1 tablet (5 mg total) by mouth daily.   [DISCONTINUED] gabapentin (NEURONTIN) 300 MG capsule Take 1 capsule (300 mg total) by mouth at bedtime. Take 300 mg by mouth at bedtime. (Patient not taking: Reported on 07/30/2022)   No facility-administered medications prior to visit.    Review of Systems  Constitutional:  Negative for chills and fever.  Eyes:  Negative for visual disturbance.  Respiratory:  Negative for chest tightness and shortness of breath.   Cardiovascular:  Negative for chest pain and leg swelling.  Gastrointestinal:  Negative for abdominal pain, nausea and vomiting.  Musculoskeletal:  Positive for back pain.  Neurological:  Positive for dizziness, light-headedness and headaches.  Psychiatric/Behavioral:  Positive for sleep disturbance.        Objective    BP 103/63 (BP Location: Right Arm, Patient Position: Sitting, Cuff Size: Large)   Pulse 65   Temp 97.9  F (36.6 C) (Oral)   Resp 16   Wt 185 lb (83.9 kg)   SpO2 100%   BMI 25.09 kg/m  BP Readings from Last 3 Encounters:  07/30/22 103/63  06/04/22 129/80  03/07/22 136/77   Wt Readings from Last 3 Encounters:  07/30/22 185 lb (83.9 kg)  06/04/22 180 lb (81.6 kg)  03/07/22 178 lb (80.7 kg)      Physical Exam Vitals reviewed.  Constitutional:      General: He is not in acute distress.    Appearance: Normal appearance. He is not diaphoretic.  HENT:     Head: Normocephalic and atraumatic.  Eyes:     General: No scleral icterus.    Conjunctiva/sclera: Conjunctivae normal.  Cardiovascular:     Rate and Rhythm: Normal rate and regular rhythm.     Heart sounds: Normal heart sounds. No murmur heard. Pulmonary:     Effort: Pulmonary effort is normal. No respiratory distress.     Breath sounds: Normal breath sounds. No wheezing or rhonchi.  Musculoskeletal:     Cervical back: Neck supple.     Right lower leg: No edema.     Left lower leg: No edema.  Lymphadenopathy:     Cervical: No cervical adenopathy.  Skin:    General: Skin is warm and dry.     Findings: No rash.  Neurological:     Mental Status: He is alert and oriented to person, place, and time. Mental status is at baseline.  Psychiatric:        Mood and Affect: Mood normal.        Behavior: Behavior normal.       No results found for any visits on 07/30/22.  Assessment & Plan     Problem List Items Addressed This Visit       Cardiovascular and Mediastinum   Essential hypertension - Primary    Well controlled since adding amlodipine Continue current medications Reviewed last metabolic panel F/u in 6 months       Relevant Medications   amLODipine (NORVASC) 5 MG tablet     Digestive   Chronic idiopathic constipation    Encourage regular use of miralax Titrate to one soft BM daily        Nervous and Auditory   Chronic bilateral low back pain with sciatica    Has seen Ortho and pain management  previously Reports he has tried and failed everything  Continue topical lidocaine and lidocaine patches Is affecting his sleep and function (cannot stand in the shower) Does not want to pursue more injections with pain management Will try increasing gabapentin to '300mg'$  TID (reports once daily dosing did not help)      Relevant Medications   gabapentin (NEURONTIN) 300 MG capsule     Other   Spinal stenosis, lumbar region, with neurogenic claudication    As above for chronic back pain      Relevant Medications   gabapentin (NEURONTIN) 300 MG capsule     Return in about 6 months (around 01/28/2023) for AWV.      Bobby Little, Bobby Paganini, MD, have reviewed all documentation for this visit. The documentation on 07/30/22 for the  exam, diagnosis, procedures, and orders are all accurate and complete.   Gerado Nabers, Dionne Bucy, MD, MPH [facility]

## 2022-08-07 ENCOUNTER — Encounter: Payer: Self-pay | Admitting: Family Medicine

## 2022-08-08 NOTE — Telephone Encounter (Signed)
Please review.  KP

## 2022-08-20 ENCOUNTER — Ambulatory Visit: Payer: Medicare Other | Admitting: Family Medicine

## 2022-08-26 ENCOUNTER — Encounter: Payer: Self-pay | Admitting: Family Medicine

## 2022-10-16 ENCOUNTER — Encounter: Payer: Self-pay | Admitting: Urology

## 2022-10-29 ENCOUNTER — Other Ambulatory Visit: Payer: Self-pay | Admitting: Family Medicine

## 2022-10-29 DIAGNOSIS — E1169 Type 2 diabetes mellitus with other specified complication: Secondary | ICD-10-CM

## 2022-10-30 NOTE — Telephone Encounter (Signed)
Requested Prescriptions  Pending Prescriptions Disp Refills   atorvastatin (LIPITOR) 40 MG tablet [Pharmacy Med Name: ATORVASTATIN CALCIUM 40MG  TABLET] 45 tablet 0    Sig: TAKE ONE HALF (1/2) A TABLET BY MOUTH DAILY PAIN & TEMPERATURE NEEDS OFFICE VISIT FOR FUTURE REFILLS     Cardiovascular:  Antilipid - Statins Failed - 10/29/2022 11:11 AM      Failed - Lipid Panel in normal range within the last 12 months    Cholesterol, Total  Date Value Ref Range Status  01/12/2022 156 100 - 199 mg/dL Final   LDL Chol Calc (NIH)  Date Value Ref Range Status  01/12/2022 81 0 - 99 mg/dL Final   HDL  Date Value Ref Range Status  01/12/2022 61 >39 mg/dL Final   Triglycerides  Date Value Ref Range Status  01/12/2022 69 0 - 149 mg/dL Final         Passed - Patient is not pregnant      Passed - Valid encounter within last 12 months    Recent Outpatient Visits           3 months ago Essential hypertension   [facility] [place], Marzella Schlein, MD   4 months ago S/P splenectomy   [place] [facility], Marzella Schlein, MD   9 months ago Encounter for annual wellness visit (AWV) in Medicare patient   [facility] [place], Marzella Schlein, MD   1 year ago Swelling of left testicle   [facility] Malva Limes, MD   1 year ago Prediabetes   [facility] [place], Marzella Schlein, MD       Future Appointments             In 2 weeks Stoioff, Verna Czech, MD [facility] [place]   In 3 months Bacigalupo, Marzella Schlein, MD [facility]

## 2022-11-15 ENCOUNTER — Ambulatory Visit: Payer: Medicare Other | Admitting: Urology

## 2022-11-29 ENCOUNTER — Encounter: Payer: Self-pay | Admitting: Urology

## 2022-11-29 ENCOUNTER — Ambulatory Visit (INDEPENDENT_AMBULATORY_CARE_PROVIDER_SITE_OTHER): Payer: Medicare Other | Admitting: Urology

## 2022-11-29 VITALS — BP 104/66 | HR 63 | Ht 72.0 in | Wt 180.0 lb

## 2022-11-29 DIAGNOSIS — N433 Hydrocele, unspecified: Secondary | ICD-10-CM | POA: Diagnosis not present

## 2022-11-29 NOTE — Progress Notes (Signed)
Bobby Little,acting as a scribe for Bobby Altes, MD.,have documented all relevant documentation on the behalf of Bobby Altes, MD,as directed by  Bobby Altes, MD while in the presence of Bobby Altes, MD.  11/29/2022 1:17 PM   Bobby Little September 29, 1945 [id number]  Referring provider: Erasmo Downer, MD [address] [place],  [place] [address]  Chief Complaint  Patient presents with   Hydrocele    HPI: Bobby Little is a 77 y.o. male who presents today for follow up.  History of left hydrocele, who initially elected aspiration, which was performed ion 12/2021- 150 mL of fluid was aspirated Recently contacted office, stating increased hydrocele size and possibly needing repeated aspiration He states that after he called, the hydrocele has decreased in size and is much smaller.    PMH: Past Medical History:  Diagnosis Date   Anxiety    x 20 y per pt   Arthritis    Degenerative disc disease, lumbar    Depression    Diverticulitis    Family history of polyps in the colon    Gastrointestinal stromal tumor (GIST) (HCC)    GIST (gastrointestinal stroma tumor), malignant, colon (HCC)    GIST (gastrointestinal stroma tumor), malignant, colon (HCC)    Hay fever/allergies    Hepatic cyst    History of spinal fusion for scoliosis    Hypertension    Polycystic kidney    Prediabetes    Spinal stenosis of lumbar region     Surgical History: Past Surgical History:  Procedure Laterality Date   COLONOSCOPY WITH PROPOFOL N/A 11/22/2016   Procedure: COLONOSCOPY WITH PROPOFOL;  Surgeon: Bobby Mood, MD;  Location: [facility] ENDOSCOPY;  Service: Endoscopy;  Laterality: N/A;   COLONOSCOPY WITH PROPOFOL N/A 11/10/2021   Procedure: COLONOSCOPY WITH PROPOFOL;  Surgeon: Bobby Mood, MD;  Location: [facility] ENDOSCOPY;  Service: Gastroenterology;  Laterality: N/A;   KNEE ARTHROCENTESIS Right    PANCREAS SURGERY  10/2013   PARTIAL GASTRECTOMY     REMOVAL OF  GASTROINTESTINAL STOMATIC  TUMOR OF STOMACH  10/2013   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 If you have any questions, ask your nurse or doctor.          STOP taking these medications    gabapentin 300 MG capsule Commonly known as: NEURONTIN Stopped by: Bobby Altes, MD       TAKE these medications    acetaminophen 500 MG tablet Commonly known as: TYLENOL Take 500 mg by mouth.   amLODipine 5 MG tablet Commonly known as: Norvasc Take 1 tablet (5 mg total) by mouth daily.   atorvastatin 40 MG tablet Commonly known as: LIPITOR TAKE ONE HALF (1/2) A TABLET BY MOUTH DAILY PAIN & TEMPERATURE NEEDS OFFICE VISIT FOR FUTURE REFILLS   clonazePAM 1 MG tablet Commonly known as: KLONOPIN Take 1 mg by mouth 2 (two) times daily.   lidocaine 4 % cream Commonly known as: LMX Apply 1 Application topically as needed.   multivitamin with minerals tablet  Take by mouth.   naproxen sodium 220 MG tablet Commonly known as: ALEVE Take 220 mg by mouth daily as needed. 3 times weekly   olmesartan 40 MG tablet Commonly known as: BENICAR TAKE 1 TABLET DAILY        Allergies:  Allergies  Allergen Reactions   Beta Adrenergic Blockers     Dizziness, low heart rate   Codeine Nausea And Vomiting   Trazodone And Nefazodone Other (See Comments)    insomnia    Family History: Family History  Problem Relation Age of Onset   Hypertension Mother    Lymphoma Mother    Hypertension Father    Valvular heart disease Father    Dementia Father    Stroke Maternal Grandfather    Prostate cancer Paternal Grandfather    Coronary artery disease Sister    Coronary  artery disease Brother    Colon cancer Neg Hx     Social History:  reports that he quit smoking about 16 months ago. His smoking use included cigars. He has never used smokeless tobacco. He reports that he does not drink alcohol and does not use drugs.   Physical Exam: BP 104/66   Pulse 63   Ht 6' (1.829 m)   Wt 180 lb (81.6 kg)   BMI 24.41 kg/m   Constitutional:  Alert and oriented, No acute distress. HEENT: Bobby Little AT GU: Phallus without lesions, mild enlargement of the right hemiscrotum, soft hydrocele present Psychiatric: Normal Little and affect.  Pertinent Imaging:  Ultrasound was performed. It was felt that no significant fluid volume was present.  Assessment & Plan:    1. Small left hydrocele Would not recommend aspiration for this small amount of fluid as it does increase complications Follow up as needed should he note increased size/firmness  I have reviewed the above documentation for accuracy and completeness, and I agree with the above.   Bobby Altes, MD  [facility] [address] [place], [place] [address] [phone]

## 2022-12-21 ENCOUNTER — Other Ambulatory Visit: Payer: Self-pay | Admitting: Family Medicine

## 2022-12-21 DIAGNOSIS — E785 Hyperlipidemia, unspecified: Secondary | ICD-10-CM

## 2022-12-21 NOTE — Telephone Encounter (Signed)
Requested Prescriptions  Refused Prescriptions Disp Refills   atorvastatin (LIPITOR) 40 MG tablet [Pharmacy Med Name: ATORVASTATIN CALCIUM 40MG  TABLET] 45 tablet 0    Sig: TAKE ONE HALF (1/2) A TABLET BY MOUTH DAILY  NEEDS OFFICE VISIT FOR FUTURE REFILLS     Cardiovascular:  Antilipid - Statins Failed - 12/21/2022  4:26 PM      Failed - Lipid Panel in normal range within the last 12 months    Cholesterol, Total  Date Value Ref Range Status  01/12/2022 156 100 - 199 mg/dL Final   LDL Chol Calc (NIH)  Date Value Ref Range Status  01/12/2022 81 0 - 99 mg/dL Final   HDL  Date Value Ref Range Status  01/12/2022 61 >39 mg/dL Final   Triglycerides  Date Value Ref Range Status  01/12/2022 69 0 - 149 mg/dL Final         Passed - Patient is not pregnant      Passed - Valid encounter within last 12 months    Recent Outpatient Visits           4 months ago Essential hypertension   [facility] [place], Marzella Schlein, MD   6 months ago S/P splenectomy   [facility] [place], Marzella Schlein, MD   11 months ago Encounter for annual wellness visit (AWV) in Medicare patient   [facility] [place], Marzella Schlein, MD   1 year ago Swelling of left testicle   [facility] Malva Limes, MD   1 year ago Prediabetes   [place] [place], Marzella Schlein, MD       Future Appointments             In 1 month Bacigalupo, Marzella Schlein, MD [facility], [facility]

## 2022-12-27 ENCOUNTER — Other Ambulatory Visit: Payer: Self-pay | Admitting: Family Medicine

## 2022-12-27 DIAGNOSIS — E1169 Type 2 diabetes mellitus with other specified complication: Secondary | ICD-10-CM

## 2022-12-27 DIAGNOSIS — I10 Essential (primary) hypertension: Secondary | ICD-10-CM

## 2022-12-27 NOTE — Telephone Encounter (Signed)
Unable to refill per protocol, Rx request are too soon.  Requested Prescriptions  Pending Prescriptions Disp Refills   olmesartan (BENICAR) 40 MG tablet 90 tablet 2    Sig: Take 1 tablet (40 mg total) by mouth daily.     Cardiovascular:  Angiotensin Receptor Blockers Failed - 12/27/2022 10:16 AM      Failed - Cr in normal range and within 180 days    Creatinine, Ser  Date Value Ref Range Status  01/12/2022 0.95 0.76 - 1.27 mg/dL Final         Failed - K in normal range and within 180 days    Potassium  Date Value Ref Range Status  01/12/2022 4.7 3.5 - 5.2 mmol/L Final         Passed - Patient is not pregnant      Passed - Last BP in normal range    BP Readings from Last 1 Encounters:  11/29/22 104/66         Passed - Valid encounter within last 6 months    Recent Outpatient Visits           5 months ago Essential hypertension   [facility] [place], Marzella Schlein, MD   6 months ago S/P splenectomy   [facility] [place], Marzella Schlein, MD   11 months ago Encounter for annual wellness visit (AWV) in Medicare patient   [facility] [place], Marzella Schlein, MD   1 year ago Swelling of left testicle   [facility] Malva Limes, MD   1 year ago Prediabetes   [place] [place], Marzella Schlein, MD       Future Appointments             In 1 month Bacigalupo, Marzella Schlein, MD [facility]             atorvastatin (LIPITOR) 40 MG tablet 45 tablet 0     Cardiovascular:  Antilipid - Statins Failed - 12/27/2022 10:16 AM      Failed - Lipid Panel in normal range within the last 12 months    Cholesterol, Total  Date Value Ref Range Status  01/12/2022 156 100 - 199 mg/dL Final   LDL Chol Calc (NIH)  Date Value Ref Range Status  01/12/2022 81 0 - 99 mg/dL Final   HDL  Date Value Ref Range Status  01/12/2022 61 >39  mg/dL Final   Triglycerides  Date Value Ref Range Status  01/12/2022 69 0 - 149 mg/dL Final         Passed - Patient is not pregnant      Passed - Valid encounter within last 12 months    Recent Outpatient Visits           5 months ago Essential hypertension   [facility] [place], Marzella Schlein, MD   6 months ago S/P splenectomy   [facility] [place], Marzella Schlein, MD   11 months ago Encounter for annual wellness visit (AWV) in Medicare patient   [facility] [place], Marzella Schlein, MD   1 year ago Swelling of left testicle   [facility] Malva Limes, MD   1 year ago Prediabetes   [facility] [place], Marzella Schlein, MD       Future Appointments  In 1 month Bacigalupo, Marzella Schlein, MD [facility]

## 2022-12-27 NOTE — Telephone Encounter (Signed)
Medication Refill - Medication: olmesartan (BENICAR) 40 MG tablet and atorvastatin (LIPITOR) 40 MG tablet   Has the patient contacted their pharmacy? Yes.   Pt told to contact provider  Preferred Pharmacy (with phone number or street name):  [facility] - [place], [place] - [address] Phone: [phone]  Fax: [phone]     Has the patient been seen for an appointment in the last year OR does the patient have an upcoming appointment? Yes.    Agent: Please be advised that RX refills may take up to 3 business days. We ask that you follow-up with your pharmacy.

## 2022-12-27 NOTE — Telephone Encounter (Signed)
Unable to refill per protocol, Rx request is too soon. Last refill 10/30/22 for 90 days.  Requested Prescriptions  Pending Prescriptions Disp Refills   atorvastatin (LIPITOR) 40 MG tablet [Pharmacy Med Name: ATORVASTATIN CALCIUM 40MG  TABLET] 45 tablet 0    Sig: TAKE ONE HALF (1/2) A TABLET BY MOUTH DAILY  NEEDS OFFICE VISIT FOR FUTURE REFILLS     Cardiovascular:  Antilipid - Statins Failed - 12/27/2022 10:10 AM      Failed - Lipid Panel in normal range within the last 12 months    Cholesterol, Total  Date Value Ref Range Status  01/12/2022 156 100 - 199 mg/dL Final   LDL Chol Calc (NIH)  Date Value Ref Range Status  01/12/2022 81 0 - 99 mg/dL Final   HDL  Date Value Ref Range Status  01/12/2022 61 >39 mg/dL Final   Triglycerides  Date Value Ref Range Status  01/12/2022 69 0 - 149 mg/dL Final         Passed - Patient is not pregnant      Passed - Valid encounter within last 12 months    Recent Outpatient Visits           5 months ago Essential hypertension   [place] [facility] [place], Marzella Schlein, MD   6 months ago S/P splenectomy   [place] [facility] [place], Marzella Schlein, MD   11 months ago Encounter for annual wellness visit (AWV) in Medicare patient   [facility] [place], Marzella Schlein, MD   1 year ago Swelling of left testicle   [facility] Malva Limes, MD   1 year ago Prediabetes   [place] [facility] [place], Marzella Schlein, MD       Future Appointments             In 1 month Bacigalupo, Marzella Schlein, MD [facility]

## 2023-01-01 ENCOUNTER — Telehealth: Payer: Self-pay | Admitting: *Deleted

## 2023-01-01 ENCOUNTER — Other Ambulatory Visit: Payer: Self-pay | Admitting: Family Medicine

## 2023-01-01 DIAGNOSIS — E785 Hyperlipidemia, unspecified: Secondary | ICD-10-CM

## 2023-01-01 DIAGNOSIS — I10 Essential (primary) hypertension: Secondary | ICD-10-CM

## 2023-01-01 MED ORDER — ATORVASTATIN CALCIUM 40 MG PO TABS
40.0000 mg | ORAL_TABLET | Freq: Every day | ORAL | 0 refills | Status: DC
Start: 2023-01-01 — End: 2023-01-01

## 2023-01-01 MED ORDER — ATORVASTATIN CALCIUM 40 MG PO TABS
40.0000 mg | ORAL_TABLET | Freq: Every day | ORAL | 2 refills | Status: DC
Start: 2023-01-01 — End: 2023-02-01

## 2023-01-01 NOTE — Telephone Encounter (Signed)
Received call from pharmacy- patient is not sure why his Rx is being denied- with some review- the olmesartin Rx was originaly sent to mail order pharmacy. Patient confirms he wants that sent to local pharmacy- so I can correct that and send remaining RF to local pharmacy

## 2023-01-01 NOTE — Addendum Note (Signed)
Addended by: Hyacinth Meeker on: 01/01/2023 10:12 AM   Modules accepted: Orders

## 2023-01-01 NOTE — Telephone Encounter (Signed)
Change in pharmacy- appointment scheduled 02/01/23 Requested Prescriptions  Pending Prescriptions Disp Refills   olmesartan (BENICAR) 40 MG tablet [Pharmacy Med Name: OLMESARTAN MEDOXOMIL 40MG  TABLET] 90 tablet 2    Sig: TAKE ONE TABLET BY MOUTH ONCE A DAY     Cardiovascular:  Angiotensin Receptor Blockers Failed - 01/01/2023  9:34 AM      Failed - Cr in normal range and within 180 days    Creatinine, Ser  Date Value Ref Range Status  01/12/2022 0.95 0.76 - 1.27 mg/dL Final         Failed - K in normal range and within 180 days    Potassium  Date Value Ref Range Status  01/12/2022 4.7 3.5 - 5.2 mmol/L Final         Passed - Patient is not pregnant      Passed - Last BP in normal range    BP Readings from Last 1 Encounters:  11/29/22 104/66         Passed - Valid encounter within last 6 months    Recent Outpatient Visits           5 months ago Essential hypertension   [place] [facility] [place], Marzella Schlein, MD   7 months ago S/P splenectomy   [place] [place], Marzella Schlein, MD   11 months ago Encounter for annual wellness visit (AWV) in Medicare patient   [facility] [place], Marzella Schlein, MD   1 year ago Swelling of left testicle   [facility] Malva Limes, MD   1 year ago Prediabetes   [place] [facility] [place], Marzella Schlein, MD       Future Appointments             In 1 month Bacigalupo, Marzella Schlein, MD [facility]

## 2023-01-01 NOTE — Telephone Encounter (Signed)
Bobby Little Pharmacy: Patient is upset because his refills have been denied: Olmesartan and atorvastatin. Review of Rx- olmesartan:  original Rx sent  to mail order-remaining Rx can be sent to local Rx- that                            is done                        Atorvastatin: Patient states he is taking 1 pill daily and provider is aware-                              the direction on Rx need to be changed by provider   Please review Rx for atorvastatin directions and change so patient can get Rx. He states he has 3 pills left

## 2023-01-10 ENCOUNTER — Telehealth: Payer: Self-pay | Admitting: Family Medicine

## 2023-01-10 ENCOUNTER — Encounter: Payer: Self-pay | Admitting: Family Medicine

## 2023-01-10 NOTE — Telephone Encounter (Signed)
Contacted Bobby Little to schedule their annual wellness visit. Patient declined to schedule AWV at this time.  Patient said AWV isn't necessary.   Thank you,  [facility] Support [facility] Direct dial  [phone]

## 2023-01-15 ENCOUNTER — Encounter: Payer: Self-pay | Admitting: Family Medicine

## 2023-02-01 ENCOUNTER — Encounter: Payer: Self-pay | Admitting: Family Medicine

## 2023-02-01 ENCOUNTER — Ambulatory Visit (INDEPENDENT_AMBULATORY_CARE_PROVIDER_SITE_OTHER): Payer: Medicare Other | Admitting: Family Medicine

## 2023-02-01 VITALS — BP 105/78 | HR 62 | Ht 72.0 in | Wt 187.0 lb

## 2023-02-01 DIAGNOSIS — Z125 Encounter for screening for malignant neoplasm of prostate: Secondary | ICD-10-CM | POA: Diagnosis not present

## 2023-02-01 DIAGNOSIS — R350 Frequency of micturition: Secondary | ICD-10-CM | POA: Diagnosis not present

## 2023-02-01 DIAGNOSIS — I10 Essential (primary) hypertension: Secondary | ICD-10-CM | POA: Diagnosis not present

## 2023-02-01 DIAGNOSIS — R3911 Hesitancy of micturition: Secondary | ICD-10-CM

## 2023-02-01 DIAGNOSIS — E7800 Pure hypercholesterolemia, unspecified: Secondary | ICD-10-CM

## 2023-02-01 DIAGNOSIS — N401 Enlarged prostate with lower urinary tract symptoms: Secondary | ICD-10-CM

## 2023-02-01 DIAGNOSIS — C49A2 Gastrointestinal stromal tumor of stomach: Secondary | ICD-10-CM

## 2023-02-01 DIAGNOSIS — R7303 Prediabetes: Secondary | ICD-10-CM

## 2023-02-01 LAB — POCT URINALYSIS DIPSTICK
Bilirubin, UA: NEGATIVE
Blood, UA: NEGATIVE
Glucose, UA: NEGATIVE
Ketones, UA: NEGATIVE
Leukocytes, UA: NEGATIVE
Nitrite, UA: NEGATIVE
Protein, UA: NEGATIVE
Spec Grav, UA: 1.01 (ref 1.010–1.025)
Urobilinogen, UA: 0.2 E.U./dL
pH, UA: 6 (ref 5.0–8.0)

## 2023-02-01 MED ORDER — AMLODIPINE BESYLATE 5 MG PO TABS
5.0000 mg | ORAL_TABLET | Freq: Every day | ORAL | 3 refills | Status: DC
Start: 2023-02-01 — End: 2024-01-15

## 2023-02-01 MED ORDER — ATORVASTATIN CALCIUM 40 MG PO TABS
40.0000 mg | ORAL_TABLET | Freq: Every day | ORAL | 2 refills | Status: DC
Start: 2023-02-01 — End: 2024-01-15

## 2023-02-01 MED ORDER — OLMESARTAN MEDOXOMIL 40 MG PO TABS
40.0000 mg | ORAL_TABLET | Freq: Every day | ORAL | 0 refills | Status: DC
Start: 2023-02-01 — End: 2023-07-01

## 2023-02-01 NOTE — Assessment & Plan Note (Signed)
Well controlled on current regimen of Benicar 40mg  daily and Norvasc 5mg  daily. -Continue current medication regimen.

## 2023-02-01 NOTE — Assessment & Plan Note (Signed)
Recommend low carb diet °Recheck A1c  °

## 2023-02-01 NOTE — Assessment & Plan Note (Signed)
Frequent urination, interrupted stream, and discomfort in the groin area. No signs of infection in the urine sample. -Start Flomax to alleviate symptoms.

## 2023-02-01 NOTE — Assessment & Plan Note (Signed)
Previously well controlled Continue statin Repeat FLP and CMP  

## 2023-02-01 NOTE — Assessment & Plan Note (Addendum)
Previously followed by GI Stable at this time Encourage GI f/u

## 2023-02-01 NOTE — Progress Notes (Signed)
Established Patient Office Visit  Subjective   Patient ID: Bobby Little, male    DOB: 05/25/1946  Age: 77 y.o. MRN: [id number]  Chief Complaint  Patient presents with   Annual Exam    HPI Discussed the use of AI scribe software for clinical note transcription with the patient, who gave verbal consent to proceed.  History of Present Illness   The patient, with a history of hydrocele  and arthroscopic knee surgery, presents with multiple complaints. The chief complaint is pain ranging from the lower abdomen to the feet. The patient also reports frequent urination, up to 15 times a day, but does not have to get up at night to urinate. The patient describes the sensation as feeling like they've been "kicked in the crotch by a gorilla".  The patient also reports rectal pain, especially when needing to have a bowel movement. The patient had a history of constipation, which has improved significantly since the installation of two bidets in their home. The patient now reports having bowel movements twice a day, which are not hard to pass.  The patient also reports back pain, which they believe is contributing to their other symptoms. The patient mentions that they have had shots in their back from multiple doctors, but nothing has helped. The patient also mentions experiencing pain in their right knee, which has had arthroscopic surgery in the past. The patient reports numbness in the right foot, which is alleviated by wearing a leg brace.  The patient also expresses concerns about prostate problems. The patient reports that their urine stream will start and then stop, and then start again.         ROS per HPI    Objective:     BP 105/78 (BP Location: Right Arm, Patient Position: Sitting, Cuff Size: Normal)   Pulse 62   Ht 6' (1.829 m)   Wt 187 lb (84.8 kg)   SpO2 100%   BMI 25.36 kg/m    Physical Exam Vitals reviewed.  Constitutional:      General: He is not in acute  distress.    Appearance: Normal appearance. He is not diaphoretic.  HENT:     Head: Normocephalic and atraumatic.  Eyes:     General: No scleral icterus.    Conjunctiva/sclera: Conjunctivae normal.  Cardiovascular:     Rate and Rhythm: Normal rate and regular rhythm.     Heart sounds: Normal heart sounds. No murmur heard. Pulmonary:     Effort: Pulmonary effort is normal. No respiratory distress.     Breath sounds: Normal breath sounds. No wheezing or rhonchi.  Abdominal:     General: Bowel sounds are normal. There is no distension.     Palpations: Abdomen is soft.     Tenderness: There is abdominal tenderness (generalized, mild).  Musculoskeletal:     Cervical back: Neck supple.     Right lower leg: No edema.     Left lower leg: No edema.  Lymphadenopathy:     Cervical: No cervical adenopathy.  Skin:    General: Skin is warm and dry.     Findings: No rash.  Neurological:     Mental Status: He is alert and oriented to person, place, and time. Mental status is at baseline.      No results found for any visits on 02/01/23.    The 10-year ASCVD risk score (Arnett DK, et al., 2019) is: 36%    Assessment & Plan:   Problem List Items Addressed  This Visit       Cardiovascular and Mediastinum   Essential hypertension - Primary    Well controlled on current regimen of Benicar 40mg  daily and Norvasc 5mg  daily. -Continue current medication regimen.      Relevant Medications   amLODipine (NORVASC) 5 MG tablet   atorvastatin (LIPITOR) 40 MG tablet   olmesartan (BENICAR) 40 MG tablet   Other Relevant Orders   Comprehensive metabolic panel     Digestive   GIST, malignant (HCC)    Previously followed by GI Stable at this time Encourage GI f/u        Genitourinary   Benign prostatic hyperplasia with urinary hesitancy    Frequent urination, interrupted stream, and discomfort in the groin area. No signs of infection in the urine sample. -Start Flomax to alleviate  symptoms.      Relevant Orders   PSA Total (Reflex To Free)     Other   Prediabetes    Recommend low carb diet Recheck A1c       Relevant Orders   Hemoglobin A1c   Hyperlipidemia    Previously well controlled Continue statin Repeat FLP and CMP      Relevant Medications   amLODipine (NORVASC) 5 MG tablet   atorvastatin (LIPITOR) 40 MG tablet   olmesartan (BENICAR) 40 MG tablet   Other Relevant Orders   Comprehensive metabolic panel   Lipid panel   Other Visit Diagnoses     Prostate cancer screening       Relevant Orders   PSA Total (Reflex To Free)   Urinary frequency       Relevant Orders   POCT Urinalysis Dipstick          Chronic Back Pain: Exacerbated by physical activity such as using a weed eater. -Continue current management plan.  Constipation: Resolved with the use of bidets. -No changes to current management plan.  Hyperlipidemia: Managed with Atorvastatin. -Continue current medication regimen. -Check cholesterol levels with today's labs.  General Health Maintenance: -Check kidney and liver function, cholesterol, A1c, and PSA with today's labs. -Schedule wellness visit over the phone to review screenings and other preventive measures. -Consider follow-up with GI for ongoing abdominal symptoms. -Return in six months for routine follow-up, or sooner if issues with Flomax or worsening symptoms.        Return in about 6 months (around 08/04/2023) for chronic disease f/u, also schedule AWV, with [facility].    Shirlee Latch, MD

## 2023-02-02 LAB — COMPREHENSIVE METABOLIC PANEL
ALT: 20 IU/L (ref 0–44)
AST: 23 IU/L (ref 0–40)
Albumin: 4.6 g/dL (ref 3.8–4.8)
Alkaline Phosphatase: 114 IU/L (ref 44–121)
BUN/Creatinine Ratio: 19 (ref 10–24)
BUN: 19 mg/dL (ref 8–27)
Bilirubin Total: 0.5 mg/dL (ref 0.0–1.2)
CO2: 21 mmol/L (ref 20–29)
Calcium: 9.6 mg/dL (ref 8.6–10.2)
Chloride: 100 mmol/L (ref 96–106)
Creatinine, Ser: 0.99 mg/dL (ref 0.76–1.27)
Globulin, Total: 2.5 g/dL (ref 1.5–4.5)
Glucose: 102 mg/dL — ABNORMAL HIGH (ref 70–99)
Potassium: 5.1 mmol/L (ref 3.5–5.2)
Sodium: 137 mmol/L (ref 134–144)
Total Protein: 7.1 g/dL (ref 6.0–8.5)
eGFR: 79 mL/min/{1.73_m2} (ref >59)

## 2023-02-02 LAB — LIPID PANEL
Chol/HDL Ratio: 2.8 ratio (ref 0.0–5.0)
Cholesterol, Total: 157 mg/dL (ref 100–199)
HDL: 57 mg/dL (ref >39)
LDL Chol Calc (NIH): 82 mg/dL (ref 0–99)
Triglycerides: 99 mg/dL (ref 0–149)
VLDL Cholesterol Cal: 18 mg/dL (ref 5–40)

## 2023-02-02 LAB — PSA TOTAL (REFLEX TO FREE): Prostate Specific Ag, Serum: 0.4 ng/mL (ref 0.0–4.0)

## 2023-02-02 LAB — HEMOGLOBIN A1C
Est. average glucose Bld gHb Est-mCnc: 117 mg/dL
Hgb A1c MFr Bld: 5.7 % — ABNORMAL HIGH (ref 4.8–5.6)

## 2023-02-04 ENCOUNTER — Encounter: Payer: Self-pay | Admitting: Family Medicine

## 2023-03-06 ENCOUNTER — Encounter: Payer: Self-pay | Admitting: Family Medicine

## 2023-03-20 ENCOUNTER — Encounter: Payer: Self-pay | Admitting: Family Medicine

## 2023-03-20 DIAGNOSIS — M25551 Pain in right hip: Secondary | ICD-10-CM

## 2023-03-28 NOTE — Telephone Encounter (Signed)
Please place sports med referral - put Joseph Berkshire in the comments.

## 2023-04-03 ENCOUNTER — Encounter: Payer: Medicare Other | Admitting: Family Medicine

## 2023-04-04 ENCOUNTER — Encounter: Payer: Self-pay | Admitting: Family Medicine

## 2023-04-04 ENCOUNTER — Ambulatory Visit (INDEPENDENT_AMBULATORY_CARE_PROVIDER_SITE_OTHER): Payer: Medicare Other | Admitting: Family Medicine

## 2023-04-04 VITALS — BP 114/78 | HR 80 | Ht 72.0 in | Wt 186.0 lb

## 2023-04-04 DIAGNOSIS — G894 Chronic pain syndrome: Secondary | ICD-10-CM | POA: Diagnosis not present

## 2023-04-04 DIAGNOSIS — Z23 Encounter for immunization: Secondary | ICD-10-CM | POA: Diagnosis not present

## 2023-04-04 MED ORDER — DULOXETINE HCL 30 MG PO CPEP: ORAL_CAPSULE | ORAL | 0 refills | Status: DC

## 2023-04-04 NOTE — Assessment & Plan Note (Addendum)
Patient presents for evaluation of chronic low back and left hip pain, this is in the setting of multilevel degenerative changes throughout the lumbar spine, SI joint, and less so to the bilateral hip joints. Four x-rays from 09/05/2020 were reviewed in regards to this.  Of note, he describes prior sacroiliac joint steroid & bilateral piriformis trigger point injections in the past, most recently 04/04/2022 through Dr. Garnett [place] office.  He states that these injections did provide near total symptom control, though had pain recur within days and at a worsened severity.  He is interested in alternate/adjunct treatment options.  Examination demonstrates focal tenderness at the left SI joint, positive Kemps test, positive FADIR on the left, equivocal seated straight leg raise.  He describes the pain that most closely approximates his symptoms to be stemming from the left SI joint exam.  Patient with chronic musculoskeletal pain in the setting of multiple related sites of degeneration involving the lumbosacral, sacroiliac, and left hip joints.  We extensively reviewed both nonpharmacologic, pharmacologic, and interventional treatment strategies.  Shared medical decision making with the following plan:  - Start duloxetine - Titration guidelines reviewed - Assess response at 4-6 weeks - Can revisit local injection(s) if amenable - Other non-surgical option to consider include formal PT, discuss alternate pain management options with his current pain management group

## 2023-04-04 NOTE — Assessment & Plan Note (Signed)
Flu vaccine administered today.

## 2023-04-09 ENCOUNTER — Encounter: Payer: Self-pay | Admitting: Family Medicine

## 2023-04-09 NOTE — Telephone Encounter (Signed)
Please review.  KP

## 2023-04-16 NOTE — Progress Notes (Signed)
Primary Care / Sports Medicine Office Visit  Patient Information:  Patient ID: Bobby Little, male DOB: October 04, 1945 Age: 77 y.o. MRN: [id number]   Bobby Little is a pleasant 77 y.o. male presenting with the following:  Chief Complaint  Patient presents with   Hip Pain    Has had many many injections in back but not in him.     Vitals:   04/04/23 0933  BP: 114/78  Pulse: 80  SpO2: 98%   Vitals:   04/04/23 0933  Weight: 186 lb (84.4 kg)  Height: 6' (1.829 m)   Body mass index is 25.23 kg/m.  No results found.   Independent interpretation of notes and tests performed by another provider:   None  Procedures performed:   None  Pertinent History, Exam, Impression, and Recommendations:   Problem List Items Addressed This Visit       Other   Need for influenza vaccination    Flu vaccine administered today      Relevant Orders   Flu Vaccine Trivalent High Dose (Fluad) (Completed)   Chronic pain syndrome - Primary    Patient presents for evaluation of chronic low back and left hip pain, this is in the setting of multilevel degenerative changes throughout the lumbar spine, SI joint, and less so to the bilateral hip joints. Four x-rays from 09/05/2020 were reviewed in regards to this.  Of note, he describes prior sacroiliac joint steroid & bilateral piriformis trigger point injections in the past, most recently 04/04/2022 through Dr. Garnett Farm office.  He states that these injections did provide near total symptom control, though had pain recur within days and at a worsened severity.  He is interested in alternate/adjunct treatment options.  Examination demonstrates focal tenderness at the left SI joint, positive Kemps test, positive FADIR on the left, equivocal seated straight leg raise.  He describes the pain that most closely approximates his symptoms to be stemming from the left SI joint exam.  Patient with chronic musculoskeletal pain in the setting of multiple  related sites of degeneration involving the lumbosacral, sacroiliac, and left hip joints.  We extensively reviewed both nonpharmacologic, pharmacologic, and interventional treatment strategies.  Shared medical decision making with the following plan:  - Start duloxetine - Titration guidelines reviewed - Assess response at 4-6 weeks - Can revisit local injection(s) if amenable - Other non-surgical option to consider include formal PT, discuss alternate pain management options with his current pain management group       Relevant Medications   DULoxetine (CYMBALTA) 30 MG capsule     Orders & Medications Medications:  Meds ordered this encounter  Medications   DULoxetine (CYMBALTA) 30 MG capsule    Sig: Take 1 capsule (30 mg total) by mouth every evening for 7 days, THEN 2 capsules (60 mg total) every evening.    Dispense:  90 capsule    Refill:  0   Orders Placed This Encounter  Procedures   Flu Vaccine Trivalent High Dose (Fluad)     No follow-ups on file.     Jerrol Banana, MD, [facility]   Primary Care Sports Medicine Primary Care and Sports Medicine at [facility]

## 2023-04-18 NOTE — Telephone Encounter (Signed)
Please advise 

## 2023-04-23 ENCOUNTER — Encounter: Payer: Self-pay | Admitting: Family Medicine

## 2023-04-30 NOTE — Telephone Encounter (Signed)
Please contact patient to schedule appt - me or any provider with openings

## 2023-05-02 ENCOUNTER — Encounter: Payer: Self-pay | Admitting: Family Medicine

## 2023-05-02 ENCOUNTER — Ambulatory Visit (INDEPENDENT_AMBULATORY_CARE_PROVIDER_SITE_OTHER): Payer: Medicare Other | Admitting: Family Medicine

## 2023-05-02 VITALS — BP 128/88 | HR 59 | Temp 97.4°F | Resp 20 | Ht 72.0 in | Wt 189.0 lb

## 2023-05-02 DIAGNOSIS — R3911 Hesitancy of micturition: Secondary | ICD-10-CM

## 2023-05-02 DIAGNOSIS — R1030 Lower abdominal pain, unspecified: Secondary | ICD-10-CM | POA: Diagnosis not present

## 2023-05-02 DIAGNOSIS — C49A2 Gastrointestinal stromal tumor of stomach: Secondary | ICD-10-CM

## 2023-05-02 DIAGNOSIS — D3A8 Other benign neuroendocrine tumors: Secondary | ICD-10-CM

## 2023-05-02 DIAGNOSIS — N401 Enlarged prostate with lower urinary tract symptoms: Secondary | ICD-10-CM

## 2023-05-02 DIAGNOSIS — K5904 Chronic idiopathic constipation: Secondary | ICD-10-CM

## 2023-05-02 DIAGNOSIS — J0100 Acute maxillary sinusitis, unspecified: Secondary | ICD-10-CM

## 2023-05-02 MED ORDER — AMOXICILLIN-POT CLAVULANATE 875-125 MG PO TABS: 1.0000 | ORAL_TABLET | Freq: Two times a day (BID) | ORAL | 0 refills | Status: AC

## 2023-05-02 MED ORDER — TAMSULOSIN HCL 0.4 MG PO CAPS: 0.4000 mg | ORAL_CAPSULE | Freq: Every day | ORAL | 3 refills | Status: DC

## 2023-05-02 MED ORDER — DICYCLOMINE HCL 10 MG PO CAPS: 10.0000 mg | ORAL_CAPSULE | Freq: Three times a day (TID) | ORAL | 1 refills | Status: DC | PRN
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
## 2023-05-13 ENCOUNTER — Encounter: Payer: Self-pay | Admitting: Family Medicine

## 2023-05-14 ENCOUNTER — Ambulatory Visit
Admission: RE | Admit: 2023-05-14 | Discharge: 2023-05-14 | Disposition: A | Discharge status: Home / Self Care | Payer: Medicare Other | Source: Ambulatory Visit | Attending: Family Medicine | Admitting: Family Medicine

## 2023-05-14 DIAGNOSIS — R1030 Lower abdominal pain, unspecified: Secondary | ICD-10-CM | POA: Diagnosis present

## 2023-05-14 DIAGNOSIS — D3A8 Other benign neuroendocrine tumors: Secondary | ICD-10-CM | POA: Diagnosis present

## 2023-05-14 DIAGNOSIS — C49A2 Gastrointestinal stromal tumor of stomach: Secondary | ICD-10-CM | POA: Insufficient documentation

## 2023-05-14 MED ORDER — IOHEXOL 300 MG/ML  SOLN
100.0000 mL | Freq: Once | INTRAMUSCULAR | Status: AC | PRN
  Administered 2023-05-14: 100 mL via INTRAVENOUS

## 2023-05-23 ENCOUNTER — Ambulatory Visit: Payer: Medicare Other | Admitting: Family Medicine

## 2023-05-24 ENCOUNTER — Encounter: Payer: Self-pay | Admitting: Family Medicine

## 2023-06-06 ENCOUNTER — Encounter: Payer: Self-pay | Admitting: Family Medicine

## 2023-06-10 NOTE — Telephone Encounter (Signed)
See result note re CT scan.

## 2023-06-24 ENCOUNTER — Encounter: Payer: Self-pay | Admitting: Family Medicine

## 2023-06-24 DIAGNOSIS — K5904 Chronic idiopathic constipation: Secondary | ICD-10-CM

## 2023-06-28 NOTE — Telephone Encounter (Signed)
Ok to place referral to [facility] and let him know that we have placed it. USe constipation diagnosis.

## 2023-06-29 ENCOUNTER — Other Ambulatory Visit: Payer: Self-pay | Admitting: Family Medicine

## 2023-06-29 DIAGNOSIS — I10 Essential (primary) hypertension: Secondary | ICD-10-CM

## 2023-07-15 ENCOUNTER — Ambulatory Visit: Payer: Self-pay

## 2023-07-15 NOTE — Telephone Encounter (Signed)
Ok to use suppository as needed, would recommend also using miralax twice daily along with Senna  Ok to follow up with GI prior to PCP follow up appt

## 2023-07-15 NOTE — Telephone Encounter (Signed)
  Chief Complaint: constipation Symptoms: chronic constipation Frequency: 1 year  Pertinent Negatives: NA Disposition: [] ED /[] Urgent Care (no appt availability in office) / [] Appointment(In office/virtual)/ []  [facility]/ [] Home Care/ [] Refused Recommended Disposition /[] [facility]/ [x]  Follow-up with PCP Additional Notes: pt states he has been dealing with constipation for 1 year now. He has been using Miralax and enema but getting where that is ineffective. He has been eating high fiber foods and prune juice and used his back brace this morning and has some improvement. He has been able to have 2 small BM so far. He has appt with Dr. Tobi Bastos in 08/2023. He asked about dicyclomine could be causing constipation since he hasn't taken any in 2-3 days and noticed improvement. I recommended he try to without it and continue with improvement, also recommended care advice like Miralax or prune juice daily to help with getting in  routine and increasing high fiber foods. Pt wants to wait and see Dr. Tobi Bastos before he comes in. Offered to schedule sooner appt but pt wanted to r/s PCP from 07/2023 to 09/2023 so he can see Dr. Tobi Bastos first. Pt verbalized understanding of care advice and will call back if any continued or worsening sx.   Summary: Constipatio - medication inquiry   Pt complaining of constipation, pt states this has been doing on for a year now. Pt has been using OTC miralax and that would help soften up his stool. Pt states the last time constipation happened he used miralax for 6 days and had to do an enema on self and that helped. Pt inquiring if okay to use suppositories to assist with constipation.  Pt seeking clinical advice.         Reason for Disposition  Constipation is a chronic symptom (recurrent or ongoing AND present > 4 weeks)  Answer Assessment - Initial Assessment Questions 1. STOOL PATTERN OR FREQUENCY: "How often do you have a bowel movement (BM)?"  (Normal  range: 3 times a day to every 3 days)  "When was your last BM?"       Has constipation on and off  2. STRAINING: "Do you have to strain to have a BM?"      At times yes 3. RECTAL PAIN: "Does your rectum hurt when the stool comes out?" If Yes, ask: "Do you have hemorrhoids? How bad is the pain?"  (Scale 1-10; or mild, moderate, severe)     At times  4. STOOL COMPOSITION: "Are the stools hard?"      At times  6. CHRONIC CONSTIPATION: "Is this a new problem for you?"  If No, ask: "How long have you had this problem?" (days, weeks, months)      1 year, has GI  9. LAXATIVES: "Have you been using any stool softeners, laxatives, or enemas?"  If Yes, ask "What, how often, and when was the last time?"     Has used Miralax and enema  11. CAUSE: "What do you think is causing the constipation?"        Chronic constipation 12. OTHER SYMPTOMS: "Do you have any other symptoms?" (e.g., abdomen pain, bloating, fever, vomiting)       Bloating  Protocols used: Constipation-A-AH

## 2023-07-15 NOTE — Telephone Encounter (Signed)
Attempted to call patient. No answer. LM for him to return call to office.  PEC you may advise of Dr. Danella Penton recommendation below.

## 2023-07-23 ENCOUNTER — Encounter: Payer: Self-pay | Admitting: Family Medicine

## 2023-07-29 ENCOUNTER — Encounter: Payer: Self-pay | Admitting: Family Medicine

## 2023-07-30 ENCOUNTER — Encounter: Payer: Self-pay | Admitting: Gastroenterology

## 2023-07-31 NOTE — Telephone Encounter (Signed)
 Spoke with Bobby Little, they are having to switch providers due to insurance change. I told him that both he and [name] would need to come by the office to sign a release to transfer records.

## 2023-08-01 MED ORDER — LINACLOTIDE 290 MCG PO CAPS: 290.0000 ug | ORAL_CAPSULE | Freq: Every day | ORAL | Status: DC

## 2023-08-01 NOTE — Telephone Encounter (Signed)
 I spoke to pt regarding this via telephone, I looked up all Rx with Linzess and they all say there is no risk... Pt is aware... Nothing further needed at this time

## 2023-08-05 ENCOUNTER — Other Ambulatory Visit: Payer: Self-pay

## 2023-08-05 MED ORDER — LINACLOTIDE 145 MCG PO CAPS: 145.0000 ug | ORAL_CAPSULE | Freq: Every day | ORAL | 3 refills | Status: DC

## 2023-08-09 ENCOUNTER — Ambulatory Visit: Payer: Medicare Other | Admitting: Family Medicine

## 2023-09-10 ENCOUNTER — Ambulatory Visit (INDEPENDENT_AMBULATORY_CARE_PROVIDER_SITE_OTHER): Payer: Medicare Other | Admitting: Gastroenterology

## 2023-09-10 VITALS — BP 129/77 | HR 67 | Temp 98.3°F | Wt 192.2 lb

## 2023-09-10 DIAGNOSIS — K581 Irritable bowel syndrome with constipation: Secondary | ICD-10-CM

## 2023-09-10 NOTE — Progress Notes (Signed)
 Wyline Mood MD, [facility](U.K) [address]  [address]  [place], [place] [address]  Main: [phone]  Fax: [phone]   Gastroenterology Consultation  Referring Provider:     Erasmo Downer, MD Primary Care Physician:  Erasmo Downer, MD Primary Gastroenterologist:  Dr. Wyline Mood  Reason for Consultation:     Constipation         HPI:   Bobby Little is a 78 y.o. y/o male referred for consultation & management  by Dr. Beryle Flock, Marzella Schlein, MD.   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 since then had been in remission. He was last seen by Dr Smith Robert in Oncology back in 03/2018 and at that time plan was to get surveillance scans once a year and see him back in 2020 . 01/28/2020: CT scan of the abdomen and pelvis with contrast shows sigmoid diverticulosis no recurrence of tumor.    I performed his colonoscopy back in 2018 which was normal.   He has been referred for constipation. Last seen back in 01/2020 for constipation and treated with miralax.  11/10/2021: colonoscopy : 1 polyp 7 mm resected. Diverticulosis of the sigmoid colon . Tubular adenoma.   05/14/2023: CT abdomen : Stool throughout the colon : called out office and commenced on linzess - caused diarrhea- chaanged to miralax.    Interval history    He says that the MiraLAX as well as Linzess caused him to have diarrhea which he stopped taking for his constipation.  Since then he has been taking prune juice and eating prunes as needed which cause him to have a good satisfactory bowel movement.  No other complaints.  Past Medical History:  Diagnosis Date   Anxiety    x 20 y per pt   Arthritis    Degenerative disc disease, lumbar    Depression    Diverticulitis    Family history of polyps in the colon    Gastrointestinal stromal  tumor (GIST) (HCC)    GIST (gastrointestinal stroma tumor), malignant, colon (HCC)    GIST (gastrointestinal stroma tumor), malignant, colon (HCC)    Hay fever/allergies    Hepatic cyst    History of spinal fusion for scoliosis    Hypertension    Polycystic kidney    Prediabetes    Spinal stenosis of lumbar region     Past Surgical History:  Procedure Laterality Date   COLONOSCOPY WITH PROPOFOL N/A 11/22/2016   Procedure: COLONOSCOPY WITH PROPOFOL;  Surgeon: Wyline Mood, MD;  Location: [facility] ENDOSCOPY;  Service: Endoscopy;  Laterality: N/A;   COLONOSCOPY WITH PROPOFOL N/A 11/10/2021   Procedure: COLONOSCOPY WITH PROPOFOL;  Surgeon: Wyline Mood, MD;  Location[facility] ENDOSCOPY;  Service: Gastroenterology;  Laterality: N/A;   KNEE ARTHROCENTESIS Right    PANCREAS SURGERY  10/2013   PARTIAL GASTRECTOMY     REMOVAL OF GASTROINTESTINAL STOMATIC  TUMOR OF STOMACH  10/2013   SPLENECTOMY  10/2013   TUMOR REMOVAL     stomach, slpeen, and pancreas    Prior to Admission medications   Medication Sig Start Date End Date Taking? Authorizing Provider  acetaminophen (TYLENOL) 500 MG tablet Take 500 mg by mouth.    [provider]  amLODipine (NORVASC) 5 MG tablet Take 1 tablet (5 mg total) by mouth daily. 02/01/23   Erasmo Downer, MD  atorvastatin (  LIPITOR) 40 MG tablet Take 1 tablet (40 mg total) by mouth daily. 02/01/23   Bacigalupo, Marzella Schlein, MD  clonazePAM (KLONOPIN) 1 MG tablet Take 1 mg by mouth 2 (two) times daily.    [provider]  dicyclomine (BENTYL) 10 MG capsule Take 1 capsule (10 mg total) by mouth 3 (three) times daily with meals as needed for spasms. 05/02/23   Erasmo Downer, MD  lidocaine (LMX) 4 % cream Apply 1 Application topically as needed.    [provider]  linaclotide Karlene Einstein) 145 MCG CAPS capsule Take 1 capsule (145 mcg total) by mouth daily before breakfast. 08/05/23   Wyline Mood, MD  Multiple Vitamins-Minerals (MULTIVITAMIN WITH  MINERALS) tablet Take by mouth.    [provider]  naproxen sodium (ALEVE) 220 MG tablet Take 220 mg by mouth daily as needed. 3 times weekly    [provider]  olmesartan (BENICAR) 40 MG tablet TAKE ONE TABLET (40 MG TOTAL) BY MOUTH DAILY. 07/01/23   Erasmo Downer, MD  tamsulosin (FLOMAX) 0.4 MG CAPS capsule Take 1 capsule (0.4 mg total) by mouth daily. 05/02/23   Erasmo Downer, MD    Family History  Problem Relation Age of Onset   Hypertension Mother    Lymphoma Mother    Hypertension Father    Valvular heart disease Father    Dementia Father    Stroke Maternal Grandfather    Prostate cancer Paternal Grandfather    Coronary artery disease Sister    Coronary artery disease Brother    Colon cancer Neg Hx      Social History   Tobacco Use   Smoking status: Former    Types: Cigars    Quit date: 07/29/2021    Years since quitting: 2.1   Smokeless tobacco: Never   Tobacco comments:    1 cigar 3-4 times year  Vaping Use   Vaping status: Never Used  Substance Use Topics   Alcohol use: No   Drug use: No    Allergies as of 09/10/2023 - Review Complete 05/02/2023  Allergen Reaction Noted   Beta adrenergic blockers  10/10/2016   Codeine Nausea And Vomiting    Trazodone and nefazodone Other (See Comments) 04/04/2017    Review of Systems:    All systems reviewed and negative except where noted in HPI.   Physical Exam:  There were no vitals taken for this visit. No LMP for male patient. Psych:  Alert and cooperative. Normal mood and affect. General:   Alert,  Well-developed, well-nourished, pleasant and cooperative in NAD Head:  Normocephalic and atraumatic. Eyes:  Sclera clear, no icterus.   Conjunctiva pink.  Abdomen: Left upper quadrant prior surgical scar possibly a small hernia at the incision site normal bowel sounds.  No bruits.  Soft, non-tender and non-distended without masses, hepatosplenomegaly or hernias noted.  No guarding or  rebound tenderness.    Neurologic:  Alert and oriented x3;  grossly normal neurologically. Psych:  Alert and cooperative. Normal mood and affect.  Imaging Studies: No results found.  Assessment and Plan:   Bobby Little is a 78 y.o. y/o male is here today to follow-up for IBS constipation.  Did not tolerate Linzess or MiraLAX caused him to have diarrhea.  Advised him to stop the dicyclomine and continue prunes which have worked very well for him.  I advised him if the constipation returns despite the use of prunes can commence on half a capful or even quarter capful of MiraLAX  on a daily basis at what ever dose that gives him a satisfactory/adequate bowel movement.  Obviously a higher fiber content would be beneficial if tolerated  Follow up in as needed  Dr Wyline Mood MD,MRCP(U.K)

## 2023-09-23 ENCOUNTER — Ambulatory Visit: Payer: Self-pay | Admitting: Family Medicine

## 2023-09-27 ENCOUNTER — Ambulatory Visit (INDEPENDENT_AMBULATORY_CARE_PROVIDER_SITE_OTHER): Payer: Medicare Other | Admitting: Family Medicine

## 2023-09-27 ENCOUNTER — Encounter: Payer: Self-pay | Admitting: Family Medicine

## 2023-09-27 VITALS — BP 120/78 | HR 69 | Ht 72.0 in | Wt 193.8 lb

## 2023-09-27 DIAGNOSIS — F411 Generalized anxiety disorder: Secondary | ICD-10-CM

## 2023-09-27 DIAGNOSIS — M48062 Spinal stenosis, lumbar region with neurogenic claudication: Secondary | ICD-10-CM

## 2023-09-27 DIAGNOSIS — R7303 Prediabetes: Secondary | ICD-10-CM | POA: Diagnosis not present

## 2023-09-27 DIAGNOSIS — L989 Disorder of the skin and subcutaneous tissue, unspecified: Secondary | ICD-10-CM

## 2023-09-27 DIAGNOSIS — E7800 Pure hypercholesterolemia, unspecified: Secondary | ICD-10-CM

## 2023-09-27 DIAGNOSIS — G4700 Insomnia, unspecified: Secondary | ICD-10-CM

## 2023-09-27 DIAGNOSIS — K5904 Chronic idiopathic constipation: Secondary | ICD-10-CM

## 2023-09-27 DIAGNOSIS — I10 Essential (primary) hypertension: Secondary | ICD-10-CM | POA: Diagnosis not present

## 2023-09-27 NOTE — Assessment & Plan Note (Signed)
 Blood pressure well-controlled on current medications, including amlodipine and olmesartan. - Continue current antihypertensive medications.

## 2023-09-27 NOTE — Assessment & Plan Note (Signed)
 Experiences chronic constipation, managed symptomatically. Irritable bowel syndrome and cannot take dicyclomine due to constipation side effects. - Continue symptomatic management of constipation.

## 2023-09-27 NOTE — Assessment & Plan Note (Signed)
 Recommend low carb diet Recheck A1c

## 2023-09-27 NOTE — Assessment & Plan Note (Signed)
 Cholesterol levels were good as of July. On atorvastatin for management. - Continue atorvastatin.

## 2023-09-27 NOTE — Assessment & Plan Note (Signed)
 Chronic back pain attributed to spinal stenosis. Reports difficulty with mobility and activities such as walking to the mailbox. Previous attempts to manage with Cymbalta were unsuccessful due to adverse effects. Surgical intervention advised against due to age and risk-benefit considerations. - Continue current management without surgical intervention.

## 2023-09-27 NOTE — Progress Notes (Signed)
 Established patient visit   Patient: Bobby Little   DOB: [birth year]   78 y.o. Male  MRN: [id number] Visit Date: 09/27/2023  Today's healthcare provider: Shirlee Latch, MD   Chief Complaint  Patient presents with   Medical Management of Chronic Issues    7 month follow-up. Patient reports taking medicatio as prescribed except for klonopin he is now taking 0.5 mg twice a day as he is wanting to ween himself off of it. He also reports currently not taking flomax.    Hypertension    Pt reports checking at home and running around 119/70s.    Hyperlipidemia   Rash    Spot on top of head for almost a year that will not heal. Reports it is sensitive associate with burning sensation. IF he gets a scab once he washes his hair it reopens.    Subjective     HPI     Medical Management of Chronic Issues    Additional comments: 7 month follow-up. Patient reports taking medicatio as prescribed except for klonopin he is now taking 0.5 mg twice a day as he is wanting to ween himself off of it. He also reports currently not taking flomax.         Hypertension    Additional comments: Pt reports checking at home and running around 119/70s.         Rash    Additional comments: Spot on top of head for almost a year that will not heal. Reports it is sensitive associate with burning sensation. IF he gets a scab once he washes his hair it reopens.       Last edited by Acey Lav, CMA on 09/27/2023 10:56 AM.       Discussed the use of AI scribe software for clinical note transcription with the patient, who gave verbal consent to proceed.  History of Present Illness   The patient, with a history of renal cysts and spinal stenosis, presents with a general feeling of unwellness. He reports sleep disturbances, waking up at 2 am and unable to return to sleep. He also reports constipation, which he has been managing with prunes and prune juice. The patient also mentions pain in various  locations, including the ribs and a sensitive spot on the scalp.  The patient has a history of renal cysts, one of which was measured at 11 cm two years ago. He expresses concern about the cyst's growth, but recent imaging shows no change in size. The patient also mentions a previous partial blockage from the kidney to the bladder, but recent imaging did not mention this.  The patient also has a history of spinal stenosis, which he believes is causing most of his pain. He reports difficulty with mobility, stating that walking to the mailbox or taking out the trash is an effort. He has been advised against surgery due to his age and the associated risks.  The patient is currently tapering off Klonopin, a process he is managing himself. He reports some withdrawal symptoms but is taking the process slowly. He also mentions a lesion on his scalp, which is sensitive and has been present for a long time.         Medications: Outpatient Medications Prior to Visit  Medication Sig   acetaminophen (TYLENOL) 500 MG tablet Take 500 mg by mouth.   amLODipine (NORVASC) 5 MG tablet Take 1 tablet (5 mg total) by mouth daily.   atorvastatin (LIPITOR) 40 MG tablet Take  1 tablet (40 mg total) by mouth daily.   clonazePAM (KLONOPIN) 1 MG tablet Take 1 mg by mouth 2 (two) times daily. Taking 1/2 tablet once a day   lidocaine (LMX) 4 % cream Apply 1 Application topically as needed.   Multiple Vitamins-Minerals (MULTIVITAMIN WITH MINERALS) tablet Take by mouth.   naproxen sodium (ALEVE) 220 MG tablet Take 220 mg by mouth daily as needed. 3 times weekly   olmesartan (BENICAR) 40 MG tablet TAKE ONE TABLET (40 MG TOTAL) BY MOUTH DAILY.   tamsulosin (FLOMAX) 0.4 MG CAPS capsule Take 1 capsule (0.4 mg total) by mouth daily. (Patient not taking: Reported on 09/10/2023)   No facility-administered medications prior to visit.    Review of Systems  Skin:  Positive for rash.       Objective    BP 120/78 (BP  Location: Left Arm, Patient Position: Sitting, Cuff Size: Large)   Pulse 69   Ht 6' (1.829 m)   Wt 193 lb 12.8 oz (87.9 kg)   SpO2 99%   BMI 26.28 kg/m    Physical Exam Vitals reviewed.  Constitutional:      General: He is not in acute distress.    Appearance: Normal appearance. He is not diaphoretic.  HENT:     Head: Normocephalic and atraumatic.  Eyes:     General: No scleral icterus.    Conjunctiva/sclera: Conjunctivae normal.  Cardiovascular:     Rate and Rhythm: Normal rate and regular rhythm.     Pulses: Normal pulses.     Heart sounds: Normal heart sounds. No murmur heard. Pulmonary:     Effort: Pulmonary effort is normal. No respiratory distress.     Breath sounds: Normal breath sounds. No wheezing or rhonchi.  Musculoskeletal:     Cervical back: Neck supple.     Right lower leg: No edema.     Left lower leg: No edema.  Lymphadenopathy:     Cervical: No cervical adenopathy.  Skin:    General: Skin is warm and dry.     Findings: No rash.  Neurological:     Mental Status: He is alert and oriented to person, place, and time. Mental status is at baseline.  Psychiatric:        Mood and Affect: Mood normal.        Behavior: Behavior normal.      No results found for any visits on 09/27/23.  Assessment & Plan     Problem List Items Addressed This Visit       Cardiovascular and Mediastinum   Essential hypertension - Primary   Blood pressure well-controlled on current medications, including amlodipine and olmesartan. - Continue current antihypertensive medications.      Relevant Orders   Comprehensive metabolic panel     Digestive   Chronic idiopathic constipation   Experiences chronic constipation, managed symptomatically. Irritable bowel syndrome and cannot take dicyclomine due to constipation side effects. - Continue symptomatic management of constipation.        Other   GAD (generalized anxiety disorder)   Declines treatment with  SSRI/SNRI  Self-tapering off Klonopin, reducing dose gradually over a four-month period. Experienced withdrawal symptoms in the past but currently managing tapering process without guidance from prescribing physician, Dr. Raynald Kemp. - Continue gradual tapering of Klonopin as planned.      Insomnia   Chronic difficulty sleeping, with reports of only 3-4 hours of sleep per night. Wakes up around 2 AM and struggles to return to sleep. Anxiety may  be a contributing factor, as his mind becomes active at night. Occupational stress may contribute to insomnia. No current use of medications like Zoloft or Lexapro due to adverse reactions. - Discussed potential anxiety as a contributing factor to insomnia.      Spinal stenosis, lumbar region, with neurogenic claudication   Chronic back pain attributed to spinal stenosis. Reports difficulty with mobility and activities such as walking to the mailbox. Previous attempts to manage with Cymbalta were unsuccessful due to adverse effects. Surgical intervention advised against due to age and risk-benefit considerations. - Continue current management without surgical intervention.      Prediabetes   Recommend low carb diet Recheck A1c       Relevant Orders   Hemoglobin A1c   Hyperlipidemia   Cholesterol levels were good as of July. On atorvastatin for management. - Continue atorvastatin.      Relevant Orders   Comprehensive metabolic panel   Lipid panel   Other Visit Diagnoses       Lesion of skin of scalp       Relevant Orders   Ambulatory referral to Dermatology          Scalp Lesion Suspicious lesion on the scalp, sensitive to touch and forms a scab. May be a non-melanoma skin cancer, given its location and characteristics. Requires further evaluation. - Refer to [facility] in [place] for evaluation and possible biopsy of the scalp lesion.  Right Renal Cyst Large right renal cyst measuring 11 cm, considered benign. Stable since  2022.  General Health Maintenance Routine health maintenance includes monitoring kidney and liver function, cholesterol, and blood sugar levels. Concern about potential kidney issues due to foam in urine, but previous tests showed normal protein levels. - Order blood tests to check cholesterol, kidney and liver function, and A1c.  Follow-up Advised to follow up for routine wellness visits and lab tests. - Schedule wellness visit in six months. - Complete lab tests today.          Return in about 6 months (around 03/29/2024) for AWV.      Total time spent on today's visit was greater than 40 minutes, including both face-to-face time and nonface-to-face time personally spent on review of chart (labs and imaging), discussing labs and goals, discussing further work-up, treatment options, referrals to specialist, answering patient's questions, and coordinating care.   Shirlee Latch, MD  [facility] [phone] (phone) [phone] (fax)  [facility]

## 2023-09-27 NOTE — Assessment & Plan Note (Signed)
 Declines treatment with SSRI/SNRI  Self-tapering off Klonopin, reducing dose gradually over a four-month period. Experienced withdrawal symptoms in the past but currently managing tapering process without guidance from prescribing physician, Dr. Raynald Kemp. - Continue gradual tapering of Klonopin as planned.

## 2023-09-27 NOTE — Assessment & Plan Note (Signed)
 Chronic difficulty sleeping, with reports of only 3-4 hours of sleep per night. Wakes up around 2 AM and struggles to return to sleep. Anxiety may be a contributing factor, as his mind becomes active at night. Occupational stress may contribute to insomnia. No current use of medications like Zoloft or Lexapro due to adverse reactions. - Discussed potential anxiety as a contributing factor to insomnia.

## 2023-09-28 ENCOUNTER — Encounter: Payer: Self-pay | Admitting: Family Medicine

## 2023-09-28 LAB — LIPID PANEL
Chol/HDL Ratio: 2.9 ratio (ref 0.0–5.0)
Cholesterol, Total: 153 mg/dL (ref 100–199)
HDL: 53 mg/dL (ref >39)
LDL Chol Calc (NIH): 82 mg/dL (ref 0–99)
Triglycerides: 100 mg/dL (ref 0–149)
VLDL Cholesterol Cal: 18 mg/dL (ref 5–40)

## 2023-09-28 LAB — COMPREHENSIVE METABOLIC PANEL
ALT: 24 IU/L (ref 0–44)
AST: 20 IU/L (ref 0–40)
Albumin: 4.5 g/dL (ref 3.8–4.8)
Alkaline Phosphatase: 136 IU/L — ABNORMAL HIGH (ref 44–121)
BUN/Creatinine Ratio: 16 (ref 10–24)
BUN: 17 mg/dL (ref 8–27)
Bilirubin Total: 0.6 mg/dL (ref 0.0–1.2)
CO2: 23 mmol/L (ref 20–29)
Calcium: 9.4 mg/dL (ref 8.6–10.2)
Chloride: 100 mmol/L (ref 96–106)
Creatinine, Ser: 1.08 mg/dL (ref 0.76–1.27)
Globulin, Total: 2.5 g/dL (ref 1.5–4.5)
Glucose: 101 mg/dL — ABNORMAL HIGH (ref 70–99)
Potassium: 4.8 mmol/L (ref 3.5–5.2)
Sodium: 139 mmol/L (ref 134–144)
Total Protein: 7 g/dL (ref 6.0–8.5)
eGFR: 71 mL/min/{1.73_m2} (ref >59)

## 2023-09-28 LAB — HEMOGLOBIN A1C
Est. average glucose Bld gHb Est-mCnc: 123 mg/dL
Hgb A1c MFr Bld: 5.9 % — ABNORMAL HIGH (ref 4.8–5.6)

## 2023-09-30 ENCOUNTER — Encounter: Payer: Self-pay | Admitting: Family Medicine

## 2023-10-01 ENCOUNTER — Other Ambulatory Visit: Payer: Self-pay | Admitting: Family Medicine

## 2023-10-01 DIAGNOSIS — I10 Essential (primary) hypertension: Secondary | ICD-10-CM

## 2023-10-02 ENCOUNTER — Encounter: Payer: Self-pay | Admitting: Family Medicine

## 2023-10-02 MED ORDER — OLMESARTAN MEDOXOMIL 40 MG PO TABS: 40.0000 mg | ORAL_TABLET | Freq: Every day | ORAL | 1 refills | Status: DC

## 2023-10-02 NOTE — Telephone Encounter (Signed)
 Requested Prescriptions  Pending Prescriptions Disp Refills   olmesartan (BENICAR) 40 MG tablet [Pharmacy Med Name: OLMESARTAN MEDOXOMIL 40MG  TABLET] 90 tablet 0    Sig: TAKE ONE TABLET (40 MG TOTAL) BY MOUTH DAILY.     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

## 2023-10-02 NOTE — Addendum Note (Signed)
 Addended by: Ross Ludwig on: 10/02/2023 11:12 AM   Modules accepted: Orders

## 2023-10-03 NOTE — Telephone Encounter (Signed)
 Ok to place. Must have gotten missed

## 2023-10-26 LAB — PSA: Prostate Specific Ag, Serum: 0.3 ng/mL (ref 0.0–4.0)

## 2023-10-26 LAB — SPECIMEN STATUS REPORT

## 2023-10-28 ENCOUNTER — Encounter: Payer: Self-pay | Admitting: Family Medicine

## 2023-11-05 ENCOUNTER — Encounter: Payer: Self-pay | Admitting: Gastroenterology

## 2023-11-16 ENCOUNTER — Encounter: Payer: Self-pay | Admitting: Family Medicine

## 2023-11-30 ENCOUNTER — Encounter: Payer: Self-pay | Admitting: Family Medicine

## 2023-12-02 ENCOUNTER — Telehealth: Payer: Self-pay | Admitting: Family Medicine

## 2023-12-02 NOTE — Telephone Encounter (Signed)
 Got the patient scheduled for the soonest available with [facility] 12/05/23 @ 11am

## 2023-12-02 NOTE — Telephone Encounter (Signed)
 Let's get him an appt with anyone with availability

## 2023-12-05 ENCOUNTER — Ambulatory Visit (INDEPENDENT_AMBULATORY_CARE_PROVIDER_SITE_OTHER): Admitting: Family Medicine

## 2023-12-05 ENCOUNTER — Encounter: Payer: Self-pay | Admitting: Family Medicine

## 2023-12-05 VITALS — BP 112/78 | HR 79 | Ht 72.0 in | Wt 199.0 lb

## 2023-12-05 DIAGNOSIS — R0982 Postnasal drip: Secondary | ICD-10-CM

## 2023-12-05 DIAGNOSIS — I10 Essential (primary) hypertension: Secondary | ICD-10-CM | POA: Diagnosis not present

## 2023-12-05 MED ORDER — OLMESARTAN MEDOXOMIL 40 MG PO TABS: 40.0000 mg | ORAL_TABLET | Freq: Every day | ORAL | Status: DC

## 2023-12-05 MED ORDER — CAPTOPRIL 25 MG PO TABS: 25.0000 mg | ORAL_TABLET | Freq: Three times a day (TID) | ORAL | 1 refills | Status: DC

## 2023-12-05 NOTE — Progress Notes (Signed)
 ACUTE visit   Patient: Bobby Little   DOB: 12-Apr-1946   78 y.o. Male  MRN: [id number] Visit Date: 12/05/2023  Today's healthcare provider: Mimi Alt, MD   Chief Complaint  Patient presents with   Hypertension    Bp has been on the higher side 178/109, he stated a doctor in [place] gave him captopril in case of an emergency. He took a benicar  in the morning it didn't help so he took captopril that afternoon and it help. He stated every since he has been off klonopin  his Bp has been high, so he decided to to started back taking it and every since it bp has been better.    Subjective     HPI     Hypertension    Additional comments: Bp has been on the higher side 178/109, he stated a doctor in [place] gave him captopril in case of an emergency. He took a benicar  in the morning it didn't help so he took captopril that afternoon and it help. He stated every since he has been off klonopin  his Bp has been high, so he decided to to started back taking it and every since it bp has been better.       Last edited by Bart Lieu, CMA on 12/05/2023 11:23 AM.       Discussed the use of AI scribe software for clinical note transcription with the patient, who gave verbal consent to proceed.  History of Present Illness Bobby Little "Genella Kendall" is a 78 year old male with hypertension who presents with elevated blood pressure.  He has experienced elevated blood pressure, with a recent reading of 178/109 mmHg. His current medication regimen includes amlodipine  5 mg daily and captopril as needed. He was previously prescribed olmesartan  but is not taking it regularly. He describes his blood pressure as fluctuating significantly. He notes that his blood pressure readings are sometimes accompanied by lightheadedness and nausea.  He has a history of anxiety for which he took Klonopin  for about 20 years. He weaned off the medication over six months but recently restarted it due to  returning anxiety symptoms. He was taking 1.75 mg daily before tapering off.  Over the past year, he has noticed a significant change in urination, urinating approximately 15 times a day. He has discussed this issue with his primary care provider. He also experiences foam in his urine occasionally, though his kidney function was normal on the last test.  He mentions a past liver abnormality that was noted but not further investigated. 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  He has spinal stenosis, which causes significant pain and limits his physical activity. He has received multiple spinal injections over the years without relief. He uses a cane and a scooter for mobility and has a handicap sticker for his car.  He experiences nasal congestion for the past year, which was unresponsive to antibiotics. He uses Flonase and Nasacort  without relief.     Past Medical History:  Diagnosis Date   Anxiety    x 20 y per pt   Arthritis    Degenerative disc disease, lumbar    Depression    Diverticulitis    Family history of polyps in the colon    Gastrointestinal stromal tumor (GIST) (HCC)    GIST (gastrointestinal stroma tumor), malignant, colon (HCC)    GIST (gastrointestinal stroma tumor), malignant, colon (HCC)  Hay fever/allergies    Hepatic cyst    History of spinal fusion for scoliosis    Hypertension    Polycystic kidney    Prediabetes    Spinal stenosis of lumbar region     Medications: Outpatient Medications Prior to Visit  Medication Sig   acetaminophen (TYLENOL) 500 MG tablet Take 500 mg by mouth.   amLODipine  (NORVASC ) 5 MG tablet Take 1 tablet (5 mg total) by mouth daily.   atorvastatin  (LIPITOR) 40 MG tablet Take 1 tablet (40 mg total) by mouth daily.   clonazePAM  (KLONOPIN ) 1 MG tablet Take 1 mg by mouth 2 (two) times daily. Taking 1/2 tablet once a day   ibuprofen (ADVIL) 200 MG tablet  Take 200 mg by mouth.   lidocaine  (LMX) 4 % cream Apply 1 Application topically as needed.   Multiple Vitamins-Minerals (MULTIVITAMIN WITH MINERALS) tablet Take by mouth.   naproxen sodium (ALEVE) 220 MG tablet Take 220 mg by mouth daily as needed. 3 times weekly   [DISCONTINUED] captopril (CAPOTEN) 25 MG tablet Take 25 mg by mouth 3 (three) times daily.   [DISCONTINUED] olmesartan  (BENICAR ) 40 MG tablet Take 1 tablet (40 mg total) by mouth daily. TAKE ONE TABLET (40 MG TOTAL) BY MOUTH DAILY.   [DISCONTINUED] tamsulosin  (FLOMAX ) 0.4 MG CAPS capsule Take 1 capsule (0.4 mg total) by mouth daily. (Patient not taking: Reported on 09/10/2023)   No facility-administered medications prior to visit.    Review of Systems  Last metabolic panel Lab Results  Component Value Date   GLUCOSE 101 (H) 09/27/2023   NA 139 09/27/2023   K 4.8 09/27/2023   CL 100 09/27/2023   CO2 23 09/27/2023   BUN 17 09/27/2023   CREATININE 1.08 09/27/2023   EGFR 71 09/27/2023   CALCIUM  9.4 09/27/2023   PROT 7.0 09/27/2023   ALBUMIN 4.5 09/27/2023   LABGLOB 2.5 09/27/2023   AGRATIO 2.0 01/12/2022   BILITOT 0.6 09/27/2023   ALKPHOS 136 (H) 09/27/2023   AST 20 09/27/2023   ALT 24 09/27/2023   ANIONGAP 9 04/11/2018        Objective    BP 112/78 (Cuff Size: Normal)   Pulse 79   Ht 6' (1.829 m)   Wt 199 lb (90.3 kg)   SpO2 100%   BMI 26.99 kg/m   BP Readings from Last 3 Encounters:  12/05/23 112/78  09/27/23 120/78  09/10/23 129/77   Wt Readings from Last 3 Encounters:  12/05/23 199 lb (90.3 kg)  09/27/23 193 lb 12.8 oz (87.9 kg)  09/10/23 192 lb 3.2 oz (87.2 kg)        Physical Exam  General: Alert, no acute distress Cardio: Normal S1 and S2, RRR, no r/m/g Pulm: CTAB, normal work of breathing ABD: normal BS   No results found for any visits on 12/05/23.  Assessment & Plan     Problem List Items Addressed This Visit       Cardiovascular and Mediastinum   Essential hypertension -  Primary   Hypertension with recent elevated readings of 178/109. Current regimen includes amlodipine  5 mg daily and olmesartan  40 mg daily, which have not adequately controlled blood pressure. Captopril 25 mg three times daily was effective in reducing blood pressure during an acute episode. Discussed potential risks of combining olmesartan  and captopril due to similar mechanisms and potential renal impact. Decision made to switch from olmesartan  to captopril for better blood pressure control. He will monitor blood pressure and follow up to assess response. -  Prescribe captopril 25 mg three times daily. - Continue amlodipine  5 mg daily. - Monitor blood pressure regularly. - Follow up with Doctor V in a couple of weeks to assess response to captopril.      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 Symptoms seem to correlate with blood pressure fluctuations, improving when blood pressure is within normal range. Possible association with hypertension.  Spinal stenosis Chronic spinal stenosis with significant impact on mobility and quality of life. Previous interventions, including multiple epidural steroid injections, have been ineffective. Symptoms include pain and difficulty with activities of daily living. He reports using a cane and scooter for mobility.  Anxiety Anxiety managed with clonazepam  for 20 years. Recently attempted to wean off clonazepam  over six months but experienced return of anxiety symptoms. Resumed clonazepam  with noted improvement in symptoms. He reports previous high anxiety levels due to personal stressors. - Continue current clonazepam  regimen as previously effective.  Nasal congestion Chronic nasal congestion for over a year, unresponsive to antibiotics. Possible allergic  component considered. Current use of Flonase and Nasacort  without relief. Discussed potential use of over-the-counter antihistamines like Claritin or Allegra, avoiding decongestants due to hypertension. - Try over-the-counter Claritin for postnasal drip.   Return in about 1 month (around 01/05/2024) for HTN.       Mimi Alt, MD  [facility] [phone] (phone) [phone] (fax)  [facility]

## 2023-12-05 NOTE — Assessment & Plan Note (Signed)
 Hypertension with recent elevated readings of 178/109. Current regimen includes amlodipine  5 mg daily and olmesartan  40 mg daily, which have not adequately controlled blood pressure. Captopril 25 mg three times daily was effective in reducing blood pressure during an acute episode. Discussed potential risks of combining olmesartan  and captopril due to similar mechanisms and potential renal impact. Decision made to switch from olmesartan  to captopril for better blood pressure control. He will monitor blood pressure and follow up to assess response. - Prescribe captopril 25 mg three times daily. - Continue amlodipine  5 mg daily. - Monitor blood pressure regularly. - Follow up with Doctor V in a couple of weeks to assess response to captopril.

## 2023-12-07 ENCOUNTER — Encounter: Payer: Self-pay | Admitting: Family Medicine

## 2023-12-09 NOTE — Telephone Encounter (Signed)
 Please see the message below.

## 2023-12-17 NOTE — Telephone Encounter (Signed)
Please see the pt message below

## 2023-12-17 NOTE — Telephone Encounter (Signed)
 Please see the pt response below

## 2023-12-19 NOTE — Telephone Encounter (Signed)
 Please see the pt Bp reading below

## 2024-01-07 ENCOUNTER — Ambulatory Visit: Admitting: Family Medicine

## 2024-01-15 ENCOUNTER — Telehealth: Payer: Self-pay | Admitting: Family Medicine

## 2024-01-15 ENCOUNTER — Other Ambulatory Visit: Payer: Self-pay | Admitting: Family Medicine

## 2024-01-15 DIAGNOSIS — E7800 Pure hypercholesterolemia, unspecified: Secondary | ICD-10-CM

## 2024-01-15 DIAGNOSIS — I10 Essential (primary) hypertension: Secondary | ICD-10-CM

## 2024-01-15 MED ORDER — AMLODIPINE BESYLATE 5 MG PO TABS: 5.0000 mg | ORAL_TABLET | Freq: Every day | ORAL | 3 refills | Status: DC

## 2024-01-15 MED ORDER — ATORVASTATIN CALCIUM 40 MG PO TABS: 40.0000 mg | ORAL_TABLET | Freq: Every day | ORAL | 2 refills | Status: DC

## 2024-01-15 NOTE — Telephone Encounter (Signed)
 [facility] pharmacy faxed refill request for the following medications://90 day supply   amLODipine  (NORVASC ) 5 MG tablet  atorvastatin  (LIPITOR) 40 MG tablet    Please advise

## 2024-01-15 NOTE — Telephone Encounter (Signed)
 Copied from CRM [id number]. Topic: Clinical - Medication Question >> Jan 15, 2024 10:13 AM Treva T wrote: Reason for CRM: Patient calling states, he has returned to taking his previous medication as the new medication, captopril  (CAPOTEN ) 25 MG tablet prescribed did not help with blood pressure, and not taking any longer..  Per patient wanted to inquire on getting refills for previous prescribed medications, of Amlodipine , Atorvastatin , and Olmesartan , as these medications has seemed to work better.   Patient can be reached at [phone], if this is approved and to discuss further.     Preferred pharmacy: Not located in preferred pharmacy list. Per patient listed below is the only contact information listed on medical/pharmacy card.   [facility] Ph. [phone]

## 2024-01-15 NOTE — Telephone Encounter (Signed)
 Pt needs to be seen in person for evaluation, before making medication adjustments. Per chart review appears to have hx of labile and uncontrolled BP. Will refill his amlodipine  and Lipitor as these a chronic medications.

## 2024-01-16 ENCOUNTER — Other Ambulatory Visit: Payer: Self-pay

## 2024-01-16 ENCOUNTER — Telehealth: Payer: Self-pay | Admitting: Family Medicine

## 2024-01-16 DIAGNOSIS — I10 Essential (primary) hypertension: Secondary | ICD-10-CM

## 2024-01-16 DIAGNOSIS — E7800 Pure hypercholesterolemia, unspecified: Secondary | ICD-10-CM

## 2024-01-16 MED ORDER — OLMESARTAN MEDOXOMIL 40 MG PO TABS: 40.0000 mg | ORAL_TABLET | Freq: Every day | ORAL | 1 refills | Status: DC

## 2024-01-16 MED ORDER — ATORVASTATIN CALCIUM 40 MG PO TABS: 40.0000 mg | ORAL_TABLET | Freq: Every day | ORAL | 2 refills | Status: DC

## 2024-01-16 MED ORDER — AMLODIPINE BESYLATE 5 MG PO TABS: 5.0000 mg | ORAL_TABLET | Freq: Every day | ORAL | 3 refills | Status: DC

## 2024-01-16 NOTE — Telephone Encounter (Addendum)
 [facility] pharmacy faxed refill request for the following medications: DON'T Send To [facility] [employer] [facility].SABRASEND to [facility], per patient.   olmesartan  (BENICAR ) 40 MG tablet  amLODipine  (NORVASC ) 5 MG tablet  atorvastatin  (LIPITOR) 40 MG tablet      Please advise.SABRASABRASABRAPatient has an appt scheduled on 01/23/2024 per Curtis Cliton's request-BP f/u with PCP.

## 2024-01-16 NOTE — Telephone Encounter (Signed)
 LMTCB-ok for E2C2 to give patient provider's message and schedule patient BP follow-up. From last office visit was to follow-up around this time.

## 2024-01-16 NOTE — Telephone Encounter (Signed)
Converted to rf req

## 2024-01-20 NOTE — Telephone Encounter (Signed)
 Appt scheduled 01/23/24

## 2024-01-23 ENCOUNTER — Ambulatory Visit: Admitting: Family Medicine

## 2024-01-23 ENCOUNTER — Encounter: Payer: Self-pay | Admitting: Family Medicine

## 2024-01-23 VITALS — BP 114/66 | HR 70 | Ht 72.0 in | Wt 197.5 lb

## 2024-01-23 DIAGNOSIS — H9313 Tinnitus, bilateral: Secondary | ICD-10-CM

## 2024-01-23 DIAGNOSIS — I10 Essential (primary) hypertension: Secondary | ICD-10-CM | POA: Diagnosis not present

## 2024-01-23 DIAGNOSIS — G894 Chronic pain syndrome: Secondary | ICD-10-CM

## 2024-01-23 DIAGNOSIS — M48062 Spinal stenosis, lumbar region with neurogenic claudication: Secondary | ICD-10-CM

## 2024-01-23 DIAGNOSIS — F411 Generalized anxiety disorder: Secondary | ICD-10-CM | POA: Diagnosis not present

## 2024-01-23 DIAGNOSIS — K5904 Chronic idiopathic constipation: Secondary | ICD-10-CM

## 2024-01-23 DIAGNOSIS — C49A2 Gastrointestinal stromal tumor of stomach: Secondary | ICD-10-CM

## 2024-01-23 DIAGNOSIS — M5416 Radiculopathy, lumbar region: Secondary | ICD-10-CM

## 2024-01-23 DIAGNOSIS — G8929 Other chronic pain: Secondary | ICD-10-CM

## 2024-01-23 NOTE — Progress Notes (Signed)
 Established patient visit   Patient: Bobby Little   DOB: 02-Jul-1946   78 y.o. Male  MRN: [id number] Visit Date: 01/23/2024  Today's healthcare provider: Jon Eva, MD   Chief Complaint  Patient presents with   Medical Management of Chronic Issues    Patient wanted to discuss behavioral health referral    Hypertension    Has been checking at home, last reading 117/62. Has had some high readings in 150s/80s. Reports no side effects from medications, has had some sob and tightness in chest.    Subjective    HPI HPI     Medical Management of Chronic Issues    Additional comments: Patient wanted to discuss behavioral health referral         Hypertension    Additional comments: Has been checking at home, last reading 117/62. Has had some high readings in 150s/80s. Reports no side effects from medications, has had some sob and tightness in chest.       Last edited by Cherry Chiquita HERO on 01/23/2024  3:08 PM.       Discussed the use of AI scribe software for clinical note transcription with the patient, who gave verbal consent to proceed.  History of Present Illness   Bobby Little is a 78 year old male with hypertension and spinal stenosis who presents for blood pressure management and tinnitus.  His blood pressure is well-controlled with amlodipine  5 mg daily and olmesartan  40 mg daily, with recent readings around 114/66 mmHg. He believes olive oil has contributed to improvements in blood pressure, constipation, and circulation.  He experiences significant tinnitus, which worsened after discontinuing Klonopin . Resuming Klonopin  has been helpful for both tinnitus and back pain. He uses white noise devices to mask the tinnitus.  He has spinal stenosis causing pain throughout his body, including his back and hip. Hip pain sometimes distracts from back pain. He manages constipation with olive oil, taken in the morning and sometimes at night.  He reports frequent  urination and a past ultrasound showed a partial blockage between his right kidney and bladder, which was not visible on follow-up. He notes foam in his urine, with normal protein levels on urine tests.  He is concerned about finding a new psychiatrist for medication management after his previous psychiatrist left. He has a 30-day supply of Klonopin  and seeks ongoing management.         Medications: Outpatient Medications Prior to Visit  Medication Sig   acetaminophen (TYLENOL) 500 MG tablet Take 500 mg by mouth.   amLODipine  (NORVASC ) 5 MG tablet Take 1 tablet (5 mg total) by mouth daily.   atorvastatin  (LIPITOR) 40 MG tablet Take 1 tablet (40 mg total) by mouth daily.   clonazePAM  (KLONOPIN ) 1 MG tablet Take 1 mg by mouth 2 (two) times daily. Taking 1/2 tablet once a day   ibuprofen (ADVIL) 200 MG tablet Take 200 mg by mouth.   lidocaine  (LMX) 4 % cream Apply 1 Application topically as needed.   naproxen sodium (ALEVE) 220 MG tablet Take 220 mg by mouth daily as needed. 3 times weekly   olmesartan  (BENICAR ) 40 MG tablet Take 1 tablet (40 mg total) by mouth daily.   [DISCONTINUED] captopril  (CAPOTEN ) 25 MG tablet Take 1 tablet (25 mg total) by mouth 3 (three) times daily. (Patient not taking: Reported on 12/05/2023)   [DISCONTINUED] Multiple Vitamins-Minerals (MULTIVITAMIN WITH MINERALS) tablet Take by mouth. (Patient not taking: Reported on 01/23/2024)   No facility-administered  medications prior to visit.    Review of Systems     Objective    BP 114/66 (BP Location: Left Arm, Patient Position: Sitting, Cuff Size: Normal)   Pulse 70   Ht 6' (1.829 m)   Wt 197 lb 8 oz (89.6 kg)   SpO2 99%   BMI 26.79 kg/m    Physical Exam Vitals reviewed.  Constitutional:      General: He is not in acute distress.    Appearance: Normal appearance. He is not diaphoretic.  HENT:     Head: Normocephalic and atraumatic.  Eyes:     General: No scleral icterus.    Conjunctiva/sclera:  Conjunctivae normal.  Cardiovascular:     Rate and Rhythm: Normal rate and regular rhythm.     Heart sounds: Normal heart sounds. No murmur heard. Pulmonary:     Effort: Pulmonary effort is normal. No respiratory distress.     Breath sounds: Normal breath sounds. No wheezing or rhonchi.  Musculoskeletal:     Cervical back: Neck supple.     Right lower leg: No edema.     Left lower leg: No edema.  Lymphadenopathy:     Cervical: No cervical adenopathy.  Skin:    General: Skin is warm and dry.     Findings: No rash.  Neurological:     Mental Status: He is alert. Mental status is at baseline.      No results found for any visits on 01/23/24.  Assessment & Plan     Problem List Items Addressed This Visit       Cardiovascular and Mediastinum   Essential hypertension - Primary     Digestive   GIST, malignant (HCC)   Chronic idiopathic constipation     Other   GAD (generalized anxiety disorder)   Relevant Orders   Ambulatory referral to Psychiatry   Spinal stenosis, lumbar region, with neurogenic claudication   Chronic radicular lumbar pain   Chronic pain syndrome   Other Visit Diagnoses       Tinnitus of both ears               Tinnitus Persistent tinnitus worsened after discontinuation of Klonopin . Previous evaluation suggested surgery with a 50% success rate, which was declined. Potential ENT referral for further evaluation and management, including hearing aids if hearing loss is detected. - Consider ENT referral for further evaluation and management.  Spinal stenosis Chronic spinal stenosis with associated pain in the back and potentially the groin, present for 15 years.  Hypertension Blood pressure is well-controlled with amlodipine  and olmesartan . Current readings are 114/66 mmHg. Discussion about olive oil's impact on blood pressure, but current control attributed to medication. - Continue amlodipine  and olmesartan  as prescribed.  Chronic  constipation Chronic constipation managed effectively with olive oil, taken in the morning, improving bowel movements and circulation. - Continue using olive oil for constipation management.  Liver cysts Liver cysts identified on previous CT scan are common in men over 50 and not concerning. No further intervention required.  Psychiatric medication management Requires new psychiatrist for ongoing medication management, particularly for Klonopin , after previous psychiatrist left the practice. Referral made to a psychiatrist in [place]. - Ensure referral to psychiatrist in [place] for medication management is completed.        Return for as scheduled, AWV.      I personally spent a total of 46 minutes in the care of the patient today including preparing to see the patient, getting/reviewing separately obtained history,  performing a medically appropriate exam/evaluation, counseling and educating, placing orders, referring and communicating with other health care professionals, documenting clinical information in the EHR, independently interpreting results, and coordinating care.   Jon Eva, MD  [facility] [phone] (phone) [phone] (fax)  [facility]

## 2024-01-30 ENCOUNTER — Encounter: Payer: Self-pay | Admitting: Family Medicine

## 2024-01-31 NOTE — Telephone Encounter (Signed)
Please see the message below and advise.

## 2024-02-05 NOTE — Telephone Encounter (Signed)
 Needs acute visit for urinary frequency

## 2024-02-06 ENCOUNTER — Encounter: Payer: Self-pay | Admitting: Family Medicine

## 2024-02-12 ENCOUNTER — Ambulatory Visit: Admitting: Psychiatry

## 2024-02-20 NOTE — Progress Notes (Signed)
 Psychiatric Initial Adult Assessment   Patient Identification: Bobby Little MRN:  [id number] Date of Evaluation:  02/25/2024 Referral Source:  Myrla Jon HERO, MD  Chief Complaint:   Chief Complaint  Patient presents with   Establish Care   Visit Diagnosis:    ICD-10-CM   1. Adjustment disorder with anxiety  F43.22     2. Benzodiazepine dependence (HCC)  F13.20     3. High risk medication use  Z79.899 Monitor Drug Profile 10(MW)      History of Present Illness:   Bobby Little is a 78 y.o. 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   He states that he used to be seen by Dr. Beverley at [facility] for 30 years for medication management.  He has been on clonazepam  1 mg BID.  He was able to wean himself off for about a month.  He started to have tinnitus since he had a procedure for skin cancer in his head.  He found all [facility] website that clonazepam  can be helpful for this condition.  He also has IBS constipation, and finds clonazepam  to be helpful.  He has insomnia due to back pain secondary to spinal stenosis.  Clonazepam  is helpful for this condition as well.  He also experiences strange feeling of somebody is sneaking up, and tightness in his chest when he does not take clonazepam .   He states that he went through terrible divorce for seven years. He was started on clonazepam  around that time without acknowledging his side effect of dependence. He states that he kept fighting that time.  He had to go to the court as he had to pay child support to his wife despite that his children were living with him.  His ex-wife suffered from extreme mental illness.  She was deceased in March 10, 2009 from dementia.  He states that he  believes seeing what Bible tells, and does not hold grudge against anybody.  He reports concern regarding the finances.  Although he used to have good pension, he does not have as much due to inflation.  However, he denies feeling overwhelmed due to this. He reports good relationship with his wife, and his children.   Depression- The patient has mood symptoms as in PHQ-9/GAD-7.  Although he reports feeling down when he is unable to get people to understand faith without work is dead. He believes in [place]. He has middle insomnia due to pain.  He used to enjoy hunting, fishing and target shooting when he used to live in [place] .  He still does have those interest, although he has not done these lately. He denies Si, HI, hallucinations.   Physical-he has tinnitus, back pain as written above. He denies concern about a neuroendocrine tumor following the results of the CT scan. He was reassured upon being informed that no follow-up was necessary.  He states that his primary care would not prescribe clonazepam  and he does not know what to do.  Psychoeducation was provided at length regarding concerns about the current dosage, particularly in the context of his age and associated risks such as falls and confusion.  He states that he is confident: in clonazepam .  He denies any fall or confusion, and has been on this medication for many  years.  He was advised to consider transfer to another provider if his preference is to continue the current dose of clonazepam , as this writer intends to initiate a slow tapering process.  After having discussed at length, he states that he would try to come off clonazepam .  Although he initially asks the medication to be prescribed for 90 days, as did Dr. Beverley, he expressed understanding that this medication will be prescribed only on a monthly basis.   Medication- clonazepam  1 mg twice a day  Support: wife Household:  wife Marital status:married for nine years with  [place], divorced before Number of children: 2 boys ([place], [place]), 1 step son (in [place]) Employment: retired 2010,  Education:   He grew up in [place] .  He reports great relationships with both of his parents.  His mother died at 77 year old.  Substance use  Tobacco Alcohol Other substances/  Current Cigar now and then denies denies  Past  denies denies  Past Treatment        Associated Signs/Symptoms: Depression Symptoms:  insomnia, anxiety, (Hypo) Manic Symptoms:  denies decreased need for sleep, euphoria Anxiety Symptoms:  mild anxiety  Psychotic Symptoms:  denies AH, VH, paranoia PTSD Symptoms: Negative  Past Psychiatric History:  Outpatient: Dr Daniel at [facility] Psychiatry admission: denies Previous suicide attempt: denies Past trials of medication: sertraline, venlafaxine (he felt dealing was easier than taking medication), Buspar  (fuzzy headed), gabapentin  (limited benefit) History of violence: denies History of head injury:   Previous Psychotropic Medications: Yes   Substance Abuse History in the last 12 months:  No.  Consequences of Substance Abuse: NA  Past Medical History:  Past Medical History:  Diagnosis Date   Anxiety    x 20 y per pt   Arthritis    Degenerative disc disease, lumbar    Depression    Diverticulitis    Family history of polyps in the colon    Gastrointestinal stromal tumor (GIST) (HCC)    GIST (gastrointestinal stroma tumor), malignant, colon (HCC)    GIST (gastrointestinal stroma tumor), malignant, colon (HCC)    Hay fever/allergies    Hepatic cyst    History of spinal fusion for scoliosis    Hypertension    Polycystic kidney    Prediabetes    Spinal stenosis of lumbar region     Past Surgical History:  Procedure Laterality Date   COLONOSCOPY WITH PROPOFOL  N/A 11/22/2016   Procedure: COLONOSCOPY WITH PROPOFOL ;  Surgeon: Ruel Kung, MD;  Location: [facility] ENDOSCOPY;  Service: Endoscopy;  Laterality: N/A;   COLONOSCOPY WITH  PROPOFOL  N/A 11/10/2021   Procedure: COLONOSCOPY WITH PROPOFOL ;  Surgeon: Kung Ruel, MD;  Location: [facility] ENDOSCOPY;  Service: Gastroenterology;  Laterality: N/A;   KNEE ARTHROCENTESIS Right    PANCREAS SURGERY  10/2013   PARTIAL GASTRECTOMY     REMOVAL OF GASTROINTESTINAL STOMATIC  TUMOR OF STOMACH  10/2013   SPLENECTOMY  10/2013   TUMOR REMOVAL     stomach, slpeen, and pancreas    Family Psychiatric History: as below  Family History:  Family History  Problem Relation Age of Onset   Hypertension Mother    Lymphoma Mother    Hypertension Father    Valvular heart disease Father    Dementia Father    Stroke Maternal Grandfather    Prostate cancer Paternal Grandfather    Coronary artery disease Sister    Coronary artery disease Brother    Colon cancer Neg Hx     Social History:  Social History   Socioeconomic History   Marital status: Married    Spouse name: Not on file   Number of children: 2   Years of education: Not on file   Highest education level: Bachelor's degree (e.g., BA, AB, BS)  Occupational History   Occupation: retired  Tobacco Use   Smoking status: Some Days    Types: Cigars    Last attempt to quit: 07/29/2021    Years since quitting: 2.5   Smokeless tobacco: Never   Tobacco comments:    1 cigar 3-4 times year  Vaping Use   Vaping status: Never Used  Substance and Sexual Activity   Alcohol use: No   Drug use: No   Sexual activity: Yes    Partners: Female  Other Topics Concern   Not on file  Social History Narrative   Not on file   Social Drivers of Health   Financial Resource Strain: Medium Risk (01/19/2024)   Overall Financial Resource Strain (CARDIA)    Difficulty of Paying Living Expenses: Somewhat hard  Food Insecurity: Patient Declined (01/19/2024)   Hunger Vital Sign    Worried About Running Out of Food in the Last Year: Patient declined    Ran Out of Food in the Last Year: Patient declined  Transportation Needs: No Transportation  Needs (01/19/2024)   PRAPARE - Administrator, Civil Service (Medical): No    Lack of Transportation (Non-Medical): No  Physical Activity: Unknown (01/19/2024)   Exercise Vital Sign    Days of Exercise per Week: Patient declined    Minutes of Exercise per Session: Not on file  Stress: No Stress Concern Present (01/19/2024)   [employer] of Occupational Health - Occupational Stress Questionnaire    Feeling of Stress: Only a little  Social Connections: Unknown (01/19/2024)   Social Connection and Isolation Panel    Frequency of Communication with Friends and Family: Twice a week    Frequency of Social Gatherings with Friends and Family: Patient declined    Attends Religious Services: More than 4 times per year    Active Member of [employer] or Organizations: Patient declined    Attends Engineer, structural: Not on file    Marital Status: Married    Additional Social History: as above  Allergies:   Allergies  Allergen Reactions   Beta Adrenergic Blockers     Dizziness, low heart rate   Codeine Nausea And Vomiting   Trazodone  And Nefazodone Other (See Comments)    insomnia    Metabolic Disorder Labs: Lab Results  Component Value Date   HGBA1C 5.9 (H) 09/27/2023   No results found for: PROLACTIN Lab Results  Component Value Date   CHOL 153 09/27/2023   TRIG 100 09/27/2023   HDL 53 09/27/2023   CHOLHDL 2.9 09/27/2023   LDLCALC 82 09/27/2023   LDLCALC 82 02/01/2023   Lab Results  Component Value Date   TSH 3.140 04/03/2021    Therapeutic Level Labs: No results found for: LITHIUM No results found for: CBMZ No results found for: VALPROATE  Current Medications: Current Outpatient Medications  Medication Sig Dispense Refill   acetaminophen (TYLENOL) 500 MG tablet Take 500 mg by mouth.     amLODipine  (NORVASC ) 5 MG tablet Take 1 tablet (5 mg total) by mouth daily. 90 tablet 3   atorvastatin  (LIPITOR) 40 MG tablet Take 1 tablet (40 mg total)  by mouth daily. 90 tablet 2   clonazePAM  (KLONOPIN ) 0.25 MG disintegrating tablet Take 3  tablets (0.75 mg total) by mouth daily. 90 tablet 0   clonazePAM  (KLONOPIN ) 1 MG tablet Take 1 mg by mouth 2 (two) times daily. Taking 1/2 tablet once a day     clonazePAM  (KLONOPIN ) 1 MG tablet Take 1 tablet (1 mg total) by mouth every evening. 30 tablet 1   ibuprofen (ADVIL) 200 MG tablet Take 200 mg by mouth.     lidocaine  (LMX) 4 % cream Apply 1 Application topically as needed.     naproxen sodium (ALEVE) 220 MG tablet Take 220 mg by mouth daily as needed. 3 times weekly     olmesartan  (BENICAR ) 40 MG tablet Take 1 tablet (40 mg total) by mouth daily. 90 tablet 1   No current facility-administered medications for this visit.    Musculoskeletal: Strength & Muscle Tone: within normal limits Gait & Station: uses a cane Patient leans: N/A  Psychiatric Specialty Exam: Review of Systems  Psychiatric/Behavioral:  Positive for sleep disturbance. Negative for agitation, behavioral problems, confusion, decreased concentration, dysphoric mood, hallucinations, self-injury and suicidal ideas. The patient is nervous/anxious. The patient is not hyperactive.   All other systems reviewed and are negative.   Blood pressure (!) 140/72, pulse 75, temperature 98.3 F (36.8 C), temperature source Temporal, height 6' (1.829 m), weight 204 lb (92.5 kg), SpO2 99%.Body mass index is 27.67 kg/m.  General Appearance: Well Groomed  Eye Contact:  Good  Speech:  Clear and Coherent  Volume:  Normal  Mood:  good  Affect:  Appropriate, Congruent, and Full Range  Thought Process:  Coherent  Orientation:  Full (Time, Place, and Person)  Thought Content:  Logical  Suicidal Thoughts:  No  Homicidal Thoughts:  No  Memory:  Immediate;   Good  Judgement:  Good  Insight:  Good  Psychomotor Activity:  Normal  Concentration:  Concentration: Good and Attention Span: Good  Recall:  Good  Fund of Knowledge:Good  Language: Good   Akathisia:  No  Handed:  Right  AIMS (if indicated):  N/A  Assets:  Communication Skills Desire for Improvement  ADL's:  Intact  Cognition: WNL  Sleep:  Poor   Screenings: GAD-7    Flowsheet Row Office Visit from 02/25/2024 in [facility] Office Visit from 01/23/2024 in [facility] Office Visit from 09/27/2023 in [facility] Office Visit from 05/02/2023 in [facility] Office Visit from 12/12/2016 in [facility]  Total GAD-7 Score 2 8 4 4 6    PHQ2-9    Flowsheet Row Office Visit from 02/25/2024 in [facility] Office Visit from 01/23/2024 in [facility] Office Visit from 09/27/2023 in [facility] Office Visit from 05/02/2023 in [facility] Office Visit from 02/01/2023 in [facility]  PHQ-2 Total Score 1 2 3 1 2   PHQ-9 Total Score -- 4 8 9 7    Flowsheet Row Admission (Discharged) from 11/10/2021 in [facility]  C-SSRS RISK CATEGORY No Risk    Assessment and Plan:  Juancarlos Crescenzo is a 78 y.o. 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   1. Adjustment disorder  with anxiety The patient has been on clonazepam  for approximately 30 years, prescribed by Dr. Daniel for management of chronic anxiety, tinnitus, and insomnia secondary to pain. He has a psychosocial history notable for significant stress related to a past divorce and ongoing financial strain, and chronic back pain.  History: no admission.  Originally on clonazepam  1 mg BID  He reports physical sensation anxiety without ruminative thoughts, and has been taking clonazepam  for over 30 years, which has been reportedly effective for tinnitus, and insomnia secondary to chronic back pain.  Although it was discussed to consider starting pregabalin, he would like to hold it for now.  He is not interested in therapy either.  Will continue to assess  and intervene as needed.   2. Benzodiazepine dependence (HCC) After a through discussion, he agrees to start tapiering off clonazepam .  He has no known history of seizure, although he experienced mild tremors when he tapered it down in the past.  Will do this process very slowly to avoid withdrawal symptoms.  Discussed potential risk of using clonazepam , which includes but not limited to fall, oversedation, tolerance and dependence.   3. High risk medication use We obtain UDS.  He expressed understanding that clonazepam  will not be ordered if any concern of misuse of this medication, or any other substance equals.    Plan Decrease clonazepam  0.75 mg in am, 1 mg in PM for one month, then 0.5 mg in AM, and 1 mg in PM (another refill will be sent after reviewing UDS. He agrees that 90 days will NOT be prescribed) Obtain lab (urine screening) Next appointment: 9/18 at 10 am   The patient demonstrates the following risk factors for suicide: Chronic risk factors for suicide include: psychiatric disorder of anxiety. Acute risk factors for suicide include: unemployment. Protective factors for this patient include: positive social support, coping skills, and hope for the future. Considering these factors, the overall suicide risk at this point appears to be low. Patient is appropriate for outpatient follow up.   Collaboration of Care: Other reviewed notes in Epic  Patient/Guardian was advised Release of Information must be obtained prior to any record release in order to collaborate their care with an  outside provider. Patient/Guardian was advised if they have not already done so to contact the registration department to sign all necessary forms in order for us  to release information regarding their care.   A total of 77 minutes was spent on the following activities during the encounter date, which includes but is not limited to: preparing to see the patient (e.g., reviewing tests and records), obtaining and/or reviewing separately obtained history, performing a medically necessary examination or evaluation, counseling and educating the patient, family, or caregiver, ordering medications, tests, or procedures, referring and communicating with other healthcare professionals (when not reported separately), documenting clinical information in the electronic or paper health record, independently interpreting test or lab results and communicating these results to the family or caregiver, and coordinating care (when not reported separately).   Consent: Patient/Guardian gives verbal consent for treatment and assignment of benefits for services provided during this visit. Patient/Guardian expressed understanding and agreed to proceed.   Katheren Sleet, MD 8/5/20256:08 PM

## 2024-02-25 ENCOUNTER — Ambulatory Visit (INDEPENDENT_AMBULATORY_CARE_PROVIDER_SITE_OTHER): Admitting: Psychiatry

## 2024-02-25 ENCOUNTER — Encounter: Payer: Self-pay | Admitting: Psychiatry

## 2024-02-25 VITALS — BP 140/72 | HR 75 | Temp 98.3°F | Ht 72.0 in | Wt 204.0 lb

## 2024-02-25 DIAGNOSIS — F4322 Adjustment disorder with anxiety: Secondary | ICD-10-CM

## 2024-02-25 DIAGNOSIS — F1320 Sedative, hypnotic or anxiolytic dependence, uncomplicated: Secondary | ICD-10-CM

## 2024-02-25 DIAGNOSIS — Z79899 Other long term (current) drug therapy: Secondary | ICD-10-CM

## 2024-02-25 MED ORDER — CLONAZEPAM 1 MG PO TABS: 1.0000 mg | ORAL_TABLET | Freq: Every evening | ORAL | 1 refills | Status: DC

## 2024-02-25 MED ORDER — CLONAZEPAM 0.25 MG PO TBDP: 0.7500 mg | ORAL_TABLET | Freq: Every day | ORAL | 0 refills | Status: DC

## 2024-02-25 NOTE — Patient Instructions (Signed)
 Decrease clonazepam  0.75 mg in am, 1 mg in PM for one month, then decrease to 0.5 mg in AM, and 1 mg in PM Obtain lab (urine screening) Next appointment: 9/18 at 10 am

## 2024-02-26 ENCOUNTER — Other Ambulatory Visit: Payer: Self-pay | Admitting: Psychiatry

## 2024-02-26 ENCOUNTER — Ambulatory Visit: Payer: Self-pay | Admitting: Psychiatry

## 2024-02-26 LAB — MONITOR DRUG PROFILE 10(MW)
Amphetamine Scrn, Ur: NEGATIVE ng/mL
BARBITURATE SCREEN URINE: NEGATIVE ng/mL
BENZODIAZEPINE SCREEN, URINE: NEGATIVE ng/mL
CANNABINOIDS UR QL SCN: NEGATIVE ng/mL
Cocaine (Metab) Scrn, Ur: NEGATIVE ng/mL
Creatinine(Crt), U: 58.8 mg/dL (ref 20.0–300.0)
Methadone Screen, Urine: NEGATIVE ng/mL
OXYCODONE+OXYMORPHONE UR QL SCN: NEGATIVE ng/mL
Opiate Scrn, Ur: NEGATIVE ng/mL
Ph of Urine: 5.6 (ref 4.5–8.9)
Phencyclidine Qn, Ur: NEGATIVE ng/mL
Propoxyphene Scrn, Ur: NEGATIVE ng/mL

## 2024-02-26 MED ORDER — CLONAZEPAM 0.5 MG PO TABS: 0.5000 mg | ORAL_TABLET | Freq: Every day | ORAL | 0 refills | Status: AC | PRN

## 2024-02-27 ENCOUNTER — Telehealth: Payer: Self-pay

## 2024-02-27 NOTE — Telephone Encounter (Signed)
 No. We are planning to taper off clonazepam  gradually. The current plan is: 0.75 mg in the morning and 1 mg in the evening for one month, Then 0.5 mg in the morning and 1 mg in the evening. I submitted the 0.75 mg prescription a few days ago, so the current order should be correct and aligned with the plan.

## 2024-02-27 NOTE — Telephone Encounter (Signed)
 Chris with [facility] called with concerns about the patients  clonazePAM  (KLONOPIN ) 0.5 MG tablet  clonazePAM  (KLONOPIN ) 1 MG tablet He stated that the 1 mg has one refill remaining of 30 tablets and the 0.5 mg has a start date of 03-26-24 he would like to know if he shoule cancel the 1 mg and the refill please advise

## 2024-02-27 NOTE — Telephone Encounter (Signed)
 Called the pharmacy and spoke to [place] he voiced understanding and stated that he would make a note

## 2024-04-02 ENCOUNTER — Encounter: Payer: Self-pay | Admitting: Family Medicine

## 2024-04-02 NOTE — Telephone Encounter (Signed)
Please see the pt message below

## 2024-04-03 ENCOUNTER — Encounter: Admitting: Family Medicine
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
## 2024-04-06 ENCOUNTER — Ambulatory Visit (INDEPENDENT_AMBULATORY_CARE_PROVIDER_SITE_OTHER): Admitting: Family Medicine

## 2024-04-06 ENCOUNTER — Encounter: Payer: Self-pay | Admitting: Family Medicine

## 2024-04-06 VITALS — BP 136/75 | HR 62 | Ht 72.0 in | Wt 204.7 lb

## 2024-04-06 DIAGNOSIS — I10 Essential (primary) hypertension: Secondary | ICD-10-CM

## 2024-04-06 DIAGNOSIS — K581 Irritable bowel syndrome with constipation: Secondary | ICD-10-CM

## 2024-04-06 DIAGNOSIS — Z0001 Encounter for general adult medical examination with abnormal findings: Secondary | ICD-10-CM

## 2024-04-06 DIAGNOSIS — E7800 Pure hypercholesterolemia, unspecified: Secondary | ICD-10-CM | POA: Diagnosis not present

## 2024-04-06 DIAGNOSIS — R7303 Prediabetes: Secondary | ICD-10-CM | POA: Diagnosis not present

## 2024-04-06 DIAGNOSIS — Z23 Encounter for immunization: Secondary | ICD-10-CM | POA: Diagnosis not present

## 2024-04-06 DIAGNOSIS — C49A2 Gastrointestinal stromal tumor of stomach: Secondary | ICD-10-CM

## 2024-04-06 DIAGNOSIS — H9313 Tinnitus, bilateral: Secondary | ICD-10-CM

## 2024-04-06 DIAGNOSIS — Z0000 Encounter for general adult medical examination without abnormal findings: Secondary | ICD-10-CM

## 2024-04-06 DIAGNOSIS — C7A8 Other malignant neuroendocrine tumors: Secondary | ICD-10-CM | POA: Insufficient documentation

## 2024-04-06 NOTE — Assessment & Plan Note (Signed)
 Previously followed by GI Stable at this time

## 2024-04-06 NOTE — Assessment & Plan Note (Signed)
 Continue atorvastatin 

## 2024-04-06 NOTE — Assessment & Plan Note (Addendum)
 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

## 2024-04-06 NOTE — Assessment & Plan Note (Addendum)
 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 Clonazepam  used for over 30 years with perceived benefit in managing tinnitus and IBS-related stomach spasms. Significant impact on sleep and quality of life when not using clonazepam . ENT evaluation previously suggested surgery with a 50% success rate, which was declined per patient. - Continue current clonazepam  tapering plan per psych - Consider referral to ENT for further evaluation of tinnitus

## 2024-04-06 NOTE — Progress Notes (Signed)
 Annual Wellness Visit     Patient: Bobby Little, Male    DOB: 03-Feb-1946, 78 y.o.   MRN: [id number] Visit Date: 04/06/2024  Today's Provider: Jon Eva, MD   Chief Complaint  Patient presents with   Annual Exam    Last completed 01/12/22 Diet -  Low carb Exercise - limited due to back pain (spinal stenosis) Feeling - well  Sleeping - poorly Concerns -  discuss referral to [facility]. Wondered if there is anyone else he could see   Subjective    Bobby Little is a 78 y.o. male who presents today for his Annual Wellness Visit.  Discussed the use of AI scribe software for clinical note transcription with the patient, who gave verbal consent to proceed.  History of Present Illness   Bobby Little is a 78 year old male who presents for a wellness visit.  He experiences irritable bowel syndrome with constipation, using Miralax for relief, resulting in large, clumpy bowel movements. Bowel movements are often stringy, and bright red blood is occasionally present. He manages symptoms with Miralax and olive oil. Linzess  caused diarrhea, and dicyclomine  is not suitable due to constipation. Occasional lower abdominal pain occurs.  He has tinnitus managed with clonazepam , currently at 1.5 mg per day, with occasional additional doses for severe symptoms. Attempts to discontinue clonazepam  have been unsuccessful due to exacerbated tinnitus and sleep disruption.  He quit smoking cigars a few months ago and has a history of working in a [employer], which he believes contributed to lung scarring. No current respiratory symptoms are present.             Medications: Outpatient Medications Prior to Visit  Medication Sig   acetaminophen (TYLENOL) 500 MG tablet Take 500 mg by mouth.   amLODipine  (NORVASC ) 5 MG tablet Take 1 tablet (5 mg total) by mouth daily.   atorvastatin  (LIPITOR) 40 MG tablet Take 1 tablet (40 mg total) by mouth daily.    clonazePAM  (KLONOPIN ) 0.5 MG tablet Take 1 tablet (0.5 mg total) by mouth daily as needed for anxiety.   clonazePAM  (KLONOPIN ) 1 MG tablet Take 1 tablet (1 mg total) by mouth every evening.   ibuprofen (ADVIL) 200 MG tablet Take 200 mg by mouth.   lidocaine  (LMX) 4 % cream Apply 1 Application topically as needed.   naproxen sodium (ALEVE) 220 MG tablet Take 220 mg by mouth daily as needed. 3 times weekly   olmesartan  (BENICAR ) 40 MG tablet Take 1 tablet (40 mg total) by mouth daily.   [DISCONTINUED] clonazePAM  (KLONOPIN ) 0.25 MG disintegrating tablet Take 3 tablets (0.75 mg total) by mouth daily.   [DISCONTINUED] clonazePAM  (KLONOPIN ) 1 MG tablet Take 1 mg by mouth 2 (two) times daily. Taking 1/2 tablet once a day   No facility-administered medications prior to visit.    Allergies  Allergen Reactions   Beta Adrenergic Blockers     Dizziness, low heart rate   Codeine Nausea And Vomiting   Trazodone  And Nefazodone Other (See Comments)    insomnia    Patient Care Team: Eva Jon HERO, MD as PCP - General (Family Medicine)  Review of Systems       Objective    Vitals: BP 136/75 (BP Location: Left Arm, Patient Position: Sitting, Cuff Size: Normal)   Pulse 62   Ht 6' (1.829 m)   Wt 204 lb 11.2 oz (92.9 kg)   SpO2 100%   BMI 27.76 kg/m  Physical Exam Vitals reviewed.  Constitutional:      General: He is not in acute distress.    Appearance: Normal appearance. He is well-developed. He is not diaphoretic.  HENT:     Head: Normocephalic and atraumatic.     Right Ear: Tympanic membrane, ear canal and external ear normal.     Left Ear: Tympanic membrane, ear canal and external ear normal.     Nose: Nose normal.     Mouth/Throat:     Mouth: Mucous membranes are moist.     Pharynx: Oropharynx is clear. No oropharyngeal exudate.  Eyes:     General: No scleral icterus.    Conjunctiva/sclera: Conjunctivae normal.     Pupils: Pupils are equal, round, and reactive to  light.  Neck:     Thyroid: No thyromegaly.  Cardiovascular:     Rate and Rhythm: Normal rate and regular rhythm.     Heart sounds: Normal heart sounds. No murmur heard. Pulmonary:     Effort: Pulmonary effort is normal. No respiratory distress.     Breath sounds: Normal breath sounds. No wheezing or rales.  Abdominal:     General: There is no distension.     Palpations: Abdomen is soft.     Tenderness: There is no abdominal tenderness.  Musculoskeletal:        General: No deformity.     Cervical back: Neck supple.     Right lower leg: No edema.     Left lower leg: No edema.  Lymphadenopathy:     Cervical: No cervical adenopathy.  Skin:    General: Skin is warm and dry.     Findings: No rash.  Neurological:     Mental Status: He is alert and oriented to person, place, and time. Mental status is at baseline.     Gait: Gait normal.  Psychiatric:        Mood and Affect: Mood normal.        Behavior: Behavior normal.        Thought Content: Thought content normal.     Most recent functional status assessment:    04/04/2024   10:07 AM  In your present state of health, do you have any difficulty performing the following activities:  Hearing? 0  Vision? 0  Difficulty concentrating or making decisions? 0  Walking or climbing stairs? 1  Dressing or bathing? 0  Doing errands, shopping? 1  Preparing Food and eating ? N  Using the Toilet? Y  In the past six months, have you accidently leaked urine? N  Do you have problems with loss of bowel control? Y  Managing your Medications? N  Managing your Finances? N  Housekeeping or managing your Housekeeping? Y   Most recent fall risk assessment:    04/04/2024   10:07 AM  Fall Risk   Falls in the past year? 1  Number falls in past yr: 0  Injury with Fall? 0    Most recent depression screenings:    02/25/2024    6:06 PM 02/25/2024   12:58 PM  PHQ 2/9 Scores  PHQ - 2 Score 1 1   Most recent cognitive screening:    04/06/2024     1:51 PM  6CIT Screen  What Year? 0 points  What month? 0 points  What time? 0 points  Count back from 20 0 points  Months in reverse 0 points  Repeat phrase 2 points  Total Score 2 points   Most recent Audit-C alcohol  use screening    01/19/2024    9:42 AM  Alcohol Use Disorder Test (AUDIT)  1. How often do you have a drink containing alcohol? 0  3. How often do you have six or more drinks on one occasion? 0   A score of 3 or more in women, and 4 or more in men indicates increased risk for alcohol abuse, EXCEPT if all of the points are from question 1   No results found.  No results found for any visits on 04/06/24.  Assessment & Plan     Annual wellness visit done today including the all of the following: Reviewed patient's Family Medical History Reviewed and updated list of patient's medical providers Assessment of cognitive impairment was done Assessed patient's functional ability Established a written schedule for health screening services Health Risk Assessent Completed and Reviewed  Exercise Activities and Dietary recommendations  Goals      RNCM: Effective Management of Chronic Pain     Care Coordination Interventions: Rates pain in his back and hips today at 9 today. States today is a bad day.  Reviewed provider established plan for pain management. The patient sees a pain specialist and has had injections. He has good days and bad days. The patient was having a bad day today. Even when he is having bad days he still is active as he can be. Denies any issues with with falls or safety concerns. He does carry a cane with him when going out just in case he needs it.  Discussed importance of adherence to all scheduled medical appointments. The patient goes next week for vaccinations. Education and support provided. Will attach vaccine information to the AVS. Will see pcp on 07-09-2022 at 1040 am Counseled on the importance of reporting any/all new or changed pain  symptoms or management strategies to pain management provider Advised patient to report to care team affect of pain on daily activities Discussed use of relaxation techniques and/or diversional activities to assist with pain reduction (distraction, imagery, relaxation, massage, acupressure, TENS, heat, and cold application. The patient does use a heating pad and has a TENS unit. Education and support  Reviewed with patient prescribed pharmacological and nonpharmacological pain relief strategies. The patient takes medications as prescribed. Had injections In August that last until a couple of days ago in his hips Advised patient to discuss unresolved pain, changes in level or intensity of pain with provider Screening for signs and symptoms of depression related to chronic disease state  Assessed social determinant of health barriers Reviewed with the patient the goals of the care for the care coordination program and the availability of the [facility] and other resources.           Immunization History  Administered Date(s) Administered   Fluad Quad(high Dose 65+) 05/20/2020, 04/12/2021, 04/17/2022   Fluad Trivalent(High Dose 65+) 04/04/2023   INFLUENZA, HIGH DOSE SEASONAL PF 04/06/2024   Influenza,inj,Quad PF,6+ Mos 04/02/2017   Meningococcal B, OMV 05/20/2020, 06/04/2022   Meningococcal Mcv4o 05/20/2020   PFIZER(Purple Top)SARS-COV-2 Vaccination 09/07/2019, 10/06/2019, 05/04/2020   PNEUMOCOCCAL CONJUGATE-20 05/01/2022   Pfizer(Comirnaty)Fall Seasonal Vaccine 12 years and older 07/30/2022   Pneumococcal Conjugate-13 11/13/2013   Pneumococcal Polysaccharide-23 04/02/2017   Td 05/02/1999   Tdap 05/28/2018    Health Maintenance  Topic Date Due   Zoster Vaccines- Shingrix (1 of 2) Never done   Meningococcal B Vaccine (3 of 4 - Increased Risk Bexsero 3-dose series) 06/05/2023   COVID-19 Vaccine (5 - 2025-26 season) 03/23/2024  Medicare Annual Wellness (AWV)  04/06/2025   DTaP/Tdap/Td (3 -  Td or Tdap) 05/28/2028   Colonoscopy  11/10/2028   Pneumococcal Vaccine: 50+ Years  Completed   Influenza Vaccine  Completed   Hepatitis C Screening  Completed   HPV VACCINES  Aged Out     Discussed health benefits of physical activity, and encouraged him to engage in regular exercise appropriate for his age and condition.    Problem List Items Addressed This Visit       Cardiovascular and Mediastinum   Essential hypertension   Well controlled Continue amlodipine  5mg  daily and olmesartan  40 mg daily Recheck metabolic panel      Relevant Orders   Comprehensive metabolic panel with GFR     Digestive   GIST, malignant (HCC)   Previously followed by GI Stable at this time      Irritable bowel syndrome with constipation   Chronic IBS with constipation managed with Miralax and olive oil. Linzess  previously caused diarrhea. Reports stringy and clumpy stools, occasional lower abdominal pain, and no normal bowel movements since colonoscopy two years ago. 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 Clonazepam  used for over 30 years with perceived benefit in managing tinnitus and IBS-related stomach spasms. Significant impact on sleep and quality of life when not using clonazepam . ENT evaluation previously suggested surgery with a 50% success rate, which was declined per patient. - Continue current clonazepam  tapering plan per psych - Consider referral to  ENT for further evaluation of tinnitus      Relevant Orders   Ambulatory referral to ENT   Other Visit Diagnoses       Encounter for annual wellness visit (AWV) in Medicare patient    -  Primary     Immunization due       Relevant Orders   Flu vaccine HIGH DOSE PF(Fluzone Trivalent) (Completed)          Adult Wellness Visit Routine wellness visit. - Administer flu shot - Perform routine labs including kidney and liver function, cholesterol, and A1c      Return in about 6 months (around 10/04/2024) for chronic disease f/u.     Jon Eva, MD  [facility] [phone] (phone) [phone] (fax)  [facility]

## 2024-04-06 NOTE — Telephone Encounter (Signed)
 Will discuss at visit

## 2024-04-06 NOTE — Assessment & Plan Note (Signed)
 Well controlled Continue amlodipine  5mg  daily and olmesartan  40 mg daily Recheck metabolic panel

## 2024-04-06 NOTE — Assessment & Plan Note (Signed)
 Recommend low carb diet Recheck A1c

## 2024-04-06 NOTE — Assessment & Plan Note (Signed)
 Chronic IBS with constipation managed with Miralax and olive oil. Linzess  previously caused diarrhea. Reports stringy and clumpy stools, occasional lower abdominal pain, and no normal bowel movements since colonoscopy two years ago. - Continue Miralax as needed

## 2024-04-06 NOTE — Patient Instructions (Signed)
 I strongly recommend two doses of Shingrix (the shingles vaccine) separated by 2 to 6 months for adults age 78 years and older. I recommend checking with your insurance plan regarding coverage for this vaccine.

## 2024-04-07 ENCOUNTER — Ambulatory Visit: Payer: Self-pay | Admitting: Family Medicine

## 2024-04-07 LAB — LIPID PANEL WITH LDL/HDL RATIO
Cholesterol, Total: 193 mg/dL (ref 100–199)
HDL: 56 mg/dL (ref >39)
LDL Chol Calc (NIH): 116 mg/dL — ABNORMAL HIGH (ref 0–99)
LDL/HDL Ratio: 2.1 ratio (ref 0.0–3.6)
Triglycerides: 116 mg/dL (ref 0–149)
VLDL Cholesterol Cal: 21 mg/dL (ref 5–40)

## 2024-04-07 LAB — COMPREHENSIVE METABOLIC PANEL WITH GFR
ALT: 24 IU/L (ref 0–44)
AST: 24 IU/L (ref 0–40)
Albumin: 4.4 g/dL (ref 3.8–4.8)
Alkaline Phosphatase: 125 IU/L — ABNORMAL HIGH (ref 47–123)
BUN/Creatinine Ratio: 15 (ref 10–24)
BUN: 17 mg/dL (ref 8–27)
Bilirubin Total: 0.5 mg/dL (ref 0.0–1.2)
CO2: 21 mmol/L (ref 20–29)
Calcium: 9.3 mg/dL (ref 8.6–10.2)
Chloride: 102 mmol/L (ref 96–106)
Creatinine, Ser: 1.11 mg/dL (ref 0.76–1.27)
Globulin, Total: 2.6 g/dL (ref 1.5–4.5)
Glucose: 106 mg/dL — ABNORMAL HIGH (ref 70–99)
Potassium: 4.5 mmol/L (ref 3.5–5.2)
Sodium: 138 mmol/L (ref 134–144)
Total Protein: 7 g/dL (ref 6.0–8.5)
eGFR: 68 mL/min/1.73 (ref >59)

## 2024-04-07 LAB — HEMOGLOBIN A1C
Est. average glucose Bld gHb Est-mCnc: 126 mg/dL
Hgb A1c MFr Bld: 6 % — ABNORMAL HIGH (ref 4.8–5.6)

## 2024-04-09 ENCOUNTER — Other Ambulatory Visit: Payer: Self-pay

## 2024-04-09 ENCOUNTER — Encounter: Payer: Self-pay | Admitting: Psychiatry

## 2024-04-09 ENCOUNTER — Ambulatory Visit (INDEPENDENT_AMBULATORY_CARE_PROVIDER_SITE_OTHER): Admitting: Psychiatry

## 2024-04-09 VITALS — BP 150/83 | HR 77 | Temp 97.7°F | Ht 72.0 in | Wt 203.4 lb

## 2024-04-09 DIAGNOSIS — F1320 Sedative, hypnotic or anxiolytic dependence, uncomplicated: Secondary | ICD-10-CM

## 2024-04-09 DIAGNOSIS — F4322 Adjustment disorder with anxiety: Secondary | ICD-10-CM | POA: Diagnosis not present

## 2024-04-09 DIAGNOSIS — Z79899 Other long term (current) drug therapy: Secondary | ICD-10-CM

## 2024-04-09 MED ORDER — CLONAZEPAM 0.5 MG PO TABS: 0.5000 mg | ORAL_TABLET | Freq: Two times a day (BID) | ORAL | 0 refills | Status: DC | PRN

## 2024-04-09 NOTE — Patient Instructions (Signed)
 Decrease clonazepam  0.5 mg twice a day for one month, then 0.5 mg at night  Next appointment: 11/13 at 10 am

## 2024-04-30 ENCOUNTER — Other Ambulatory Visit (HOSPITAL_COMMUNITY): Payer: Self-pay | Admitting: Psychiatry

## 2024-04-30 NOTE — Telephone Encounter (Signed)
 Its too soon to refill

## 2024-05-28 ENCOUNTER — Other Ambulatory Visit: Payer: Self-pay | Admitting: Psychiatry

## 2024-05-29 ENCOUNTER — Other Ambulatory Visit: Payer: Self-pay | Admitting: Psychiatry

## 2024-05-29 ENCOUNTER — Encounter: Payer: Self-pay | Admitting: Psychiatry

## 2024-05-29 ENCOUNTER — Encounter: Payer: Self-pay | Admitting: Family Medicine

## 2024-05-29 ENCOUNTER — Telehealth: Admitting: Psychiatry

## 2024-05-29 DIAGNOSIS — F4322 Adjustment disorder with anxiety: Secondary | ICD-10-CM

## 2024-05-29 DIAGNOSIS — F1320 Sedative, hypnotic or anxiolytic dependence, uncomplicated: Secondary | ICD-10-CM | POA: Diagnosis not present

## 2024-05-29 DIAGNOSIS — Z79899 Other long term (current) drug therapy: Secondary | ICD-10-CM

## 2024-05-29 MED ORDER — CLONAZEPAM 1 MG PO TABS: 1.0000 mg | ORAL_TABLET | Freq: Every day | ORAL | 0 refills | Status: DC
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
## 2024-06-03 NOTE — Progress Notes (Signed)
 BH MD/PA/NP OP Progress Note  06/08/2024 12:52 PM Bobby Little  MRN:  [id number]  Chief Complaint:  Chief Complaint  Patient presents with   Follow-up   HPI:  This is a follow-up appointment for adjustment disorder with anxiety, benzodiazepine dependence.  He states that he has been sleeping better since being back on the original dose of clonazepam .  Tinnitus is gone, and he does not have a headache anymore.  He experiences back pain secondary to spinal stenosis, and he was advised by his provider not to proceed with surgery.  He states that pain medication provided him with limited benefit.  He did not take hydrocodone when he was prescribed due to possible adverse reaction with clonazepam .  It has been very difficult to do things due to pain.  He states that he thinks his mind is clear, and wants to do things if you are not to have the back pain.  He enjoys mowing yard.  He has a garden, and has a variety of fruits and vegetables, including peach, blueberries.  He reports most of his mother last month.  He denies feeling depressed.  Although his son disowned me, he states that he has his own family.  He finds spirituality to be important.  He states that he has been on clonazepam  for many years without any issues.  He states that he will be in a wheelchair next year due to his back, and he wonders what will be the issues as he might be gone soon considering his age. He denies SI. He agrees with the plans as below.   Wt Readings from Last 3 Encounters:  06/08/24 209 lb (94.8 kg)  04/09/24 203 lb 6.4 oz (92.3 kg)  04/06/24 204 lb 11.2 oz (92.9 kg)     Visit Diagnosis:    ICD-10-CM   1. Adjustment disorder with anxiety  F43.22     2. Benzodiazepine dependence (HCC)  F13.20     3. High risk medication use  Z79.899       Past Psychiatric History: Please see initial evaluation for full details. I have reviewed the history. No updates at this time.     Past Medical History:  Past  Medical History:  Diagnosis Date   Anxiety    x 20 y per pt   Arthritis    Degenerative disc disease, lumbar    Depression    Diverticulitis    Family history of polyps in the colon    Gastrointestinal stromal tumor (GIST) (HCC)    GIST (gastrointestinal stroma tumor), malignant, colon (HCC)    GIST (gastrointestinal stroma tumor), malignant, colon (HCC)    Hay fever/allergies    Hepatic cyst    History of spinal fusion for scoliosis    Hypertension    Neuromuscular disorder (HCC) ?????   Numbness in feet   Polycystic kidney    Prediabetes    Spinal stenosis of lumbar region     Past Surgical History:  Procedure Laterality Date   COLONOSCOPY WITH PROPOFOL  N/A 11/22/2016   Procedure: COLONOSCOPY WITH PROPOFOL ;  Surgeon: Ruel Kung, MD;  Location: [facility] ENDOSCOPY;  Service: Endoscopy;  Laterality: N/A;   COLONOSCOPY WITH PROPOFOL  N/A 11/10/2021   Procedure: COLONOSCOPY WITH PROPOFOL ;  Surgeon: Kung Ruel, MD;  Location: [facility] ENDOSCOPY;  Service: Gastroenterology;  Laterality: N/A;   KNEE ARTHROCENTESIS Right    PANCREAS SURGERY  10/2013   PARTIAL GASTRECTOMY     REMOVAL OF GASTROINTESTINAL STOMATIC  TUMOR OF STOMACH  10/2013  SPLENECTOMY  10/2013   TUMOR REMOVAL     stomach, slpeen, and pancreas    Family Psychiatric History: Please see initial evaluation for full details. I have reviewed the history. No updates at this time.     Family History:  Family History  Problem Relation Age of Onset   Hypertension Mother    Lymphoma Mother    Cancer Mother    Hypertension Father    Valvular heart disease Father    Dementia Father    Arthritis Father    Stroke Maternal Grandfather    Prostate cancer Paternal Grandfather    Stroke Paternal Grandfather    Coronary artery disease Sister    Coronary artery disease Brother    Anxiety disorder Son    Colon cancer Neg Hx     Social History:  Social History   Socioeconomic History   Marital status: Married    Spouse name:  Not on file   Number of children: 2   Years of education: Not on file   Highest education level: Bachelor's degree (e.g., BA, AB, BS)  Occupational History   Occupation: retired  Tobacco Use   Smoking status: Former    Types: Cigars, Cigarettes    Quit date: 07/29/2021    Years since quitting: 2.8   Smokeless tobacco: Never   Tobacco comments:    1 cigar 3-4 times year  Vaping Use   Vaping status: Never Used  Substance and Sexual Activity   Alcohol use: No   Drug use: No   Sexual activity: Not Currently    Partners: Female  Other Topics Concern   Not on file  Social History Narrative   Not on file   Social Drivers of Health   Financial Resource Strain: Medium Risk (01/19/2024)   Overall Financial Resource Strain (CARDIA)    Difficulty of Paying Living Expenses: Somewhat hard  Food Insecurity: Patient Declined (01/19/2024)   Hunger Vital Sign    Worried About Running Out of Food in the Last Year: Patient declined    Ran Out of Food in the Last Year: Patient declined  Transportation Needs: No Transportation Needs (01/19/2024)   PRAPARE - Administrator, Civil Service (Medical): No    Lack of Transportation (Non-Medical): No  Physical Activity: Unknown (01/19/2024)   Exercise Vital Sign    Days of Exercise per Week: Patient declined    Minutes of Exercise per Session: Not on file  Stress: No Stress Concern Present (01/19/2024)   [employer] of Occupational Health - Occupational Stress Questionnaire    Feeling of Stress: Only a little  Social Connections: Unknown (01/19/2024)   Social Connection and Isolation Panel    Frequency of Communication with Friends and Family: Twice a week    Frequency of Social Gatherings with Friends and Family: Patient declined    Attends Religious Services: More than 4 times per year    Active Member of [employer] or Organizations: Patient declined    Attends Banker Meetings: Not on file    Marital Status: Married     Allergies:  Allergies  Allergen Reactions   Beta Adrenergic Blockers     Dizziness, low heart rate   Codeine Nausea And Vomiting   Trazodone  And Nefazodone Other (See Comments)    insomnia    Metabolic Disorder Labs: Lab Results  Component Value Date   HGBA1C 6.0 (H) 04/06/2024   No results found for: PROLACTIN Lab Results  Component Value Date   CHOL  193 04/06/2024   TRIG 116 04/06/2024   HDL 56 04/06/2024   CHOLHDL 2.9 09/27/2023   LDLCALC 116 (H) 04/06/2024   LDLCALC 82 09/27/2023   Lab Results  Component Value Date   TSH 3.140 04/03/2021    Therapeutic Level Labs: No results found for: LITHIUM No results found for: VALPROATE No results found for: CBMZ  Current Medications: Current Outpatient Medications  Medication Sig Dispense Refill   acetaminophen (TYLENOL) 500 MG tablet Take 500 mg by mouth.     amLODipine  (NORVASC ) 5 MG tablet Take 1 tablet (5 mg total) by mouth daily. 90 tablet 3   atorvastatin  (LIPITOR) 40 MG tablet Take 1 tablet (40 mg total) by mouth daily. 90 tablet 2   clonazePAM  (KLONOPIN ) 0.5 MG tablet Take 1 tablet (0.5 mg total) by mouth daily as needed for anxiety. 30 tablet 0   ibuprofen (ADVIL) 200 MG tablet Take 200 mg by mouth.     lidocaine  (LMX) 4 % cream Apply 1 Application topically as needed.     naproxen sodium (ALEVE) 220 MG tablet Take 220 mg by mouth daily as needed. 3 times weekly     olmesartan  (BENICAR ) 40 MG tablet Take 1 tablet (40 mg total) by mouth daily. 90 tablet 1   [START ON 06/27/2024] clonazePAM  (KLONOPIN ) 0.5 MG tablet Take 1 tablet (0.5 mg total) by mouth at bedtime. 30 tablet 1   [START ON 06/28/2024] clonazePAM  (KLONOPIN ) 1 MG tablet Take 1 tablet (1 mg total) by mouth daily. 30 tablet 1   No current facility-administered medications for this visit.     Musculoskeletal: Strength & Muscle Tone: within normal limits Gait & Station: normal Patient leans: N/A  Psychiatric Specialty Exam: Review of  Systems  Psychiatric/Behavioral:  Negative for agitation, behavioral problems, confusion, decreased concentration, dysphoric mood, hallucinations, self-injury, sleep disturbance and suicidal ideas. The patient is not nervous/anxious and is not hyperactive.   All other systems reviewed and are negative.   Blood pressure 118/68, pulse 78, temperature 98.1 F (36.7 C), temperature source Temporal, height 5' 11.5 (1.816 m), weight 209 lb (94.8 kg), SpO2 98%.Body mass index is 28.74 kg/m.  General Appearance: Well Groomed  Eye Contact:  Good  Speech:  Clear and Coherent  Volume:  Normal  Mood:  good  Affect:  Appropriate, Congruent, and calm  Thought Process:  Coherent  Orientation:  Full (Time, Place, and Person)  Thought Content: Logical   Suicidal Thoughts:  No  Homicidal Thoughts:  No  Memory:  Immediate;   Good  Judgement:  Good  Insight:  Good  Psychomotor Activity:  Normal  Concentration:  Concentration: Good and Attention Span: Good  Recall:  Good  Fund of Knowledge: Good  Language: Good  Akathisia:  No  Handed:  Right  AIMS (if indicated): not done  Assets:  Communication Skills Desire for Improvement  ADL's:  Intact  Cognition: WNL  Sleep:  Good   Screenings: GAD-7    Flowsheet Row Office Visit from 06/08/2024 in [facility] Office Visit from 02/25/2024 in [facility] Office Visit from 01/23/2024 in [facility] Office Visit from 09/27/2023 in [facility] Office Visit from 05/02/2023 in [facility]  Total GAD-7 Score 2 2 8 4 4    PHQ2-9    Flowsheet Row Office Visit from 06/08/2024 in [facility] Office Visit from 02/25/2024 in [facility] Office Visit from 01/23/2024 in [facility] Office Visit from 09/27/2023 in  [facility] Office Visit from 05/02/2023 in [facility]  PHQ-2 Total Score 1 1 2 3 1   PHQ-9 Total Score -- -- 4 8 9    Flowsheet Row Admission (Discharged) from 11/10/2021 in [facility]  C-SSRS RISK CATEGORY No Risk     Assessment and Plan:  Bobby Little is a 78 y.o. 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    1. Adjustment disorder with anxiety The patient has been on clonazepam  for approximately 30 years, prescribed by Dr. Daniel for management of chronic anxiety, tinnitus, and insomnia secondary to pain. He has a psychosocial history notable for significant stress related to a past divorce and ongoing financial strain, and chronic back pain.  History: no admission. Originally on clonazepam  1 mg BID  He denies any anxiety since being back on original dose of clonazepam .  Although he was amenable to try tapering off clonazepam , he had significant worsening in tinnitus, and now reports anxiety associated with this.  Although it is advisable to try antidepressant to avoid long-term use of clonazepam , will not proceed with this at this time but will continue to assess its need.    2. Benzodiazepine dependence (HCC) - on clonazepam  for over 30 years, which was started for tinnitus. No known history of seizures He had a worsening in tinnitus when he tried to taper down this medication.  He now reports anxiety associated with tapering down clonazepam . His ENT provider is reportedly aware of his condition, and he denies any additional intervention from them.  Will maintain on the current dose of clonazepam  at this time.   Provided psychoeducation at length regarding the risk of drowsiness, fall, dependence and tolerance.   3. High risk medication use - UDS negative 02/2024   He expressed understanding that clonazepam  will not be ordered if any concern of misuse of this medication, or any other substance use.     Plan Continue clonazepam  1 mg daily, 0.5 mg at night (0.5 mg BID caused worsening in tinnitus, insomnia, headache) Next appointment:  1/26 at 11:30, IP   The patient demonstrates the following risk factors for suicide: Chronic risk factors for suicide include: psychiatric disorder of anxiety. Acute risk factors for suicide include: unemployment. Protective factors for this patient include: positive social support, coping skills, and hope for the future. Considering these factors, the overall suicide risk at this point appears to be low. Patient is appropriate for outpatient follow up.   A total of 30 minutes was spent on the following activities during the encounter date, which includes but is not limited to: preparing to see the patient (e.g., reviewing tests and records), obtaining and/or reviewing separately obtained history, performing a medically necessary examination or evaluation, counseling and educating the patient, family, or caregiver, ordering medications, tests, or procedures, referring and communicating with other healthcare professionals (when not reported separately), documenting clinical information in the electronic or paper health record, independently interpreting test or lab results and communicating these results to the family or caregiver, and coordinating care (when not reported separately).   Collaboration of Care: Collaboration of Care: Other  reviewed notes in Epic  Patient/Guardian was advised Release of Information must be obtained prior to any record release in order to collaborate their care with an outside provider. Patient/Guardian was advised if they have not already done so to  contact the registration department to sign all necessary forms in order for us  to release information regarding their care.   Consent: Patient/Guardian gives verbal consent for treatment and assignment of benefits for services provided during this visit. Patient/Guardian expressed understanding and agreed to proceed.    Katheren Sleet, MD 06/08/2024, 12:52 PM

## 2024-06-04 ENCOUNTER — Ambulatory Visit: Admitting: Psychiatry

## 2024-06-08 ENCOUNTER — Ambulatory Visit (INDEPENDENT_AMBULATORY_CARE_PROVIDER_SITE_OTHER): Admitting: Psychiatry

## 2024-06-08 ENCOUNTER — Encounter: Payer: Self-pay | Admitting: Psychiatry

## 2024-06-08 VITALS — BP 118/68 | HR 78 | Temp 98.1°F | Ht 71.5 in | Wt 209.0 lb

## 2024-06-08 DIAGNOSIS — Z79899 Other long term (current) drug therapy: Secondary | ICD-10-CM

## 2024-06-08 DIAGNOSIS — F1320 Sedative, hypnotic or anxiolytic dependence, uncomplicated: Secondary | ICD-10-CM

## 2024-06-08 DIAGNOSIS — F4322 Adjustment disorder with anxiety: Secondary | ICD-10-CM | POA: Diagnosis not present

## 2024-06-08 MED ORDER — CLONAZEPAM 0.5 MG PO TABS: 0.5000 mg | ORAL_TABLET | Freq: Every day | ORAL | 1 refills | Status: DC

## 2024-06-08 MED ORDER — CLONAZEPAM 1 MG PO TABS: 1.0000 mg | ORAL_TABLET | Freq: Every day | ORAL | 1 refills | Status: DC

## 2024-06-08 NOTE — Patient Instructions (Signed)
 Continue clonazepam  1 mg daily, 0.5 mg at night  Next appointment:  1/26 at 11:30

## 2024-06-10 ENCOUNTER — Encounter: Payer: Self-pay | Admitting: Family Medicine

## 2024-06-10 DIAGNOSIS — F411 Generalized anxiety disorder: Secondary | ICD-10-CM

## 2024-06-21 ENCOUNTER — Encounter: Payer: Self-pay | Admitting: Family Medicine

## 2024-06-22 NOTE — Telephone Encounter (Signed)
 Ok to send psych referral (use anxiety diagnosis) and put [facility] or [facility] in the comments

## 2024-06-22 NOTE — Addendum Note (Signed)
 Addended by: LILIAN SEVERO RAMAN on: 06/22/2024 11:08 AM   Modules accepted: Orders

## 2024-06-24 NOTE — Telephone Encounter (Signed)
 Noted. I recommend that patient follow up with Dr. Marcelino about this.

## 2024-07-10 ENCOUNTER — Encounter: Payer: Self-pay | Admitting: Family Medicine

## 2024-07-20 NOTE — Telephone Encounter (Signed)
 noted

## 2024-07-21 NOTE — Telephone Encounter (Signed)
 It is fine to do virtual this time.

## 2024-07-29 ENCOUNTER — Other Ambulatory Visit: Payer: Self-pay | Admitting: Family Medicine

## 2024-07-29 DIAGNOSIS — E7800 Pure hypercholesterolemia, unspecified: Secondary | ICD-10-CM

## 2024-07-29 DIAGNOSIS — I10 Essential (primary) hypertension: Secondary | ICD-10-CM

## 2024-07-29 NOTE — Telephone Encounter (Signed)
 Copied from CRM #[id number]. Topic: Clinical - Medication Refill >> Jul 29, 2024  8:51 AM [name] wrote: Medication: olmesartan  (BENICAR ) 40 MG tablet atorvastatin  (LIPITOR) 40 MG tablet  amLODipine  (NORVASC ) 5 MG tablet  -Patient wants to know If he can get the olmesarten sent to [facility] pharmacy as he stated the prescription may not be here by the time he runs out   Has the patient contacted their pharmacy? Yes (Agent: If no, request that the patient contact the pharmacy for the refill. If patient does not wish to contact the pharmacy document the reason why and proceed with request.) (Agent: If yes, when and what did the pharmacy advise?)  This is the patient's preferred pharmacy:  [facility] - [name], [place] - [address] [place] [address] Phone: [phone] Fax: [phone]  [facility] - [place], [place] - [address] [place] [address] Phone: [phone] Fax: [phone] Hours: Not open 24 hours    Is this the correct pharmacy for this prescription? Yes If no, delete pharmacy and type the correct one.   Has the prescription been filled recently? No  Is the patient out of the medication? Yes  Has the patient been seen for an appointment in the last year OR does the patient have an upcoming appointment? Yes  Can we respond through MyChart? Yes  Agent: Please be advised that Rx refills may take up to 3 business days. We ask that you follow-up with your pharmacy.

## 2024-07-30 MED ORDER — OLMESARTAN MEDOXOMIL 40 MG PO TABS: 40.0000 mg | ORAL_TABLET | Freq: Every day | ORAL | 0 refills | Status: DC

## 2024-07-30 NOTE — Telephone Encounter (Signed)
 Refilling Olesartan to [facility] pharmacy, per patient request.  Requested Prescriptions  Pending Prescriptions Disp Refills   olmesartan  (BENICAR ) 40 MG tablet 90 tablet 0    Sig: Take 1 tablet (40 mg total) by mouth daily.     Cardiovascular:  Angiotensin Receptor Blockers Passed - 07/30/2024 12:18 PM      Passed - Cr in normal range and within 180 days    Creatinine, Ser  Date Value Ref Range Status  04/06/2024 1.11 0.76 - 1.27 mg/dL Final         Passed - K in normal range and within 180 days    Potassium  Date Value Ref Range Status  04/06/2024 4.5 3.5 - 5.2 mmol/L Final         Passed - Patient is not pregnant      Passed - Last BP in normal range    BP Readings from Last 1 Encounters:  04/06/24 136/75         Passed - Valid encounter within last 6 months    Recent Outpatient Visits           3 months ago Encounter for annual wellness visit (AWV) in Medicare patient   [place] [facility] [place], Jon HERO, MD   6 months ago Essential hypertension   [place] [facility] [place], Jon HERO, MD   7 months ago Essential hypertension   [place] [facility] [place], [place], [place]   10 months ago Essential hypertension   [place] [facility] [place], Jon HERO, MD               amLODipine  (NORVASC ) 5 MG tablet 90 tablet 3    Sig: Take 1 tablet (5 mg total) by mouth daily.     Cardiovascular: Calcium  Channel Blockers 2 Passed - 07/30/2024 12:18 PM      Passed - Last BP in normal range    BP Readings from Last 1 Encounters:  04/06/24 136/75         Passed - Last Heart Rate in normal range    Pulse Readings from Last 1 Encounters:  04/06/24 62         Passed - Valid encounter within last 6 months    Recent Outpatient Visits           3 months ago Encounter for annual wellness visit (AWV) in Medicare patient   [place] [facility] [place], Jon HERO, MD    6 months ago Essential hypertension   [facility] [place], Jon HERO, MD   7 months ago Essential hypertension   [place] [facility], [place], MD   10 months ago Essential hypertension   [place] [facility] [place], Jon HERO, MD               atorvastatin  (LIPITOR) 40 MG tablet 90 tablet 2    Sig: Take 1 tablet (40 mg total) by mouth daily.     Cardiovascular:  Antilipid - Statins Failed - 07/30/2024 12:18 PM      Failed - Lipid Panel in normal range within the last 12 months    Cholesterol, Total  Date Value Ref Range Status  04/06/2024 193 100 - 199 mg/dL Final   LDL Chol Calc (NIH)  Date Value Ref Range Status  04/06/2024 116 (H) 0 - 99 mg/dL Final   HDL  Date Value Ref Range Status  04/06/2024 56 >39 mg/dL Final  Triglycerides  Date Value Ref Range Status  04/06/2024 116 0 - 149 mg/dL Final         Passed - Patient is not pregnant      Passed - Valid encounter within last 12 months    Recent Outpatient Visits           3 months ago Encounter for annual wellness visit (AWV) in Medicare patient   [place] [facility] [place], Jon HERO, MD   6 months ago Essential hypertension   [place] [facility] [place], Jon HERO, MD   7 months ago Essential hypertension   [place] [facility] [place]Rob[place], [place], MD   10 months ago Essential hypertension    [facility] [place], Jon HERO, MD

## 2024-07-31 ENCOUNTER — Other Ambulatory Visit: Payer: Self-pay

## 2024-07-31 ENCOUNTER — Encounter: Payer: Self-pay | Admitting: Family Medicine

## 2024-07-31 DIAGNOSIS — I10 Essential (primary) hypertension: Secondary | ICD-10-CM

## 2024-07-31 DIAGNOSIS — E7800 Pure hypercholesterolemia, unspecified: Secondary | ICD-10-CM

## 2024-08-03 ENCOUNTER — Telehealth: Payer: Self-pay | Admitting: Family Medicine

## 2024-08-03 ENCOUNTER — Other Ambulatory Visit: Payer: Self-pay

## 2024-08-03 MED ORDER — OLMESARTAN MEDOXOMIL 40 MG PO TABS: 40.0000 mg | ORAL_TABLET | Freq: Every day | ORAL | 1 refills | Status: DC

## 2024-08-03 NOTE — Telephone Encounter (Signed)
 [facility] faxed refill request for the following medications:   olmesartan  (BENICAR ) 40 MG tablet    Please advise.

## 2024-08-03 NOTE — Telephone Encounter (Signed)
 Medication has been sent in to [facility].

## 2024-08-04 MED ORDER — ATORVASTATIN CALCIUM 40 MG PO TABS: 40.0000 mg | ORAL_TABLET | Freq: Every day | ORAL | 2 refills | Status: AC

## 2024-08-04 MED ORDER — OLMESARTAN MEDOXOMIL 40 MG PO TABS: 40.0000 mg | ORAL_TABLET | Freq: Every day | ORAL | 3 refills | Status: AC

## 2024-08-04 MED ORDER — AMLODIPINE BESYLATE 5 MG PO TABS: 5.0000 mg | ORAL_TABLET | Freq: Every day | ORAL | 3 refills | Status: AC

## 2024-08-04 NOTE — Telephone Encounter (Signed)
 Please send Rx for 90 day supply with 1 refill for all 3 of those medications to [facility] to make sure he can get future refills. Thanks!

## 2024-08-10 NOTE — Progress Notes (Signed)
 Virtual Visit via Video Note  I connected with Bobby Little on 08/17/24 at 11:30 AM EST by a video enabled telemedicine application and verified that I am speaking with the correct person using two identifiers.  Location: Patient: home Provider: home office Persons participated in the visit- patient, provider    I discussed the limitations of evaluation and management by telemedicine and the availability of in person appointments. The patient expressed understanding and agreed to proceed.    I discussed the assessment and treatment plan with the patient. The patient was provided an opportunity to ask questions and all were answered. The patient agreed with the plan and demonstrated an understanding of the instructions.   The patient was advised to call back or seek an in-person evaluation if the symptoms worsen or if the condition fails to improve as anticipated.   Katheren Sleet, MD    [facility] MD/PA/NP OP Progress Note  08/17/2024 12:04 PM Bobby Little  MRN:  [id number]  Chief Complaint:  Chief Complaint  Patient presents with   Follow-up   HPI:  This is a follow-up appointment for anxiety, benzodiazepine dependence, insomnia.  He states that he has not slept since starting this morning as the heat went out.  He feels tired.  He has back issues and has difficulty in walking and standing.  He reports it is so frustrating that he has not been able to do things which he was able to do last year.  He is seeing many providers, receiving shot, and was recommended against the surgery.  They cannot do anything about it.  He has been trying to take it 1 day at a time, stating that it is nothing serious.  He had a okay Christmas.  He was unable to see his children as they live far.  However, he has been communicating with them and denies much concern.  He states that he started to feel funny if he were to take clonazepam  later in the day.  He denies any tremors.  He denies feeling depressed.  He  sleeps at 9 PM through 4 AM.  Although he feels refreshed, he wishes to sleep longer.  He has snoring when he sleeps in recliner due to his back issues.  He denies SI.  He denies any dizziness or fall.  He agrees with the plans as outlined below.   190 lbs Wt Readings from Last 3 Encounters:  06/08/24 209 lb (94.8 kg)  04/09/24 203 lb 6.4 oz (92.3 kg)  04/06/24 204 lb 11.2 oz (92.9 kg)     Support: wife Household:  wife Marital status:married for nine years with [place], divorced before Number of children: 2 boys ([place], [place]), 1 step son (in [place]) Employment: retired 2010,  Education:   He grew up in [place] .  He reports great relationships with both of his parents.  His mother died at 70 year old.   Substance use   Tobacco Alcohol Other substances/  Current Cigar now and then denies denies  Past   denies denies  Past Treatment             Visit Diagnosis:    ICD-10-CM   1. Adjustment disorder with anxiety  F43.22     2. Insomnia, unspecified type  G47.00     3. Benzodiazepine dependence (HCC)  F13.20     4. High risk medication use  Z79.899       Past Psychiatric History: Please see initial evaluation for full details. I have  reviewed the history. No updates at this time.     Past Medical History:  Past Medical History:  Diagnosis Date   Anxiety    x 20 y per pt   Arthritis    Degenerative disc disease, lumbar    Depression    Diverticulitis    Family history of polyps in the colon    Gastrointestinal stromal tumor (GIST) (HCC)    GIST (gastrointestinal stroma tumor), malignant, colon (HCC)    GIST (gastrointestinal stroma tumor), malignant, colon (HCC)    Hay fever/allergies    Hepatic cyst    History of spinal fusion for scoliosis    Hypertension    Neuromuscular disorder (HCC) ?????   Numbness in feet   Polycystic kidney    Prediabetes    Spinal stenosis of lumbar region     Past Surgical History:  Procedure Laterality Date    COLONOSCOPY WITH PROPOFOL  N/A 11/22/2016   Procedure: COLONOSCOPY WITH PROPOFOL ;  Surgeon: Ruel Kung, MD;  Location: [facility] ENDOSCOPY;  Service: Endoscopy;  Laterality: N/A;   COLONOSCOPY WITH PROPOFOL  N/A 11/10/2021   Procedure: COLONOSCOPY WITH PROPOFOL ;  Surgeon: Kung Ruel, MD;  Location: [facility] ENDOSCOPY;  Service: Gastroenterology;  Laterality: N/A;   KNEE ARTHROCENTESIS Right    PANCREAS SURGERY  10/2013   PARTIAL GASTRECTOMY     REMOVAL OF GASTROINTESTINAL STOMATIC  TUMOR OF STOMACH  10/2013   SPLENECTOMY  10/2013   TUMOR REMOVAL     stomach, slpeen, and pancreas    Family Psychiatric History: Please see initial evaluation for full details. I have reviewed the history. No updates at this time.     Family History:  Family History  Problem Relation Age of Onset   Hypertension Mother    Lymphoma Mother    Cancer Mother    Hypertension Father    Valvular heart disease Father    Dementia Father    Arthritis Father    Stroke Maternal Grandfather    Prostate cancer Paternal Grandfather    Stroke Paternal Grandfather    Coronary artery disease Sister    Coronary artery disease Brother    Anxiety disorder Son    Colon cancer Neg Hx     Social History:  Social History   Socioeconomic History   Marital status: Married    Spouse name: Not on file   Number of children: 2   Years of education: Not on file   Highest education level: Bachelor's degree (e.g., BA, AB, BS)  Occupational History   Occupation: retired  Tobacco Use   Smoking status: Former    Types: Cigars, Cigarettes    Quit date: 07/29/2021    Years since quitting: 3.0   Smokeless tobacco: Never   Tobacco comments:    1 cigar 3-4 times year  Vaping Use   Vaping status: Never Used  Substance and Sexual Activity   Alcohol use: No   Drug use: No   Sexual activity: Not Currently    Partners: Female  Other Topics Concern   Not on file  Social History Narrative   Not on file   Social Drivers of Health    Tobacco Use: Medium Risk (08/17/2024)   Patient History    Smoking Tobacco Use: Former    Smokeless Tobacco Use: Never    Passive Exposure: Not on file  Financial Resource Strain: Medium Risk (01/19/2024)   Overall Financial Resource Strain (CARDIA)    Difficulty of Paying Living Expenses: Somewhat hard  Food Insecurity: Patient Declined (01/19/2024)  Epic    Worried About Programme Researcher, Broadcasting/film/video in the Last Year: Patient declined    Barista in the Last Year: Patient declined  Transportation Needs: No Transportation Needs (01/19/2024)   Epic    Lack of Transportation (Medical): No    Lack of Transportation (Non-Medical): No  Physical Activity: Unknown (01/19/2024)   Exercise Vital Sign    Days of Exercise per Week: Patient declined    Minutes of Exercise per Session: Not on file  Stress: No Stress Concern Present (01/19/2024)   [employer] of Occupational Health - Occupational Stress Questionnaire    Feeling of Stress: Only a little  Social Connections: Unknown (01/19/2024)   Social Connection and Isolation Panel    Frequency of Communication with Friends and Family: Twice a week    Frequency of Social Gatherings with Friends and Family: Patient declined    Attends Religious Services: More than 4 times per year    Active Member of Clubs or Organizations: Patient declined    Attends Banker Meetings: Not on file    Marital Status: Married  Depression (PHQ2-9): Low Risk (06/08/2024)   Depression (PHQ2-9)    PHQ-2 Score: 1  Alcohol Screen: Low Risk (07/30/2022)   Alcohol Screen    Last Alcohol Screening Score (AUDIT): 0  Housing: Low Risk (01/19/2024)   Epic    Unable to Pay for Housing in the Last Year: No    Number of Times Moved in the Last Year: 0    Homeless in the Last Year: No  Utilities: Not At Risk (04/12/2022)   AHC Utilities    Threatened with loss of utilities: No  Health Literacy: Not on file    Allergies: Allergies[1]  Metabolic Disorder  Labs: Lab Results  Component Value Date   HGBA1C 6.0 (H) 04/06/2024   No results found for: PROLACTIN Lab Results  Component Value Date   CHOL 193 04/06/2024   TRIG 116 04/06/2024   HDL 56 04/06/2024   CHOLHDL 2.9 09/27/2023   LDLCALC 116 (H) 04/06/2024   LDLCALC 82 09/27/2023   Lab Results  Component Value Date   TSH 3.140 04/03/2021    Therapeutic Level Labs: No results found for: LITHIUM No results found for: VALPROATE No results found for: CBMZ  Current Medications: Current Outpatient Medications  Medication Sig Dispense Refill   Doxepin  HCl 6 MG TABS Take 1 tablet (6 mg total) by mouth at bedtime as needed (insomnia). 30 tablet 1   acetaminophen (TYLENOL) 500 MG tablet Take 500 mg by mouth.     amLODipine  (NORVASC ) 5 MG tablet Take 1 tablet (5 mg total) by mouth daily. 90 tablet 3   atorvastatin  (LIPITOR) 40 MG tablet Take 1 tablet (40 mg total) by mouth daily. 90 tablet 2   clonazePAM  (KLONOPIN ) 0.5 MG tablet Take 1 tablet (0.5 mg total) by mouth daily as needed for anxiety. 30 tablet 0   [START ON 08/27/2024] clonazePAM  (KLONOPIN ) 0.5 MG tablet Take 1 tablet (0.5 mg total) by mouth at bedtime. 30 tablet 1   [START ON 08/27/2024] clonazePAM  (KLONOPIN ) 1 MG tablet Take 1 tablet (1 mg total) by mouth daily. 30 tablet 1   ibuprofen (ADVIL) 200 MG tablet Take 200 mg by mouth.     lidocaine  (LMX) 4 % cream Apply 1 Application topically as needed.     naproxen sodium (ALEVE) 220 MG tablet Take 220 mg by mouth daily as needed. 3 times weekly     olmesartan  (  BENICAR ) 40 MG tablet Take 1 tablet (40 mg total) by mouth daily. 90 tablet 3   No current facility-administered medications for this visit.     Musculoskeletal: Strength & Muscle Tone: N/A Gait & Station: N/A Patient leans: N/A  Psychiatric Specialty Exam: Review of Systems  Psychiatric/Behavioral:  Positive for sleep disturbance. Negative for agitation, behavioral problems, confusion, decreased  concentration, dysphoric mood, hallucinations, self-injury and suicidal ideas. The patient is nervous/anxious. The patient is not hyperactive.   All other systems reviewed and are negative.   There were no vitals taken for this visit.There is no height or weight on file to calculate BMI.  General Appearance: Well Groomed  Eye Contact:  Good  Speech:  Clear and Coherent  Volume:  Normal  Mood:  ok  Affect:  Appropriate, Congruent, and calm  Thought Process:  Coherent  Orientation:  Full (Time, Place, and Person)  Thought Content: Logical   Suicidal Thoughts:  No  Homicidal Thoughts:  No  Memory:  Immediate;   Good  Judgement:  Good  Insight:  Good  Psychomotor Activity:  Normal  Concentration:  Concentration: Good and Attention Span: Good  Recall:  Good  Fund of Knowledge: Good  Language: Good  Akathisia:  No  Handed:  Right  AIMS (if indicated): not done  Assets:  Communication Skills Desire for Improvement  ADL's:  Intact  Cognition: WNL  Sleep:  Fair   Screenings: GAD-7    Garment/textile Technologist Visit from 06/08/2024 in [facility] Office Visit from 02/25/2024 in [facility] Visit from 01/23/2024 in [facility] Visit from 09/27/2023 in [facility] Visit from 05/02/2023 in [facility]  Total GAD-7 Score 2 2 8 4 4    PHQ2-9    Flowsheet Row Office Visit from 06/08/2024 in [facility] Office Visit from 02/25/2024 in [facility] Office Visit from 01/23/2024 in [facility] Office Visit from 09/27/2023 in [facility] Office Visit from 05/02/2023 in [facility]  PHQ-2 Total Score 1 1 2 3 1   PHQ-9 Total Score -- -- 4 8 9    Flowsheet Row Admission  (Discharged) from 11/10/2021 in [facility]  C-SSRS RISK CATEGORY No Risk     Assessment and Plan:  Bobby Little is a 79 year old male with a history of hypertension, CKD (eGFR 71 09/2023), 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   1. Adjustment disorder with anxiety The patient has been on clonazepam  for approximately 30 years, prescribed by Dr. Daniel for management of chronic anxiety, tinnitus, and insomnia secondary to pain. He has a psychosocial history notable for significant stress related to a past divorce and ongoing financial strain, and chronic back pain.  History: no admission. Originally on clonazepam  1 mg BID  Although he reports demoralization related to his back pain, he denies significant mood symptoms otherwise since being back on original dose of clonazepam .  Will continue current dose of clonazepam  to target anxiety, other somatic symptoms including tinnitus, and work on intervention for sleep as outlined below.   2. Insomnia, unspecified type - reports snoring on recliner.  Sleep study in [place] was reportedly non significant years ago Although he reports restorative sleep, he wishes to sleep longer.  While there is a concern of possible sleep apnea/snoring when he sleeps in recliner, he is not interested in sleep evaluation.  Will try low-dose doxepin  to see if it optimize the sleep.  Discussed potential risk of drowsiness.  Noted that he reports adverse reaction from duloxetine ; he was advised to contact office if he experiences any concerning side effect.   3. Benzodiazepine dependence (HCC) - on clonazepam  for over 30 years, which was started for tinnitus. No known history of seizures  4. High risk medication use He  had a worsening in tinnitus when he tried to taper down this medication.  He now reports anxiety associated with tapering down clonazepam . His ENT provider is reportedly aware of his condition, and he denies any additional intervention from them.  Will maintain on the current dose of clonazepam  at this time while working on insomnia.  Provided psychoeducation at length regarding the risk of drowsiness, fall, dependence and tolerance.     3. High risk medication use - UDS negative 02/2024   He expressed understanding that clonazepam  will not be ordered if any concern of misuse of this medication, or any other substance use.      Plan Start doxepin  6 mg at night as needed for insomnia Continue clonazepam  1 mg daily, 0.5 mg at night (0.5 mg BID caused worsening in tinnitus, insomnia, headache) Next appointment:  3/10 at 11:30, IP  Past trials of medication: sertraline, venlafaxine (he felt dealing was easier than taking medication), Buspar  (fuzzy headed), gabapentin  (limited benefit) , trazodone    The patient demonstrates the following risk factors for suicide: Chronic risk factors for suicide include: psychiatric disorder of anxiety. Acute risk factors for suicide include: unemployment. Protective factors for this patient include: positive social support, coping skills, and hope for the future. Considering these factors, the overall suicide risk at this point appears to be low. Patient is appropriate for outpatient follow up.   Collaboration of Care: Collaboration of Care: Other reviewed notes in Epic  Patient/Guardian was advised Release of Information must be obtained prior to any record release in order to collaborate their care with an outside provider. Patient/Guardian was advised if they have not already done so to contact the registration department to sign all necessary forms in order for us  to release information regarding their care.   Consent: Patient/Guardian gives verbal consent for  treatment and assignment of benefits for services provided during this visit. Patient/Guardian expressed understanding and agreed to proceed.    Katheren Sleet, MD 08/17/2024, 12:04 PM     [1]  Allergies Allergen Reactions   Beta Adrenergic Blockers     Dizziness, low heart rate   Codeine Nausea And Vomiting   Trazodone  And Nefazodone Other (See Comments)    insomnia

## 2024-08-17 ENCOUNTER — Encounter: Payer: Self-pay | Admitting: Psychiatry

## 2024-08-17 ENCOUNTER — Telehealth: Admitting: Psychiatry

## 2024-08-17 DIAGNOSIS — Z79899 Other long term (current) drug therapy: Secondary | ICD-10-CM | POA: Diagnosis not present

## 2024-08-17 DIAGNOSIS — G4700 Insomnia, unspecified: Secondary | ICD-10-CM | POA: Diagnosis not present

## 2024-08-17 DIAGNOSIS — F1320 Sedative, hypnotic or anxiolytic dependence, uncomplicated: Secondary | ICD-10-CM | POA: Diagnosis not present

## 2024-08-17 DIAGNOSIS — F4322 Adjustment disorder with anxiety: Secondary | ICD-10-CM

## 2024-08-17 MED ORDER — CLONAZEPAM 0.5 MG PO TABS: 0.5000 mg | ORAL_TABLET | Freq: Every day | ORAL | 1 refills | Status: AC

## 2024-08-17 MED ORDER — DOXEPIN HCL 6 MG PO TABS: 6.0000 mg | ORAL_TABLET | Freq: Every evening | ORAL | 1 refills | Status: AC | PRN

## 2024-08-17 MED ORDER — CLONAZEPAM 1 MG PO TABS: 1.0000 mg | ORAL_TABLET | Freq: Every day | ORAL | 1 refills | Status: AC

## 2024-08-17 NOTE — Patient Instructions (Signed)
 Start doxepin  6 mg at night as needed for insomnia Continue clonazepam  1 mg daily, 0.5 mg at night Next appointment:  3/10 at 11:30

## 2024-09-29 ENCOUNTER — Ambulatory Visit: Admitting: Psychiatry

## 2024-10-06 ENCOUNTER — Ambulatory Visit: Admitting: Family Medicine

## 2024-10-12 ENCOUNTER — Ambulatory Visit: Admitting: Family Medicine

## 2024-10-13 ENCOUNTER — Ambulatory Visit: Admitting: Family Medicine
